# Patient Record
Sex: Female | Born: 1940
Health system: Southern US, Community
[De-identification: ages and names within clinical notes are randomized; demographics above are authoritative.]

## PROBLEM LIST (undated history)

## (undated) DIAGNOSIS — Z1211 Encounter for screening for malignant neoplasm of colon: Secondary | ICD-10-CM

## (undated) DIAGNOSIS — Z1389 Encounter for screening for other disorder: Secondary | ICD-10-CM

## (undated) DIAGNOSIS — K649 Unspecified hemorrhoids: Secondary | ICD-10-CM

## (undated) DIAGNOSIS — I519 Heart disease, unspecified: Secondary | ICD-10-CM

## (undated) DIAGNOSIS — M1990 Unspecified osteoarthritis, unspecified site: Secondary | ICD-10-CM

## (undated) DIAGNOSIS — Z87891 Personal history of nicotine dependence: Secondary | ICD-10-CM

## (undated) DIAGNOSIS — G4730 Sleep apnea, unspecified: Secondary | ICD-10-CM

## (undated) DIAGNOSIS — Z800 Family history of malignant neoplasm of digestive organs: Secondary | ICD-10-CM

## (undated) DIAGNOSIS — M810 Age-related osteoporosis without current pathological fracture: Secondary | ICD-10-CM

## (undated) DIAGNOSIS — E669 Obesity, unspecified: Secondary | ICD-10-CM

## (undated) DIAGNOSIS — K449 Diaphragmatic hernia without obstruction or gangrene: Secondary | ICD-10-CM

## (undated) DIAGNOSIS — I4892 Unspecified atrial flutter: Secondary | ICD-10-CM

## (undated) DIAGNOSIS — N6019 Diffuse cystic mastopathy of unspecified breast: Secondary | ICD-10-CM

## (undated) DIAGNOSIS — Z1239 Encounter for other screening for malignant neoplasm of breast: Secondary | ICD-10-CM

## (undated) DIAGNOSIS — I10 Essential (primary) hypertension: Secondary | ICD-10-CM

## (undated) DIAGNOSIS — E785 Hyperlipidemia, unspecified: Secondary | ICD-10-CM

## (undated) DIAGNOSIS — I4891 Unspecified atrial fibrillation: Secondary | ICD-10-CM

## (undated) HISTORY — DX: Unspecified hemorrhoids: K64.9

## (undated) HISTORY — DX: Encounter for other screening for malignant neoplasm of breast: Z12.39

## (undated) HISTORY — PX: COLONOSCOPY: SHX174

## (undated) HISTORY — DX: Encounter for screening for other disorder: Z13.89

## (undated) HISTORY — PX: CHOLECYSTECTOMY: SHX55

## (undated) HISTORY — DX: Sleep apnea, unspecified: G47.30

## (undated) HISTORY — DX: Hyperlipidemia, unspecified: E78.5

## (undated) HISTORY — PX: ABDOMINAL HYSTERECTOMY: SHX81

## (undated) HISTORY — PX: CARDIOVERSION: SHX1299

## (undated) HISTORY — DX: Personal history of nicotine dependence: Z87.891

## (undated) HISTORY — DX: Family history of malignant neoplasm of digestive organs: Z80.0

## (undated) HISTORY — DX: Unspecified atrial flutter: I48.92

## (undated) HISTORY — DX: Age-related osteoporosis without current pathological fracture: M81.0

## (undated) HISTORY — DX: Unspecified atrial fibrillation: I48.91

## (undated) HISTORY — DX: Diffuse cystic mastopathy of unspecified breast: N60.19

## (undated) HISTORY — DX: Obesity, unspecified: E66.9

## (undated) HISTORY — DX: Unspecified osteoarthritis, unspecified site: M19.90

## (undated) HISTORY — DX: Essential (primary) hypertension: I10

## (undated) HISTORY — DX: Encounter for screening for malignant neoplasm of colon: Z12.11

## (undated) HISTORY — PX: INSERT / REPLACE / REMOVE PACEMAKER: SUR710

---

## 1972-05-15 HISTORY — PX: SALPINGOOPHORECTOMY: SHX82

## 2000-05-15 DIAGNOSIS — I10 Essential (primary) hypertension: Secondary | ICD-10-CM

## 2000-05-15 HISTORY — DX: Essential (primary) hypertension: I10

## 2004-06-27 ENCOUNTER — Ambulatory Visit: Payer: Self-pay | Admitting: General Surgery

## 2004-10-05 ENCOUNTER — Ambulatory Visit: Payer: Self-pay | Admitting: Internal Medicine

## 2005-07-18 ENCOUNTER — Ambulatory Visit: Payer: Self-pay | Admitting: General Surgery

## 2006-07-19 ENCOUNTER — Ambulatory Visit: Payer: Self-pay | Admitting: General Surgery

## 2007-05-16 DIAGNOSIS — I4892 Unspecified atrial flutter: Secondary | ICD-10-CM

## 2007-05-16 DIAGNOSIS — K649 Unspecified hemorrhoids: Secondary | ICD-10-CM

## 2007-05-16 HISTORY — DX: Unspecified hemorrhoids: K64.9

## 2007-05-16 HISTORY — DX: Unspecified atrial flutter: I48.92

## 2007-07-09 ENCOUNTER — Ambulatory Visit: Payer: Self-pay | Admitting: General Surgery

## 2007-07-09 ENCOUNTER — Other Ambulatory Visit: Payer: Self-pay

## 2007-07-10 ENCOUNTER — Ambulatory Visit: Payer: Self-pay | Admitting: Cardiology

## 2007-07-10 ENCOUNTER — Ambulatory Visit: Payer: Self-pay | Admitting: Family Medicine

## 2007-07-22 ENCOUNTER — Ambulatory Visit: Payer: Self-pay | Admitting: General Surgery

## 2007-08-30 ENCOUNTER — Ambulatory Visit: Payer: Self-pay | Admitting: General Surgery

## 2007-09-06 ENCOUNTER — Ambulatory Visit: Payer: Self-pay | Admitting: General Surgery

## 2007-09-06 HISTORY — PX: HEMORRHOID SURGERY: SHX153

## 2007-12-18 ENCOUNTER — Inpatient Hospital Stay: Payer: Self-pay | Admitting: Internal Medicine

## 2007-12-18 ENCOUNTER — Other Ambulatory Visit: Payer: Self-pay

## 2007-12-19 ENCOUNTER — Other Ambulatory Visit: Payer: Self-pay

## 2008-01-29 ENCOUNTER — Ambulatory Visit: Payer: Self-pay | Admitting: Internal Medicine

## 2008-01-29 ENCOUNTER — Other Ambulatory Visit: Payer: Self-pay

## 2008-05-28 ENCOUNTER — Ambulatory Visit: Payer: Self-pay | Admitting: Internal Medicine

## 2008-07-22 ENCOUNTER — Ambulatory Visit: Payer: Self-pay | Admitting: General Surgery

## 2008-10-09 ENCOUNTER — Ambulatory Visit: Payer: Self-pay | Admitting: General Surgery

## 2009-07-23 ENCOUNTER — Ambulatory Visit: Payer: Self-pay | Admitting: General Surgery

## 2010-07-25 ENCOUNTER — Ambulatory Visit: Payer: Self-pay | Admitting: General Surgery

## 2011-05-23 DIAGNOSIS — I10 Essential (primary) hypertension: Secondary | ICD-10-CM | POA: Diagnosis not present

## 2011-05-23 DIAGNOSIS — Z0000 Encounter for general adult medical examination without abnormal findings: Secondary | ICD-10-CM | POA: Diagnosis not present

## 2011-05-23 DIAGNOSIS — K219 Gastro-esophageal reflux disease without esophagitis: Secondary | ICD-10-CM | POA: Diagnosis not present

## 2011-05-23 DIAGNOSIS — N393 Stress incontinence (female) (male): Secondary | ICD-10-CM | POA: Diagnosis not present

## 2011-05-23 DIAGNOSIS — M159 Polyosteoarthritis, unspecified: Secondary | ICD-10-CM | POA: Diagnosis not present

## 2011-05-24 DIAGNOSIS — G4710 Hypersomnia, unspecified: Secondary | ICD-10-CM | POA: Diagnosis not present

## 2011-05-24 DIAGNOSIS — G4730 Sleep apnea, unspecified: Secondary | ICD-10-CM | POA: Diagnosis not present

## 2011-05-31 DIAGNOSIS — G4710 Hypersomnia, unspecified: Secondary | ICD-10-CM | POA: Diagnosis not present

## 2011-05-31 DIAGNOSIS — G4720 Circadian rhythm sleep disorder, unspecified type: Secondary | ICD-10-CM | POA: Diagnosis not present

## 2011-05-31 DIAGNOSIS — G4730 Sleep apnea, unspecified: Secondary | ICD-10-CM | POA: Diagnosis not present

## 2011-05-31 DIAGNOSIS — R0602 Shortness of breath: Secondary | ICD-10-CM | POA: Diagnosis not present

## 2011-06-08 DIAGNOSIS — G4710 Hypersomnia, unspecified: Secondary | ICD-10-CM | POA: Diagnosis not present

## 2011-06-08 DIAGNOSIS — G4720 Circadian rhythm sleep disorder, unspecified type: Secondary | ICD-10-CM | POA: Diagnosis not present

## 2011-06-22 DIAGNOSIS — G4730 Sleep apnea, unspecified: Secondary | ICD-10-CM | POA: Diagnosis not present

## 2011-06-22 DIAGNOSIS — G4720 Circadian rhythm sleep disorder, unspecified type: Secondary | ICD-10-CM | POA: Diagnosis not present

## 2011-06-22 DIAGNOSIS — R0602 Shortness of breath: Secondary | ICD-10-CM | POA: Diagnosis not present

## 2011-06-28 DIAGNOSIS — G4730 Sleep apnea, unspecified: Secondary | ICD-10-CM | POA: Diagnosis not present

## 2011-06-28 DIAGNOSIS — G4720 Circadian rhythm sleep disorder, unspecified type: Secondary | ICD-10-CM | POA: Diagnosis not present

## 2011-06-28 DIAGNOSIS — G4710 Hypersomnia, unspecified: Secondary | ICD-10-CM | POA: Diagnosis not present

## 2011-07-26 ENCOUNTER — Ambulatory Visit: Payer: Self-pay | Admitting: General Surgery

## 2011-07-26 DIAGNOSIS — Z1231 Encounter for screening mammogram for malignant neoplasm of breast: Secondary | ICD-10-CM | POA: Diagnosis not present

## 2011-08-03 DIAGNOSIS — Z800 Family history of malignant neoplasm of digestive organs: Secondary | ICD-10-CM | POA: Diagnosis not present

## 2011-08-03 DIAGNOSIS — N6019 Diffuse cystic mastopathy of unspecified breast: Secondary | ICD-10-CM | POA: Diagnosis not present

## 2011-08-30 DIAGNOSIS — G4730 Sleep apnea, unspecified: Secondary | ICD-10-CM | POA: Diagnosis not present

## 2011-08-30 DIAGNOSIS — G4710 Hypersomnia, unspecified: Secondary | ICD-10-CM | POA: Diagnosis not present

## 2011-09-05 DIAGNOSIS — I10 Essential (primary) hypertension: Secondary | ICD-10-CM | POA: Diagnosis not present

## 2011-09-05 DIAGNOSIS — I059 Rheumatic mitral valve disease, unspecified: Secondary | ICD-10-CM | POA: Diagnosis not present

## 2011-09-05 DIAGNOSIS — I495 Sick sinus syndrome: Secondary | ICD-10-CM | POA: Diagnosis not present

## 2011-09-13 HISTORY — PX: PACEMAKER INSERTION: SHX728

## 2011-09-14 DIAGNOSIS — I059 Rheumatic mitral valve disease, unspecified: Secondary | ICD-10-CM | POA: Diagnosis not present

## 2011-09-14 DIAGNOSIS — R0602 Shortness of breath: Secondary | ICD-10-CM | POA: Diagnosis not present

## 2011-09-14 DIAGNOSIS — I209 Angina pectoris, unspecified: Secondary | ICD-10-CM | POA: Diagnosis not present

## 2011-09-21 DIAGNOSIS — E782 Mixed hyperlipidemia: Secondary | ICD-10-CM | POA: Diagnosis not present

## 2011-09-21 DIAGNOSIS — I495 Sick sinus syndrome: Secondary | ICD-10-CM | POA: Diagnosis not present

## 2011-09-21 DIAGNOSIS — I10 Essential (primary) hypertension: Secondary | ICD-10-CM | POA: Diagnosis not present

## 2011-09-21 DIAGNOSIS — G4730 Sleep apnea, unspecified: Secondary | ICD-10-CM | POA: Diagnosis not present

## 2011-09-22 ENCOUNTER — Ambulatory Visit: Payer: Self-pay | Admitting: Cardiology

## 2011-09-22 DIAGNOSIS — I495 Sick sinus syndrome: Secondary | ICD-10-CM | POA: Diagnosis not present

## 2011-09-22 DIAGNOSIS — R0602 Shortness of breath: Secondary | ICD-10-CM | POA: Diagnosis not present

## 2011-09-22 DIAGNOSIS — R918 Other nonspecific abnormal finding of lung field: Secondary | ICD-10-CM | POA: Diagnosis not present

## 2011-09-22 DIAGNOSIS — Z01812 Encounter for preprocedural laboratory examination: Secondary | ICD-10-CM | POA: Diagnosis not present

## 2011-09-22 DIAGNOSIS — Z01810 Encounter for preprocedural cardiovascular examination: Secondary | ICD-10-CM | POA: Diagnosis not present

## 2011-09-22 DIAGNOSIS — Z01811 Encounter for preprocedural respiratory examination: Secondary | ICD-10-CM | POA: Diagnosis not present

## 2011-09-22 LAB — BASIC METABOLIC PANEL
Calcium, Total: 8.9 mg/dL (ref 8.5–10.1)
Chloride: 108 mmol/L — ABNORMAL HIGH (ref 98–107)
Creatinine: 1.1 mg/dL (ref 0.60–1.30)
EGFR (Non-African Amer.): 52 — ABNORMAL LOW
Osmolality: 279 (ref 275–301)
Potassium: 4.1 mmol/L (ref 3.5–5.1)

## 2011-09-22 LAB — CBC WITH DIFFERENTIAL/PLATELET
Basophil #: 0.1 10*3/uL (ref 0.0–0.1)
Eosinophil #: 0.2 10*3/uL (ref 0.0–0.7)
Eosinophil %: 2.4 %
HCT: 37.6 % (ref 35.0–47.0)
HGB: 12.5 g/dL (ref 12.0–16.0)
Lymphocyte #: 2.2 10*3/uL (ref 1.0–3.6)
Lymphocyte %: 30.2 %
MCH: 29.2 pg (ref 26.0–34.0)
MCHC: 33.1 g/dL (ref 32.0–36.0)
Monocyte #: 0.6 x10 3/mm (ref 0.2–0.9)
Neutrophil #: 4.3 10*3/uL (ref 1.4–6.5)
Platelet: 171 10*3/uL (ref 150–440)
RDW: 14.5 % (ref 11.5–14.5)
WBC: 7.3 10*3/uL (ref 3.6–11.0)

## 2011-09-22 LAB — PROTIME-INR: INR: 0.8

## 2011-09-22 LAB — APTT: Activated PTT: 32.9 secs (ref 23.6–35.9)

## 2011-09-25 DIAGNOSIS — G4730 Sleep apnea, unspecified: Secondary | ICD-10-CM | POA: Diagnosis not present

## 2011-09-25 DIAGNOSIS — Z79899 Other long term (current) drug therapy: Secondary | ICD-10-CM | POA: Diagnosis not present

## 2011-09-25 DIAGNOSIS — I10 Essential (primary) hypertension: Secondary | ICD-10-CM | POA: Diagnosis not present

## 2011-09-25 DIAGNOSIS — I495 Sick sinus syndrome: Secondary | ICD-10-CM | POA: Diagnosis not present

## 2011-09-25 DIAGNOSIS — E785 Hyperlipidemia, unspecified: Secondary | ICD-10-CM | POA: Diagnosis not present

## 2011-09-25 DIAGNOSIS — I4891 Unspecified atrial fibrillation: Secondary | ICD-10-CM | POA: Diagnosis not present

## 2011-09-25 DIAGNOSIS — Z7982 Long term (current) use of aspirin: Secondary | ICD-10-CM | POA: Diagnosis not present

## 2011-09-26 ENCOUNTER — Ambulatory Visit: Payer: Self-pay | Admitting: Cardiology

## 2011-09-26 DIAGNOSIS — I10 Essential (primary) hypertension: Secondary | ICD-10-CM | POA: Diagnosis not present

## 2011-09-26 DIAGNOSIS — R918 Other nonspecific abnormal finding of lung field: Secondary | ICD-10-CM | POA: Diagnosis not present

## 2011-09-26 DIAGNOSIS — Z79899 Other long term (current) drug therapy: Secondary | ICD-10-CM | POA: Diagnosis not present

## 2011-09-26 DIAGNOSIS — G4730 Sleep apnea, unspecified: Secondary | ICD-10-CM | POA: Diagnosis not present

## 2011-09-26 DIAGNOSIS — Z7982 Long term (current) use of aspirin: Secondary | ICD-10-CM | POA: Diagnosis not present

## 2011-09-26 DIAGNOSIS — I4892 Unspecified atrial flutter: Secondary | ICD-10-CM | POA: Diagnosis not present

## 2011-09-26 DIAGNOSIS — I495 Sick sinus syndrome: Secondary | ICD-10-CM | POA: Diagnosis not present

## 2011-09-26 DIAGNOSIS — I4891 Unspecified atrial fibrillation: Secondary | ICD-10-CM | POA: Diagnosis not present

## 2011-09-27 DIAGNOSIS — I10 Essential (primary) hypertension: Secondary | ICD-10-CM | POA: Diagnosis not present

## 2011-09-27 DIAGNOSIS — Z7982 Long term (current) use of aspirin: Secondary | ICD-10-CM | POA: Diagnosis not present

## 2011-09-27 DIAGNOSIS — Z79899 Other long term (current) drug therapy: Secondary | ICD-10-CM | POA: Diagnosis not present

## 2011-09-27 DIAGNOSIS — I495 Sick sinus syndrome: Secondary | ICD-10-CM | POA: Diagnosis not present

## 2011-09-27 DIAGNOSIS — I4891 Unspecified atrial fibrillation: Secondary | ICD-10-CM | POA: Diagnosis not present

## 2011-09-27 DIAGNOSIS — G4730 Sleep apnea, unspecified: Secondary | ICD-10-CM | POA: Diagnosis not present

## 2011-10-11 DIAGNOSIS — I495 Sick sinus syndrome: Secondary | ICD-10-CM | POA: Diagnosis not present

## 2011-10-11 DIAGNOSIS — I10 Essential (primary) hypertension: Secondary | ICD-10-CM | POA: Diagnosis not present

## 2011-10-11 DIAGNOSIS — G4730 Sleep apnea, unspecified: Secondary | ICD-10-CM | POA: Diagnosis not present

## 2011-10-11 DIAGNOSIS — E782 Mixed hyperlipidemia: Secondary | ICD-10-CM | POA: Diagnosis not present

## 2011-10-18 DIAGNOSIS — G479 Sleep disorder, unspecified: Secondary | ICD-10-CM | POA: Diagnosis not present

## 2011-10-18 DIAGNOSIS — G4720 Circadian rhythm sleep disorder, unspecified type: Secondary | ICD-10-CM | POA: Diagnosis not present

## 2011-10-18 DIAGNOSIS — G4730 Sleep apnea, unspecified: Secondary | ICD-10-CM | POA: Diagnosis not present

## 2011-10-26 DIAGNOSIS — I495 Sick sinus syndrome: Secondary | ICD-10-CM | POA: Diagnosis not present

## 2011-10-26 DIAGNOSIS — I10 Essential (primary) hypertension: Secondary | ICD-10-CM | POA: Diagnosis not present

## 2011-11-20 DIAGNOSIS — G479 Sleep disorder, unspecified: Secondary | ICD-10-CM | POA: Diagnosis not present

## 2011-11-20 DIAGNOSIS — M899 Disorder of bone, unspecified: Secondary | ICD-10-CM | POA: Diagnosis not present

## 2011-11-20 DIAGNOSIS — K219 Gastro-esophageal reflux disease without esophagitis: Secondary | ICD-10-CM | POA: Diagnosis not present

## 2011-11-20 DIAGNOSIS — I10 Essential (primary) hypertension: Secondary | ICD-10-CM | POA: Diagnosis not present

## 2011-11-20 DIAGNOSIS — F5102 Adjustment insomnia: Secondary | ICD-10-CM | POA: Diagnosis not present

## 2011-11-20 DIAGNOSIS — I4891 Unspecified atrial fibrillation: Secondary | ICD-10-CM | POA: Diagnosis not present

## 2011-12-06 DIAGNOSIS — G4730 Sleep apnea, unspecified: Secondary | ICD-10-CM | POA: Diagnosis not present

## 2011-12-06 DIAGNOSIS — G4710 Hypersomnia, unspecified: Secondary | ICD-10-CM | POA: Diagnosis not present

## 2012-01-09 DIAGNOSIS — I10 Essential (primary) hypertension: Secondary | ICD-10-CM | POA: Diagnosis not present

## 2012-01-09 DIAGNOSIS — M899 Disorder of bone, unspecified: Secondary | ICD-10-CM | POA: Diagnosis not present

## 2012-01-09 DIAGNOSIS — F5102 Adjustment insomnia: Secondary | ICD-10-CM | POA: Diagnosis not present

## 2012-01-09 DIAGNOSIS — I4891 Unspecified atrial fibrillation: Secondary | ICD-10-CM | POA: Diagnosis not present

## 2012-01-09 DIAGNOSIS — G479 Sleep disorder, unspecified: Secondary | ICD-10-CM | POA: Diagnosis not present

## 2012-01-09 DIAGNOSIS — K219 Gastro-esophageal reflux disease without esophagitis: Secondary | ICD-10-CM | POA: Diagnosis not present

## 2012-01-09 DIAGNOSIS — M949 Disorder of cartilage, unspecified: Secondary | ICD-10-CM | POA: Diagnosis not present

## 2012-01-24 DIAGNOSIS — J309 Allergic rhinitis, unspecified: Secondary | ICD-10-CM | POA: Diagnosis not present

## 2012-01-24 DIAGNOSIS — G4730 Sleep apnea, unspecified: Secondary | ICD-10-CM | POA: Diagnosis not present

## 2012-01-24 DIAGNOSIS — G4720 Circadian rhythm sleep disorder, unspecified type: Secondary | ICD-10-CM | POA: Diagnosis not present

## 2012-02-05 DIAGNOSIS — E782 Mixed hyperlipidemia: Secondary | ICD-10-CM | POA: Diagnosis not present

## 2012-02-05 DIAGNOSIS — I059 Rheumatic mitral valve disease, unspecified: Secondary | ICD-10-CM | POA: Diagnosis not present

## 2012-02-05 DIAGNOSIS — I4891 Unspecified atrial fibrillation: Secondary | ICD-10-CM | POA: Diagnosis not present

## 2012-02-05 DIAGNOSIS — I10 Essential (primary) hypertension: Secondary | ICD-10-CM | POA: Diagnosis not present

## 2012-02-22 DIAGNOSIS — Z23 Encounter for immunization: Secondary | ICD-10-CM | POA: Diagnosis not present

## 2012-03-06 DIAGNOSIS — G4730 Sleep apnea, unspecified: Secondary | ICD-10-CM | POA: Diagnosis not present

## 2012-04-23 DIAGNOSIS — I495 Sick sinus syndrome: Secondary | ICD-10-CM | POA: Diagnosis not present

## 2012-04-25 DIAGNOSIS — I4892 Unspecified atrial flutter: Secondary | ICD-10-CM | POA: Diagnosis not present

## 2012-04-25 DIAGNOSIS — I10 Essential (primary) hypertension: Secondary | ICD-10-CM | POA: Diagnosis not present

## 2012-04-25 DIAGNOSIS — R040 Epistaxis: Secondary | ICD-10-CM | POA: Diagnosis not present

## 2012-04-25 DIAGNOSIS — K219 Gastro-esophageal reflux disease without esophagitis: Secondary | ICD-10-CM | POA: Diagnosis not present

## 2012-05-16 DIAGNOSIS — R040 Epistaxis: Secondary | ICD-10-CM | POA: Diagnosis not present

## 2012-05-28 DIAGNOSIS — I10 Essential (primary) hypertension: Secondary | ICD-10-CM | POA: Diagnosis not present

## 2012-05-28 DIAGNOSIS — Z79899 Other long term (current) drug therapy: Secondary | ICD-10-CM | POA: Diagnosis not present

## 2012-05-31 ENCOUNTER — Ambulatory Visit: Payer: Self-pay | Admitting: Internal Medicine

## 2012-05-31 DIAGNOSIS — Z713 Dietary counseling and surveillance: Secondary | ICD-10-CM | POA: Diagnosis not present

## 2012-05-31 DIAGNOSIS — G4730 Sleep apnea, unspecified: Secondary | ICD-10-CM | POA: Diagnosis not present

## 2012-05-31 DIAGNOSIS — E669 Obesity, unspecified: Secondary | ICD-10-CM | POA: Diagnosis not present

## 2012-05-31 DIAGNOSIS — Z950 Presence of cardiac pacemaker: Secondary | ICD-10-CM | POA: Diagnosis not present

## 2012-05-31 DIAGNOSIS — I10 Essential (primary) hypertension: Secondary | ICD-10-CM | POA: Diagnosis not present

## 2012-06-06 DIAGNOSIS — G479 Sleep disorder, unspecified: Secondary | ICD-10-CM | POA: Diagnosis not present

## 2012-06-06 DIAGNOSIS — K219 Gastro-esophageal reflux disease without esophagitis: Secondary | ICD-10-CM | POA: Diagnosis not present

## 2012-06-06 DIAGNOSIS — I10 Essential (primary) hypertension: Secondary | ICD-10-CM | POA: Diagnosis not present

## 2012-06-06 DIAGNOSIS — F5102 Adjustment insomnia: Secondary | ICD-10-CM | POA: Diagnosis not present

## 2012-06-06 DIAGNOSIS — I4891 Unspecified atrial fibrillation: Secondary | ICD-10-CM | POA: Diagnosis not present

## 2012-06-06 DIAGNOSIS — M899 Disorder of bone, unspecified: Secondary | ICD-10-CM | POA: Diagnosis not present

## 2012-06-06 DIAGNOSIS — M949 Disorder of cartilage, unspecified: Secondary | ICD-10-CM | POA: Diagnosis not present

## 2012-06-15 ENCOUNTER — Ambulatory Visit: Payer: Self-pay | Admitting: Internal Medicine

## 2012-06-22 ENCOUNTER — Encounter: Payer: Self-pay | Admitting: General Surgery

## 2012-07-05 DIAGNOSIS — I4891 Unspecified atrial fibrillation: Secondary | ICD-10-CM | POA: Diagnosis not present

## 2012-07-10 DIAGNOSIS — G4710 Hypersomnia, unspecified: Secondary | ICD-10-CM | POA: Diagnosis not present

## 2012-07-15 DIAGNOSIS — E2839 Other primary ovarian failure: Secondary | ICD-10-CM | POA: Diagnosis not present

## 2012-07-26 ENCOUNTER — Ambulatory Visit: Payer: Self-pay | Admitting: General Surgery

## 2012-07-26 DIAGNOSIS — Z1231 Encounter for screening mammogram for malignant neoplasm of breast: Secondary | ICD-10-CM | POA: Diagnosis not present

## 2012-08-01 DIAGNOSIS — Z006 Encounter for examination for normal comparison and control in clinical research program: Secondary | ICD-10-CM | POA: Diagnosis not present

## 2012-08-01 DIAGNOSIS — G4730 Sleep apnea, unspecified: Secondary | ICD-10-CM | POA: Diagnosis not present

## 2012-08-01 DIAGNOSIS — G4710 Hypersomnia, unspecified: Secondary | ICD-10-CM | POA: Diagnosis not present

## 2012-08-01 DIAGNOSIS — I279 Pulmonary heart disease, unspecified: Secondary | ICD-10-CM | POA: Diagnosis not present

## 2012-08-01 DIAGNOSIS — R040 Epistaxis: Secondary | ICD-10-CM | POA: Diagnosis not present

## 2012-08-01 DIAGNOSIS — R05 Cough: Secondary | ICD-10-CM | POA: Diagnosis not present

## 2012-08-01 DIAGNOSIS — I10 Essential (primary) hypertension: Secondary | ICD-10-CM | POA: Diagnosis not present

## 2012-08-06 ENCOUNTER — Ambulatory Visit (INDEPENDENT_AMBULATORY_CARE_PROVIDER_SITE_OTHER): Payer: Medicare Other | Admitting: General Surgery

## 2012-08-06 ENCOUNTER — Encounter: Payer: Self-pay | Admitting: General Surgery

## 2012-08-06 VITALS — BP 120/74 | HR 68 | Resp 14 | Ht 66.0 in | Wt 212.0 lb

## 2012-08-06 DIAGNOSIS — N6019 Diffuse cystic mastopathy of unspecified breast: Secondary | ICD-10-CM | POA: Diagnosis not present

## 2012-08-06 DIAGNOSIS — Z800 Family history of malignant neoplasm of digestive organs: Secondary | ICD-10-CM

## 2012-08-06 DIAGNOSIS — Z1231 Encounter for screening mammogram for malignant neoplasm of breast: Secondary | ICD-10-CM | POA: Diagnosis not present

## 2012-08-06 NOTE — Progress Notes (Signed)
Patient ID: Cathy Curtis, female   DOB: 23-Jul-1940, 72 y.o.   MRN: [id number]  Chief Complaint  Patient presents with  . Follow-up    bilateral mammogram     HPI Cathy Curtis is a 72 y.o. female.  The patient present for a follow up bilateral screening mammogram. Most recent mammogram was done on 07/26/12 at [facility] with a birad category 2. Patient has a history of fibrocystic breast disease. Patient has a strong family history of colon cancer including her father and 2 sisters. Patient denies any problems at this time with her breasts.   HPI  Past Medical History  Diagnosis Date  . Unspecified essential hypertension 2002  . Personal history of tobacco use, presenting hazards to health   . Diffuse cystic mastopathy     FCD  . Osteoporosis   . Sleep apnea     admits to c-pap  . Family history of malignant neoplasm of gastrointestinal tract   . Breast screening, unspecified   . Special screening for malignant neoplasms, colon   . Obesity, unspecified   . Screening for obesity   . Atrial flutter 2009  . Hemorrhoids 2009    resolved    Past Surgical History  Procedure Laterality Date  . Cardioversion  2009, 2010  . Hemorrhoid surgery  09-06-2007    stapled  . Salpingoophorectomy  1974  . Abdominal hysterectomy    . Colonoscopy  2010    Dr. Evette Cristal, [facility]  . Pacemaker insertion Left 09/2011    Family History  Problem Relation Age of Onset  . Colon cancer Father   . Colon cancer Sister   . Colon cancer Sister     Social History History  Substance Use Topics  . Smoking status: Former Smoker -- 1.00 packs/day for 20 years    Types: Cigarettes    Quit date: 05/16/1991  . Smokeless tobacco: Never Used  . Alcohol Use: Yes     Comment: occasionally    Allergies  Allergen Reactions  . Prevacid (Lansoprazole) Other (See Comments)    whelps    Current Outpatient Prescriptions  Medication Sig Dispense Refill  . amLODipine (NORVASC) 5 MG tablet Take 5 mg by mouth  daily.      Marland Kitchen aspirin 325 MG tablet Take 325 mg by mouth daily.      Marland Kitchen CALCIUM-VITAMIN D PO Take by mouth.      . Cholecalciferol (VITAMIN D-3) 1000 UNITS CAPS Take by mouth daily.      Marland Kitchen estradiol (ESTRACE) 0.5 MG tablet Take 0.5 mg by mouth every other day.      Marland Kitchen omeprazole (PRILOSEC) 20 MG capsule Take 20 mg by mouth daily.      . solifenacin (VESICARE) 5 MG tablet Take 10 mg by mouth daily.      Marland Kitchen zolpidem (AMBIEN CR) 12.5 MG CR tablet Take 12.5 mg by mouth at bedtime as needed for sleep.       No current facility-administered medications for this visit.    Review of Systems Review of Systems  Constitutional: Negative.   Respiratory: Negative.   Cardiovascular: Negative.     Blood pressure 120/74, pulse 68, resp. rate 14, height 5\' 6"  (1.676 m), weight 212 lb (96.163 kg).  Physical Exam Physical Exam  Constitutional: She appears well-developed and well-nourished.  Neck: Normal range of motion.  Cardiovascular: Normal rate and normal heart sounds.  An irregularly irregular rhythm present.  Pulmonary/Chest: Effort normal and breath sounds normal. Right breast exhibits  no inverted nipple, no mass, no nipple discharge, no skin change and no tenderness. Left breast exhibits no inverted nipple, no mass, no nipple discharge, no skin change and no tenderness.  Abdominal: Soft. Bowel sounds are normal.    Data Reviewed Mammogram reviewed Birads2   Assessment    Stable exam    Plan    Mammogram in one year , colonoscopy in one year.       Greta Doom F 08/06/2012, 12:06 PM

## 2012-08-08 ENCOUNTER — Encounter: Payer: Self-pay | Admitting: General Surgery

## 2012-08-08 DIAGNOSIS — I10 Essential (primary) hypertension: Secondary | ICD-10-CM | POA: Diagnosis not present

## 2012-08-08 DIAGNOSIS — I4891 Unspecified atrial fibrillation: Secondary | ICD-10-CM | POA: Diagnosis not present

## 2012-08-08 DIAGNOSIS — R5381 Other malaise: Secondary | ICD-10-CM | POA: Diagnosis not present

## 2012-08-22 DIAGNOSIS — I4891 Unspecified atrial fibrillation: Secondary | ICD-10-CM | POA: Diagnosis not present

## 2012-08-22 DIAGNOSIS — I10 Essential (primary) hypertension: Secondary | ICD-10-CM | POA: Diagnosis not present

## 2012-08-22 DIAGNOSIS — E785 Hyperlipidemia, unspecified: Secondary | ICD-10-CM | POA: Diagnosis not present

## 2012-08-22 DIAGNOSIS — G4730 Sleep apnea, unspecified: Secondary | ICD-10-CM | POA: Diagnosis not present

## 2012-08-27 DIAGNOSIS — H2510 Age-related nuclear cataract, unspecified eye: Secondary | ICD-10-CM | POA: Diagnosis not present

## 2012-10-15 DIAGNOSIS — N3941 Urge incontinence: Secondary | ICD-10-CM | POA: Diagnosis not present

## 2012-10-15 DIAGNOSIS — E559 Vitamin D deficiency, unspecified: Secondary | ICD-10-CM | POA: Diagnosis not present

## 2012-10-15 DIAGNOSIS — J309 Allergic rhinitis, unspecified: Secondary | ICD-10-CM | POA: Diagnosis not present

## 2012-10-15 DIAGNOSIS — R350 Frequency of micturition: Secondary | ICD-10-CM | POA: Diagnosis not present

## 2012-10-15 DIAGNOSIS — K219 Gastro-esophageal reflux disease without esophagitis: Secondary | ICD-10-CM | POA: Diagnosis not present

## 2012-10-15 DIAGNOSIS — I10 Essential (primary) hypertension: Secondary | ICD-10-CM | POA: Diagnosis not present

## 2012-10-15 DIAGNOSIS — R300 Dysuria: Secondary | ICD-10-CM | POA: Diagnosis not present

## 2012-10-15 DIAGNOSIS — R5381 Other malaise: Secondary | ICD-10-CM | POA: Diagnosis not present

## 2012-10-16 DIAGNOSIS — G4730 Sleep apnea, unspecified: Secondary | ICD-10-CM | POA: Diagnosis not present

## 2012-10-16 DIAGNOSIS — G4710 Hypersomnia, unspecified: Secondary | ICD-10-CM | POA: Diagnosis not present

## 2013-02-04 DIAGNOSIS — R0602 Shortness of breath: Secondary | ICD-10-CM | POA: Diagnosis not present

## 2013-02-04 DIAGNOSIS — I279 Pulmonary heart disease, unspecified: Secondary | ICD-10-CM | POA: Diagnosis not present

## 2013-02-04 DIAGNOSIS — G4710 Hypersomnia, unspecified: Secondary | ICD-10-CM | POA: Diagnosis not present

## 2013-02-12 DIAGNOSIS — J309 Allergic rhinitis, unspecified: Secondary | ICD-10-CM | POA: Diagnosis not present

## 2013-02-12 DIAGNOSIS — R5381 Other malaise: Secondary | ICD-10-CM | POA: Diagnosis not present

## 2013-02-12 DIAGNOSIS — E2839 Other primary ovarian failure: Secondary | ICD-10-CM | POA: Diagnosis not present

## 2013-02-12 DIAGNOSIS — I10 Essential (primary) hypertension: Secondary | ICD-10-CM | POA: Diagnosis not present

## 2013-02-12 DIAGNOSIS — R609 Edema, unspecified: Secondary | ICD-10-CM | POA: Diagnosis not present

## 2013-02-12 DIAGNOSIS — F5102 Adjustment insomnia: Secondary | ICD-10-CM | POA: Diagnosis not present

## 2013-02-13 DIAGNOSIS — Z23 Encounter for immunization: Secondary | ICD-10-CM | POA: Diagnosis not present

## 2013-03-04 DIAGNOSIS — I4891 Unspecified atrial fibrillation: Secondary | ICD-10-CM | POA: Diagnosis not present

## 2013-03-04 DIAGNOSIS — Z950 Presence of cardiac pacemaker: Secondary | ICD-10-CM | POA: Diagnosis not present

## 2013-03-04 DIAGNOSIS — E785 Hyperlipidemia, unspecified: Secondary | ICD-10-CM | POA: Diagnosis not present

## 2013-03-04 DIAGNOSIS — I10 Essential (primary) hypertension: Secondary | ICD-10-CM | POA: Diagnosis not present

## 2013-03-14 DIAGNOSIS — J309 Allergic rhinitis, unspecified: Secondary | ICD-10-CM | POA: Diagnosis not present

## 2013-03-14 DIAGNOSIS — R609 Edema, unspecified: Secondary | ICD-10-CM | POA: Diagnosis not present

## 2013-03-14 DIAGNOSIS — I10 Essential (primary) hypertension: Secondary | ICD-10-CM | POA: Diagnosis not present

## 2013-03-14 DIAGNOSIS — N393 Stress incontinence (female) (male): Secondary | ICD-10-CM | POA: Diagnosis not present

## 2013-04-09 DIAGNOSIS — R0602 Shortness of breath: Secondary | ICD-10-CM | POA: Diagnosis not present

## 2013-04-23 DIAGNOSIS — G4710 Hypersomnia, unspecified: Secondary | ICD-10-CM | POA: Diagnosis not present

## 2013-05-27 ENCOUNTER — Encounter: Payer: Self-pay | Admitting: General Surgery

## 2013-07-14 ENCOUNTER — Ambulatory Visit: Payer: Self-pay

## 2013-07-14 DIAGNOSIS — N393 Stress incontinence (female) (male): Secondary | ICD-10-CM | POA: Diagnosis not present

## 2013-07-14 DIAGNOSIS — G4730 Sleep apnea, unspecified: Secondary | ICD-10-CM | POA: Diagnosis not present

## 2013-07-14 DIAGNOSIS — I10 Essential (primary) hypertension: Secondary | ICD-10-CM | POA: Diagnosis not present

## 2013-07-14 DIAGNOSIS — G4710 Hypersomnia, unspecified: Secondary | ICD-10-CM | POA: Diagnosis not present

## 2013-07-14 DIAGNOSIS — M25569 Pain in unspecified knee: Secondary | ICD-10-CM | POA: Diagnosis not present

## 2013-07-14 DIAGNOSIS — IMO0002 Reserved for concepts with insufficient information to code with codable children: Secondary | ICD-10-CM | POA: Diagnosis not present

## 2013-07-14 DIAGNOSIS — M1710 Unilateral primary osteoarthritis, unspecified knee: Secondary | ICD-10-CM | POA: Diagnosis not present

## 2013-07-28 ENCOUNTER — Encounter: Payer: Self-pay | Admitting: General Surgery

## 2013-07-28 ENCOUNTER — Ambulatory Visit: Payer: Self-pay | Admitting: General Surgery

## 2013-07-28 DIAGNOSIS — Z1231 Encounter for screening mammogram for malignant neoplasm of breast: Secondary | ICD-10-CM | POA: Diagnosis not present

## 2013-07-30 DIAGNOSIS — G4730 Sleep apnea, unspecified: Secondary | ICD-10-CM | POA: Diagnosis not present

## 2013-07-30 DIAGNOSIS — G4710 Hypersomnia, unspecified: Secondary | ICD-10-CM | POA: Diagnosis not present

## 2013-08-05 DIAGNOSIS — G4720 Circadian rhythm sleep disorder, unspecified type: Secondary | ICD-10-CM | POA: Diagnosis not present

## 2013-08-05 DIAGNOSIS — M1710 Unilateral primary osteoarthritis, unspecified knee: Secondary | ICD-10-CM | POA: Diagnosis not present

## 2013-08-05 DIAGNOSIS — IMO0002 Reserved for concepts with insufficient information to code with codable children: Secondary | ICD-10-CM | POA: Diagnosis not present

## 2013-08-05 DIAGNOSIS — G4710 Hypersomnia, unspecified: Secondary | ICD-10-CM | POA: Diagnosis not present

## 2013-08-05 DIAGNOSIS — G4730 Sleep apnea, unspecified: Secondary | ICD-10-CM | POA: Diagnosis not present

## 2013-08-19 ENCOUNTER — Ambulatory Visit (INDEPENDENT_AMBULATORY_CARE_PROVIDER_SITE_OTHER): Payer: Medicare Other | Admitting: General Surgery

## 2013-08-19 ENCOUNTER — Encounter: Payer: Self-pay | Admitting: General Surgery

## 2013-08-19 VITALS — BP 172/70 | HR 82 | Resp 12 | Ht 66.0 in | Wt 217.0 lb

## 2013-08-19 DIAGNOSIS — I10 Essential (primary) hypertension: Secondary | ICD-10-CM | POA: Diagnosis not present

## 2013-08-19 DIAGNOSIS — Z800 Family history of malignant neoplasm of digestive organs: Secondary | ICD-10-CM

## 2013-08-19 DIAGNOSIS — N6019 Diffuse cystic mastopathy of unspecified breast: Secondary | ICD-10-CM | POA: Diagnosis not present

## 2013-08-19 DIAGNOSIS — Z1239 Encounter for other screening for malignant neoplasm of breast: Secondary | ICD-10-CM

## 2013-08-19 DIAGNOSIS — I4891 Unspecified atrial fibrillation: Secondary | ICD-10-CM | POA: Diagnosis not present

## 2013-08-19 DIAGNOSIS — Z950 Presence of cardiac pacemaker: Secondary | ICD-10-CM | POA: Diagnosis not present

## 2013-08-19 MED ORDER — POLYETHYLENE GLYCOL 3350 17 GM/SCOOP PO POWD: 1.0000 | Freq: Once | ORAL | Status: DC

## 2013-08-19 MED ORDER — POLYETHYLENE GLYCOL 3350 17 GM/SCOOP PO POWD: ORAL | Status: DC

## 2013-08-19 NOTE — Patient Instructions (Addendum)
Patient to return in 1 year with a bilateral screening mammogram. She is also to be scheduled for a colonoscopy. Continue self breast exams. Call office for any new breast issues or concerns.   Colonoscopy A colonoscopy is an exam to look at the entire large intestine (colon). This exam can help find problems such as tumors, polyps, inflammation, and areas of bleeding. The exam takes about 1 hour.  LET [facility] CARE PROVIDER KNOW ABOUT:   Any allergies you have.  All medicines you are taking, including vitamins, herbs, eye drops, creams, and over-the-counter medicines.  Previous problems you or members of your family have had with the use of anesthetics.  Any blood disorders you have.  Previous surgeries you have had.  Medical conditions you have. RISKS AND COMPLICATIONS  Generally, this is a safe procedure. However, as with any procedure, complications can occur. Possible complications include:  Bleeding.  Tearing or rupture of the colon wall.  Reaction to medicines given during the exam.  Infection (rare). BEFORE THE PROCEDURE   Ask your health care provider about changing or stopping your regular medicines.  You may be prescribed an oral bowel prep. This involves drinking a large amount of medicated liquid, starting the day before your procedure. The liquid will cause you to have multiple loose stools until your stool is almost clear or light green. This cleans out your colon in preparation for the procedure.  Do not eat or drink anything else once you have started the bowel prep, unless your health care provider tells you it is safe to do so.  Arrange for someone to drive you home after the procedure. PROCEDURE   You will be given medicine to help you relax (sedative).  You will lie on your side with your knees bent.  A long, flexible tube with a light and camera on the end (colonoscope) will be inserted through the rectum and into the colon. The camera sends video  back to a computer screen as it moves through the colon. The colonoscope also releases carbon dioxide gas to inflate the colon. This helps your health care provider see the area better.  During the exam, your health care provider may take a small tissue sample (biopsy) to be examined under a microscope if any abnormalities are found.  The exam is finished when the entire colon has been viewed. AFTER THE PROCEDURE   Do not drive for 24 hours after the exam.  You may have a small amount of blood in your stool.  You may pass moderate amounts of gas and have mild abdominal cramping or bloating. This is caused by the gas used to inflate your colon during the exam.  Ask when your test results will be ready and how you will get your results. Make sure you get your test results. Document Released: 04/28/2000 Document Revised: 02/19/2013 Document Reviewed: 01/06/2013 [facility] Patient Information 2014 [place].  Patient has been scheduled for a colonoscopy on 10-08-13 at [facility]. This patient has been asked to decrease current 325 mg aspirin to 81 mg aspirin starting one week prior to procedure.

## 2013-08-19 NOTE — Progress Notes (Signed)
Patient ID: Cathy Curtis, female   DOB: 1940/07/20, 73 y.o.   MRN: [id number]  Chief Complaint  Patient presents with  . Follow-up    mammogram and colonoscopy discussion    HPI Cathy Curtis is a 73 y.o. female who presents for a breast evaluation. The most recent mammogram was done on 07/28/13 at [facility]. Patient does not  perform regular self breast checks but does get regular mammograms done. She is also here to discuss having a colonoscopy done. Her last one was 10/09/2008. No current problems with the bowels.     HPI  Past Medical History  Diagnosis Date  . Unspecified essential hypertension 2002  . Personal history of tobacco use, presenting hazards to health   . Diffuse cystic mastopathy     FCD  . Osteoporosis   . Sleep apnea     admits to c-pap  . Family history of malignant neoplasm of gastrointestinal tract   . Breast screening, unspecified   . Special screening for malignant neoplasms, colon   . Obesity, unspecified   . Screening for obesity   . Atrial flutter 2009  . Hemorrhoids 2009    resolved  . Osteoarthritis     Past Surgical History  Procedure Laterality Date  . Cardioversion  2009, 2010  . Hemorrhoid surgery  09-06-2007    stapled  . Salpingoophorectomy  1974  . Abdominal hysterectomy    . Colonoscopy  2010    Dr. Jamal Collin, [facility]  . Pacemaker insertion Left 09/2011  . Cholecystectomy      Family History  Problem Relation Age of Onset  . Colon cancer Father   . Colon cancer Sister   . Colon cancer Sister     Social History History  Substance Use Topics  . Smoking status: Former Smoker -- 1.00 packs/day for 20 years    Types: Cigarettes    Quit date: 05/16/1991  . Smokeless tobacco: Never Used  . Alcohol Use: Yes     Comment: occasionally    Allergies  Allergen Reactions  . Prevacid [Lansoprazole] Other (See Comments)    whelps    Current Outpatient Prescriptions  Medication Sig Dispense Refill  . amLODipine (NORVASC) 5 MG tablet  Take 5 mg by mouth daily.      Marland Kitchen aspirin 325 MG tablet Take 325 mg by mouth daily.      Marland Kitchen CALCIUM-VITAMIN D PO Take by mouth.      . Cholecalciferol (VITAMIN D-3) 1000 UNITS CAPS Take by mouth daily.      . diclofenac sodium (VOLTAREN) 1 % GEL Apply topically daily.      Marland Kitchen estradiol (ESTRACE) 0.5 MG tablet Take 0.5 mg by mouth every other day.      . meloxicam (MOBIC) 7.5 MG tablet Take 7.5 mg by mouth 2 (two) times daily.      Marland Kitchen omeprazole (PRILOSEC) 20 MG capsule Take 20 mg by mouth daily.      Marland Kitchen zolpidem (AMBIEN CR) 12.5 MG CR tablet Take 12.5 mg by mouth at bedtime as needed for sleep.      . polyethylene glycol powder (GLYCOLAX/MIRALAX) powder 255 grams one bottle for colonoscopy prep  255 g  0   No current facility-administered medications for this visit.    Review of Systems Review of Systems  Constitutional: Negative.   Respiratory: Negative.   Cardiovascular: Negative.   Gastrointestinal: Negative.     Blood pressure 172/70, pulse 82, resp. rate 12, height 5\' 6"  (1.676  m), weight 217 lb (98.431 kg).  Physical Exam Physical Exam  Constitutional: She is oriented to person, place, and time. She appears well-developed and well-nourished.  Eyes: Conjunctivae are normal. No scleral icterus.  Neck: Neck supple. No thyromegaly present.  Cardiovascular: Normal rate, regular rhythm and normal heart sounds.   No murmur heard. Pulmonary/Chest: Effort normal and breath sounds normal. Right breast exhibits no inverted nipple, no mass, no nipple discharge, no skin change and no tenderness. Left breast exhibits no inverted nipple, no mass, no nipple discharge, no skin change and no tenderness.  Abdominal: Soft. Normal appearance and bowel sounds are normal. There is no hepatosplenomegaly. There is no tenderness. No hernia.  Lymphadenopathy:    She has no cervical adenopathy.    She has no axillary adenopathy.  Neurological: She is alert and oriented to person, place, and time.  Skin:  Skin is warm and dry.    Data Reviewed Mammogram-stable  Assessment    Stable exam. History of FCD. FH of colon cancer     Plan    71yr f/u with bilateral screening mammogram. Surveillance colonoscopy.    Patient has been scheduled for a colonoscopy on 10-08-13 at [facility]. This patient has been asked to decrease current 325 mg aspirin to 81 mg aspirin starting one week prior to procedure.   Ruston Fedora G 08/19/2013, 2:41 PM

## 2013-09-30 ENCOUNTER — Telehealth: Payer: Self-pay

## 2013-09-30 NOTE — Telephone Encounter (Signed)
Spoke with patient about her upcoming colonoscopy on 10/08/13. She is aware to reduce her aspirin to 81mg  one week prior. She has already pre registered with the hospital. She has had no changes in her health or medications. Patient is aware of date and all instructions.

## 2013-10-02 ENCOUNTER — Other Ambulatory Visit: Payer: Self-pay | Admitting: General Surgery

## 2013-10-02 DIAGNOSIS — Z800 Family history of malignant neoplasm of digestive organs: Secondary | ICD-10-CM

## 2013-10-08 ENCOUNTER — Ambulatory Visit: Payer: Self-pay | Admitting: General Surgery

## 2013-10-08 DIAGNOSIS — Z1211 Encounter for screening for malignant neoplasm of colon: Secondary | ICD-10-CM | POA: Diagnosis not present

## 2013-10-08 DIAGNOSIS — G4730 Sleep apnea, unspecified: Secondary | ICD-10-CM | POA: Diagnosis not present

## 2013-10-08 DIAGNOSIS — I10 Essential (primary) hypertension: Secondary | ICD-10-CM | POA: Diagnosis not present

## 2013-10-08 DIAGNOSIS — Z79899 Other long term (current) drug therapy: Secondary | ICD-10-CM | POA: Diagnosis not present

## 2013-10-08 DIAGNOSIS — Z7982 Long term (current) use of aspirin: Secondary | ICD-10-CM | POA: Diagnosis not present

## 2013-10-08 DIAGNOSIS — Z87891 Personal history of nicotine dependence: Secondary | ICD-10-CM | POA: Diagnosis not present

## 2013-10-08 DIAGNOSIS — E669 Obesity, unspecified: Secondary | ICD-10-CM | POA: Diagnosis not present

## 2013-10-08 DIAGNOSIS — Z950 Presence of cardiac pacemaker: Secondary | ICD-10-CM | POA: Diagnosis not present

## 2013-10-08 DIAGNOSIS — K5730 Diverticulosis of large intestine without perforation or abscess without bleeding: Secondary | ICD-10-CM

## 2013-10-08 DIAGNOSIS — Z800 Family history of malignant neoplasm of digestive organs: Secondary | ICD-10-CM | POA: Diagnosis not present

## 2013-10-08 DIAGNOSIS — Z888 Allergy status to other drugs, medicaments and biological substances status: Secondary | ICD-10-CM | POA: Diagnosis not present

## 2013-10-13 ENCOUNTER — Encounter: Payer: Self-pay | Admitting: General Surgery

## 2013-11-10 DIAGNOSIS — N3941 Urge incontinence: Secondary | ICD-10-CM | POA: Diagnosis not present

## 2013-11-10 DIAGNOSIS — I10 Essential (primary) hypertension: Secondary | ICD-10-CM | POA: Diagnosis not present

## 2013-11-10 DIAGNOSIS — E782 Mixed hyperlipidemia: Secondary | ICD-10-CM | POA: Diagnosis not present

## 2013-11-10 DIAGNOSIS — E559 Vitamin D deficiency, unspecified: Secondary | ICD-10-CM | POA: Diagnosis not present

## 2013-11-10 DIAGNOSIS — J309 Allergic rhinitis, unspecified: Secondary | ICD-10-CM | POA: Diagnosis not present

## 2013-11-10 DIAGNOSIS — G479 Sleep disorder, unspecified: Secondary | ICD-10-CM | POA: Diagnosis not present

## 2013-11-10 DIAGNOSIS — F5102 Adjustment insomnia: Secondary | ICD-10-CM | POA: Diagnosis not present

## 2013-11-10 DIAGNOSIS — Z0000 Encounter for general adult medical examination without abnormal findings: Secondary | ICD-10-CM | POA: Diagnosis not present

## 2013-12-07 IMAGING — MG MAM DGTL SCRN MAM NO ORDER W/CAD
1 series · 6 of 6 positions shown · non-contrast
Comparison: none

REASON FOR EXAM: SCR MAMMO NO ORDER
COMMENTS:  PLEASE SEND COPY TO DR. QUIRIJN AMAZIGH ALSO PER PT REQUEST.

PROCEDURE:     MAM - MAM DGTL SCRN MAM NO ORDER W/CAD  - July 26, 2011  [DATE]
RESULT:      No dominant masses or pathologic clustered calcifications are
demonstrated. A dense nodular parenchymal pattern is present with benign
calcification. CAD evaluation is nonfocal.

[R CC · right · 6 of 6 slices shown]
[im 1/6]
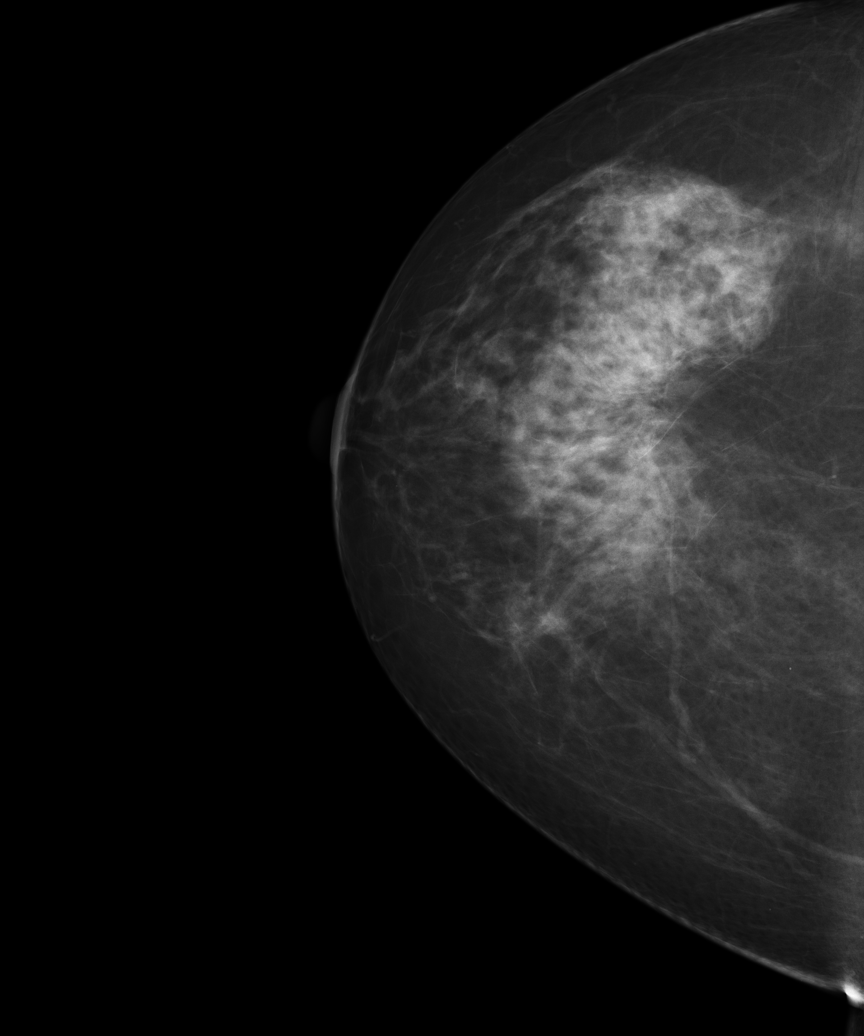
[im 2/6]
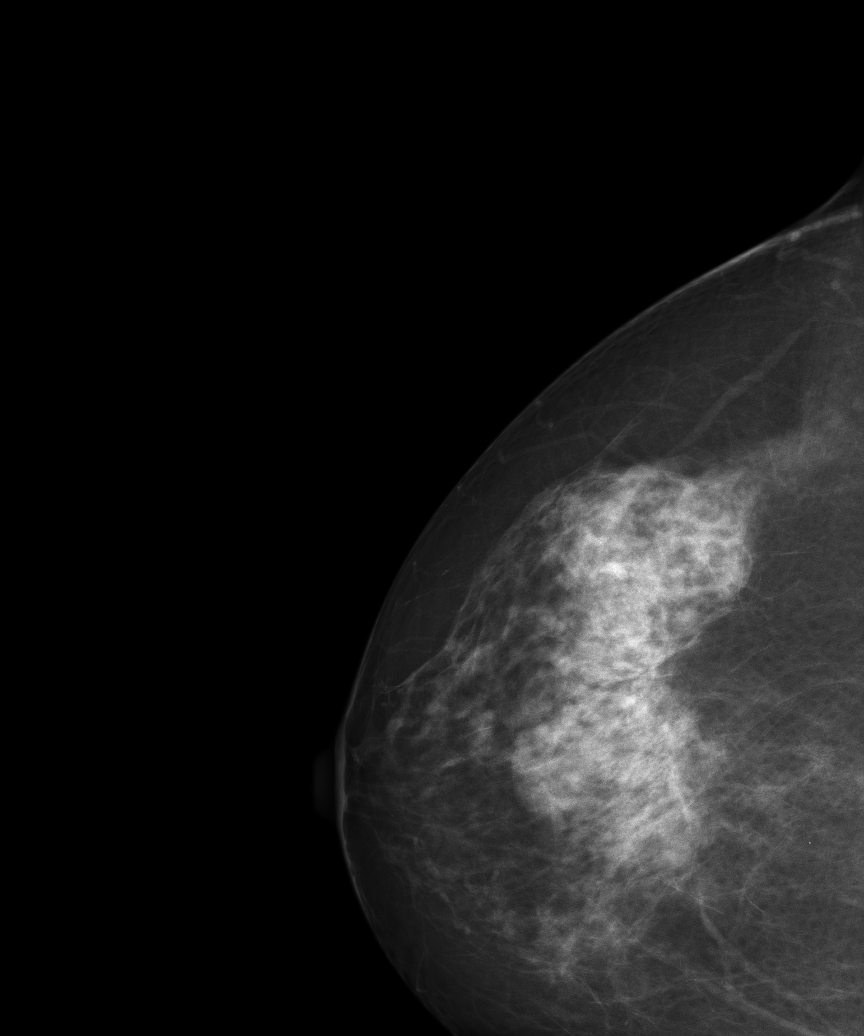
[im 3/6]
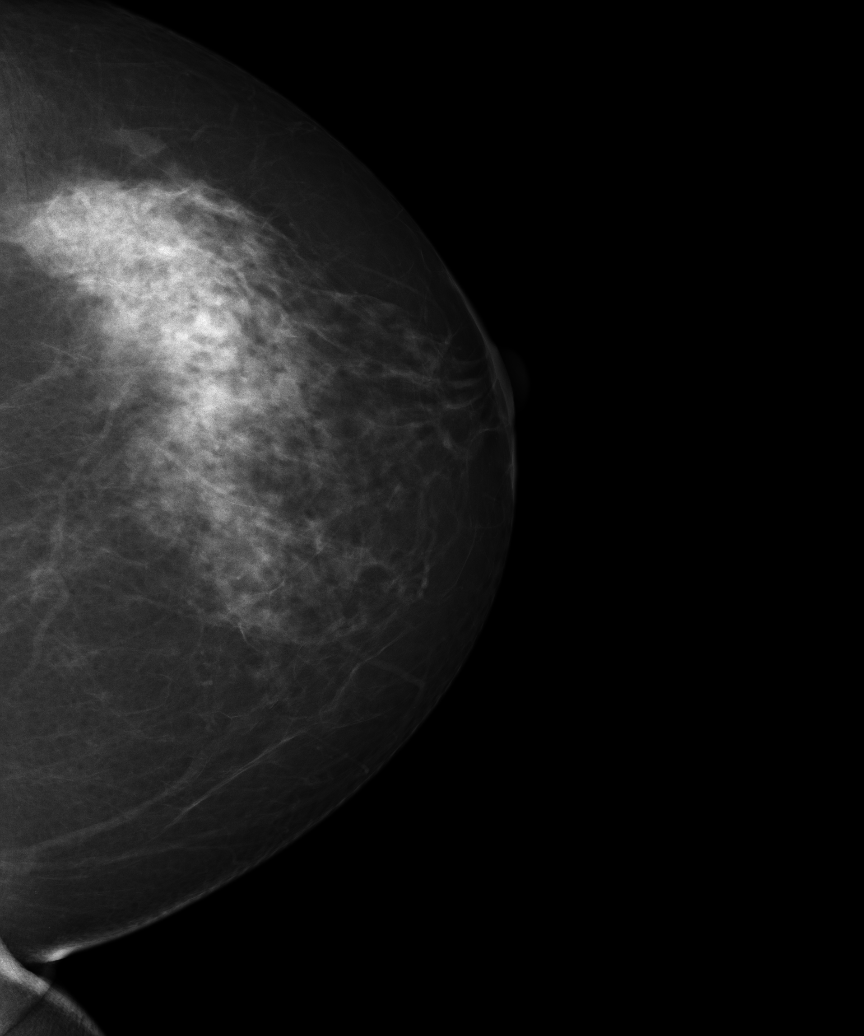
[im 4/6]
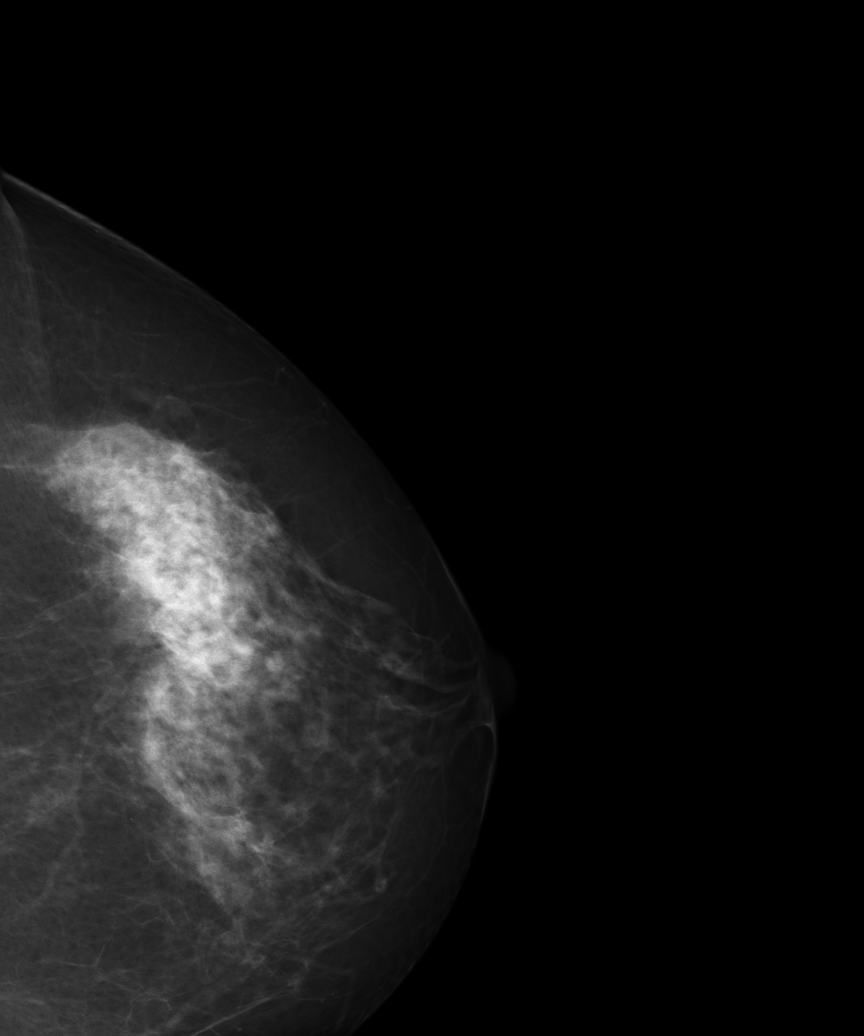
[im 5/6]
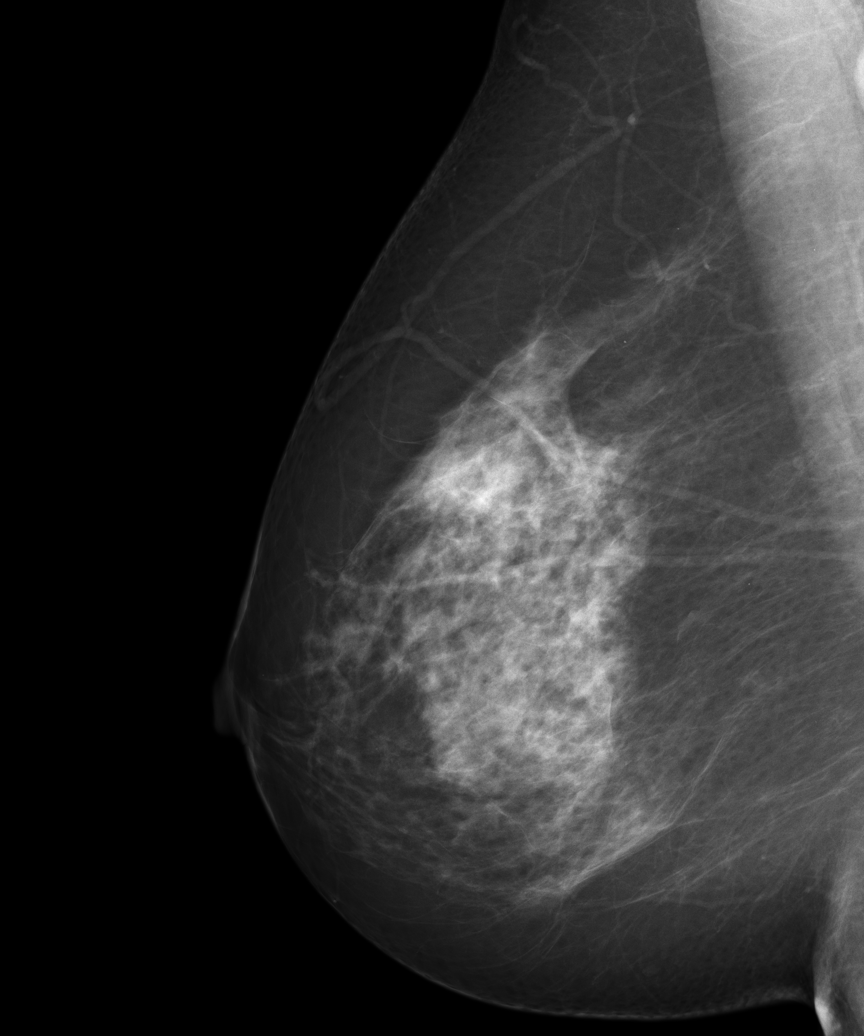
[im 6/6]
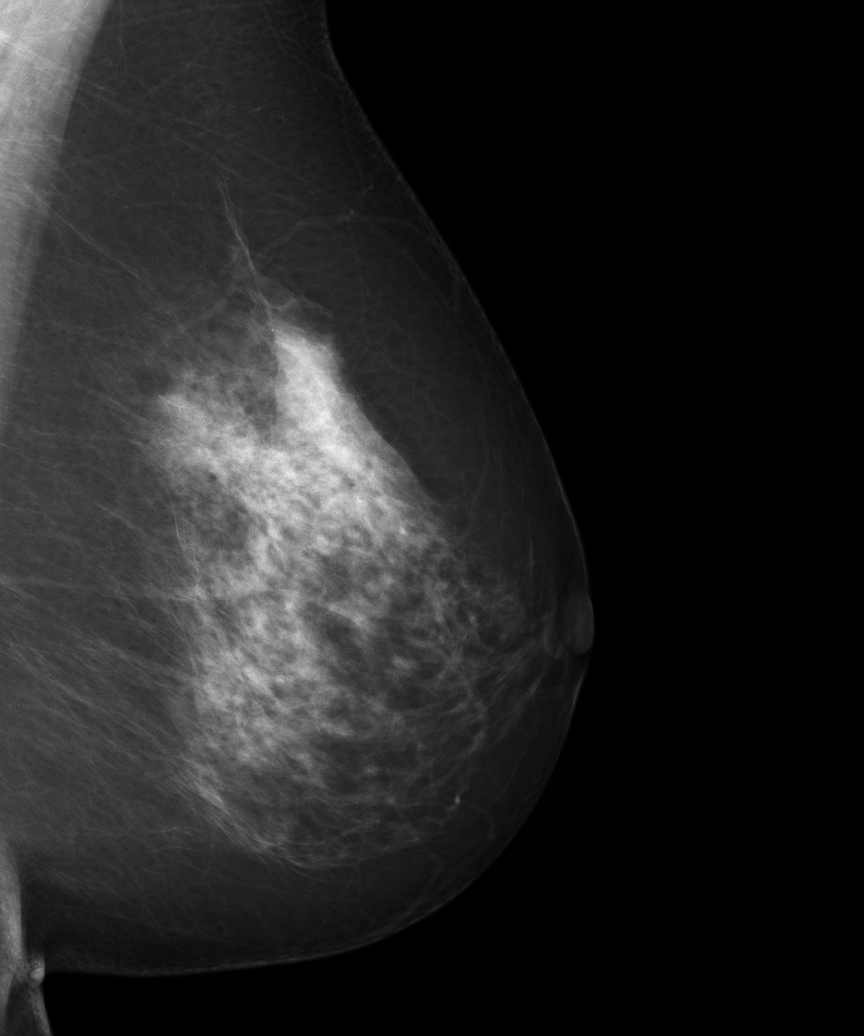

[6 of 6 positions shown; findings below may reference images not displayed]

IMPRESSION: 1.     Benign exam.
2.     Routine yearly follow up mammogram is suggested.
3.     BI-RADS:  Category 2- Benign Finding.

A negative mammogram report does not preclude biopsy or other evaluation of
a clinically palpable or otherwise suspicious mass or lesion.  Breast cancer
may not be detected by mammography in up to 10% of cases.

## 2014-01-28 DIAGNOSIS — G4730 Sleep apnea, unspecified: Secondary | ICD-10-CM | POA: Diagnosis not present

## 2014-01-28 DIAGNOSIS — G4710 Hypersomnia, unspecified: Secondary | ICD-10-CM | POA: Diagnosis not present

## 2014-02-03 DIAGNOSIS — G4730 Sleep apnea, unspecified: Secondary | ICD-10-CM | POA: Diagnosis not present

## 2014-02-03 DIAGNOSIS — G4710 Hypersomnia, unspecified: Secondary | ICD-10-CM | POA: Diagnosis not present

## 2014-02-03 DIAGNOSIS — G479 Sleep disorder, unspecified: Secondary | ICD-10-CM | POA: Diagnosis not present

## 2014-02-03 IMAGING — CR DG CHEST 2V
1 series · 2 of 2 positions shown · non-contrast
Comparison: none

REASON FOR EXAM: sob,htn,sleep apnea
COMMENTS:

PROCEDURE:     DXR - DXR CHEST PA (OR AP) AND LATERAL  - September 22, 2011  [DATE]
RESULT:     Comparison: 12/20/2007

[Series 1: w chest pa · 0.14mm/px · 2 of 2 slices shown]
[im 1/2]
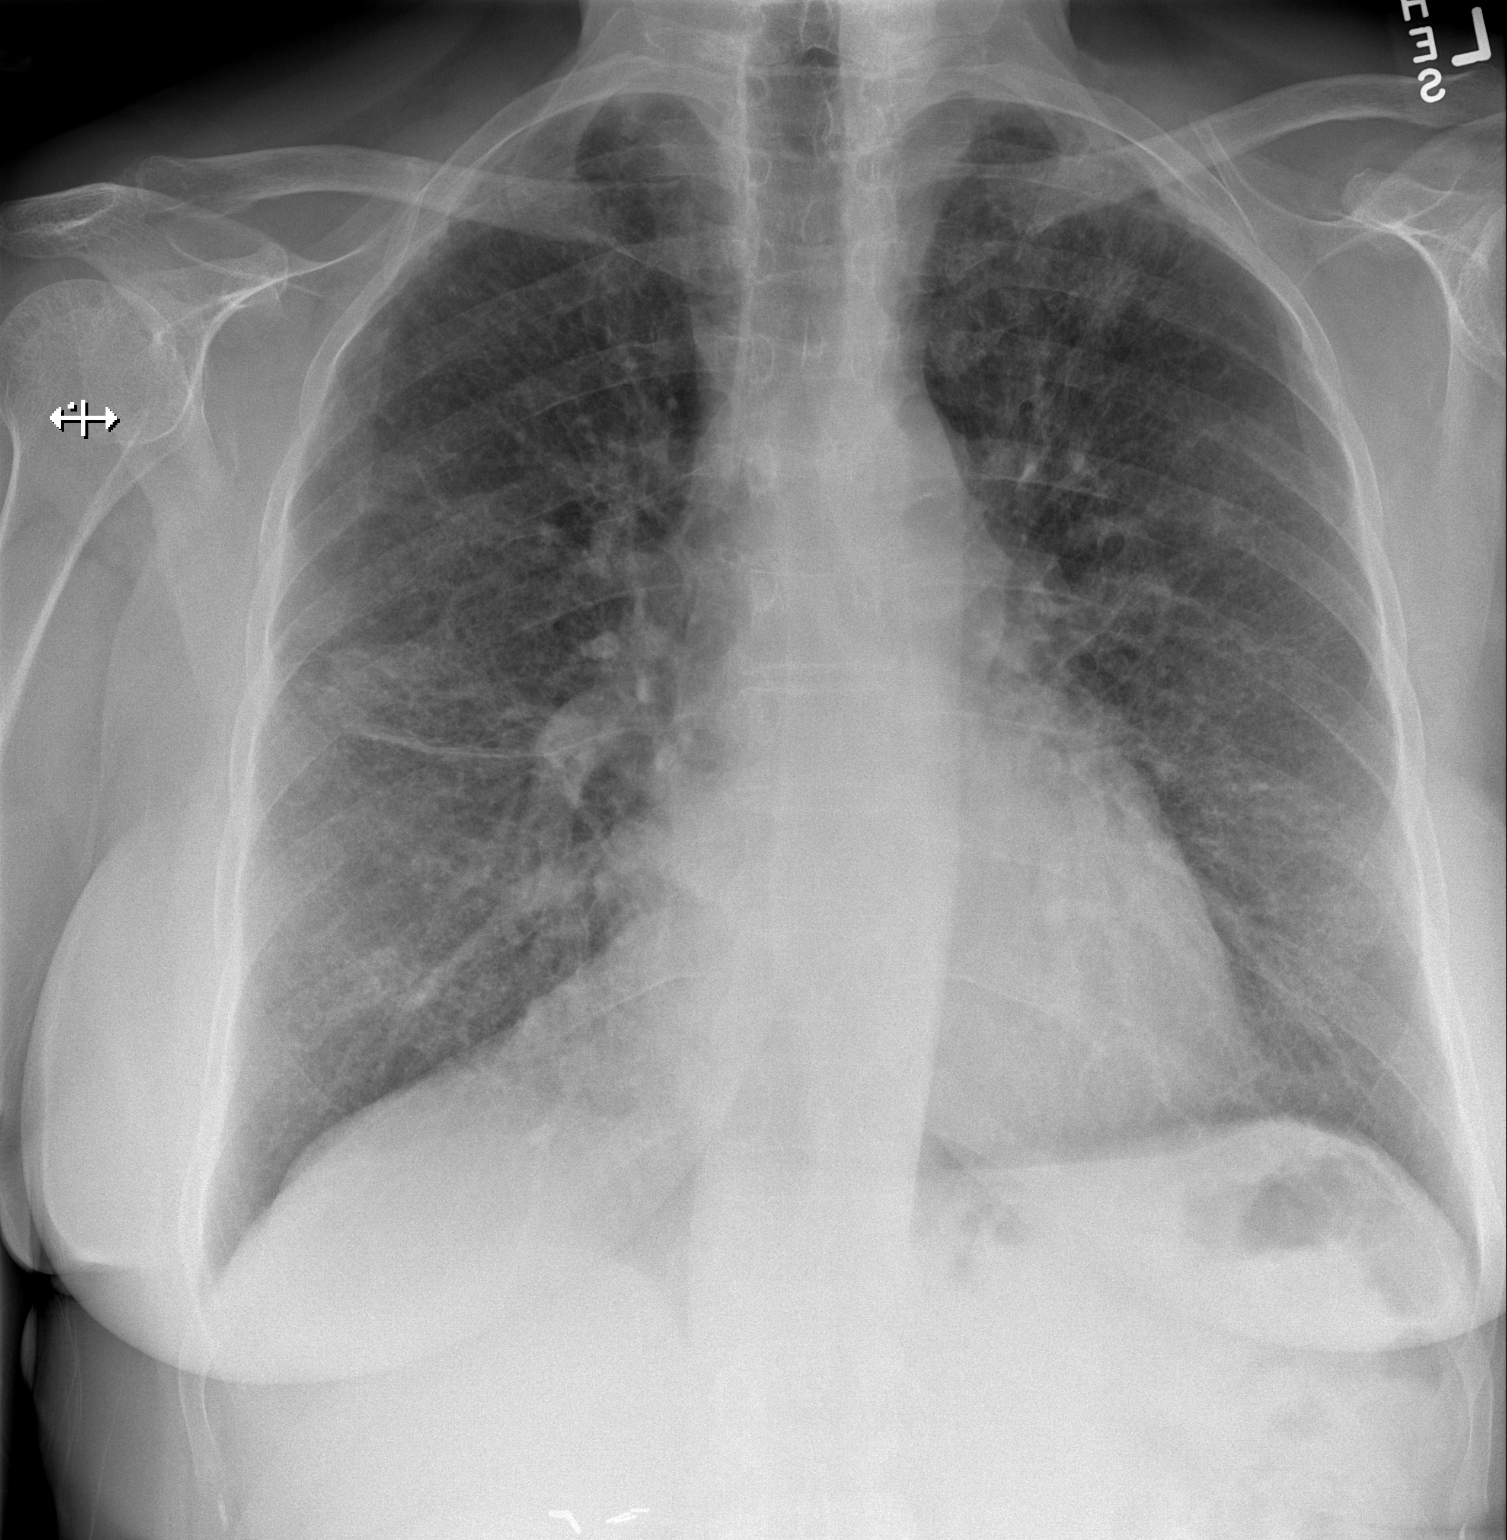
[im 2/2]
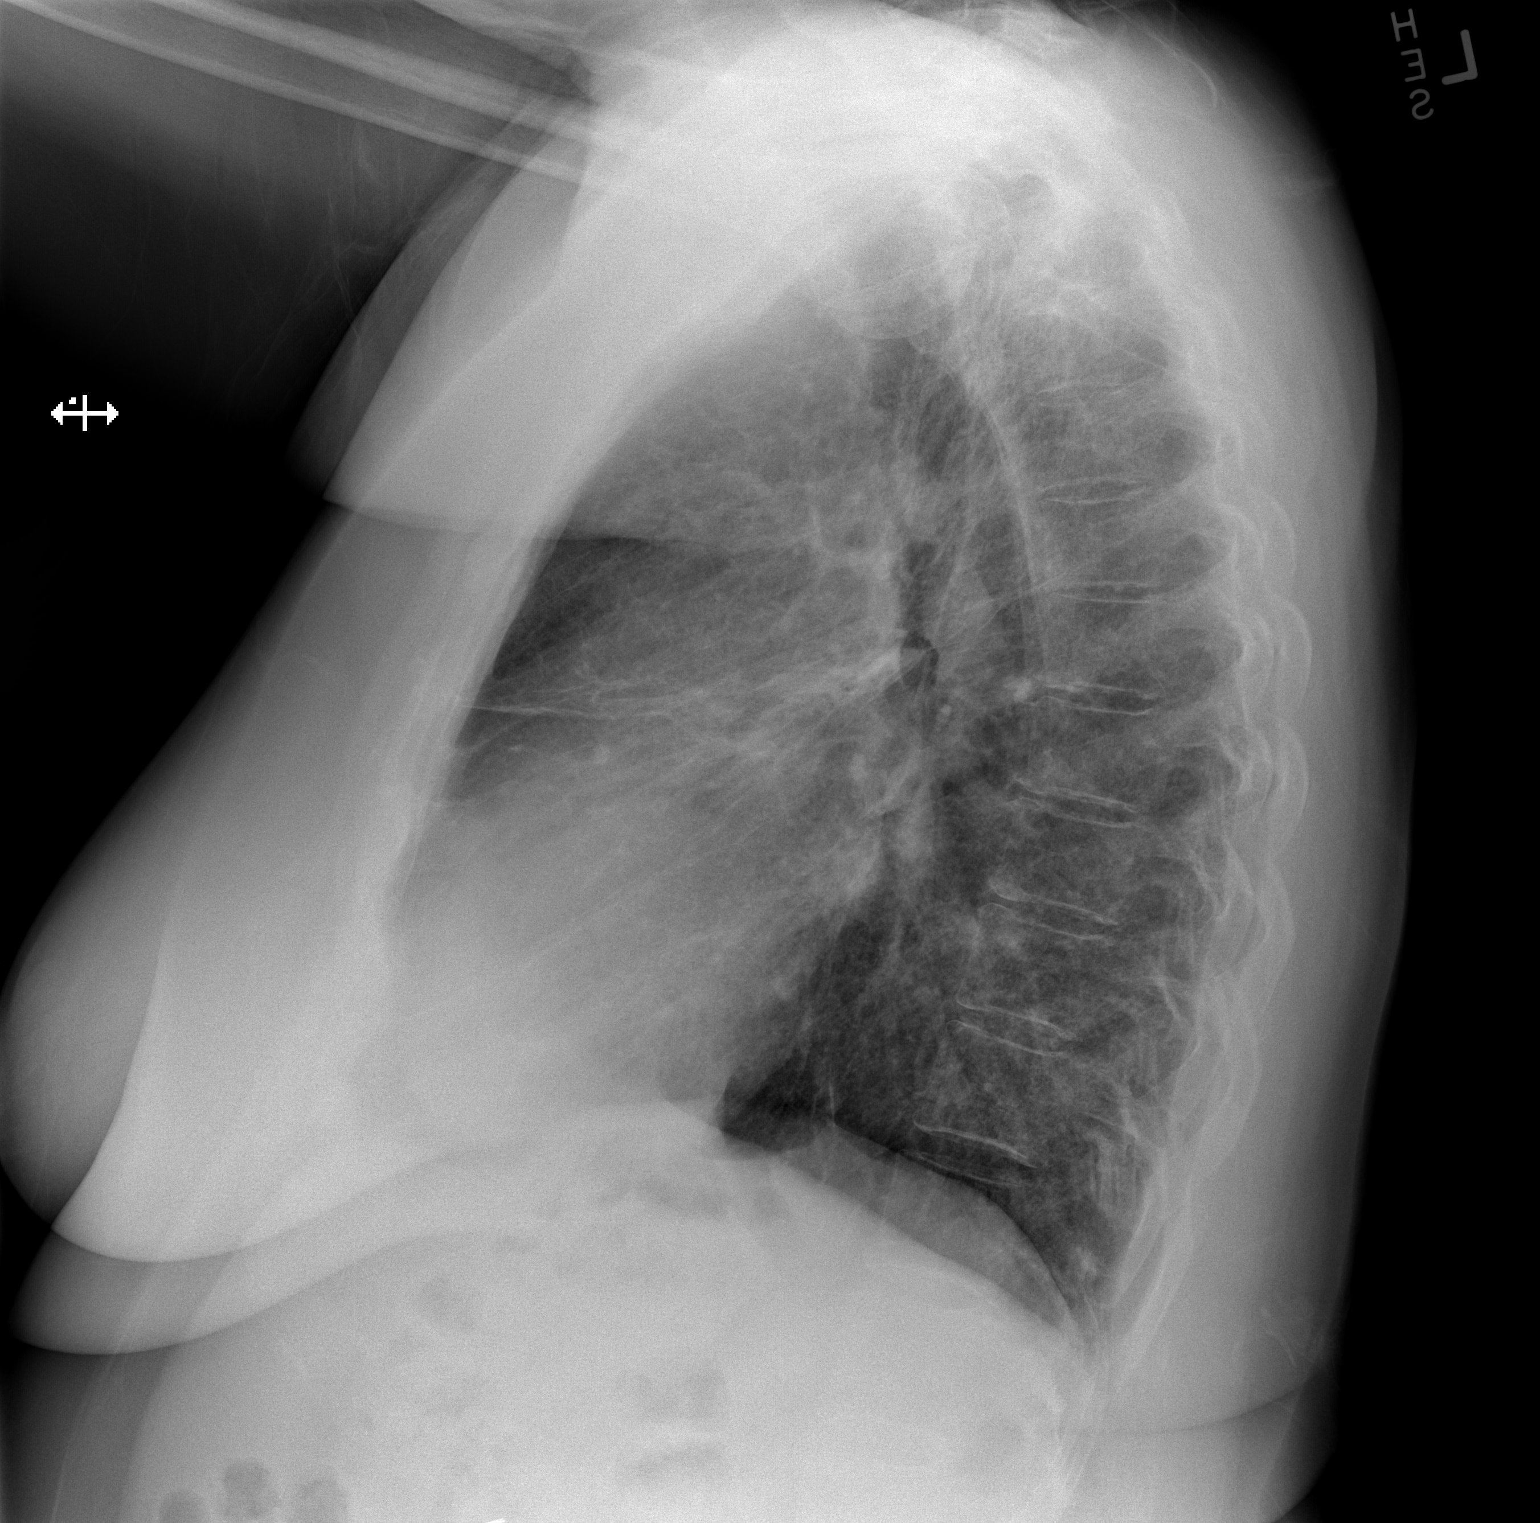

[2 of 2 positions shown; findings below may reference images not displayed]

FINDINGS: Heart size is upper limits normal, similar to prior. There are bilateral
interstitial pulmonary opacities. These are relatively similar to prior.
Linear opacities in the right mid lung are similar to prior and likely
secondary to atelectasis or scarring. Mild biapical pleuroparenchymal
thickening is similar to prior.
IMPRESSION: Diffuse interstitial opacities are similar to prior given differences in
technique. In the acute setting, these could represent interstitial
pulmonary edema. In the chronic setting findings could represent chronic
interstitial lung disease. Followup radiographs are suggested.

## 2014-02-07 IMAGING — CR DG CHEST 1V PORT
1 series · 1 of 1 positions shown · non-contrast
Comparison: none

REASON FOR EXAM: Pacemaker
COMMENTS:

[portable]
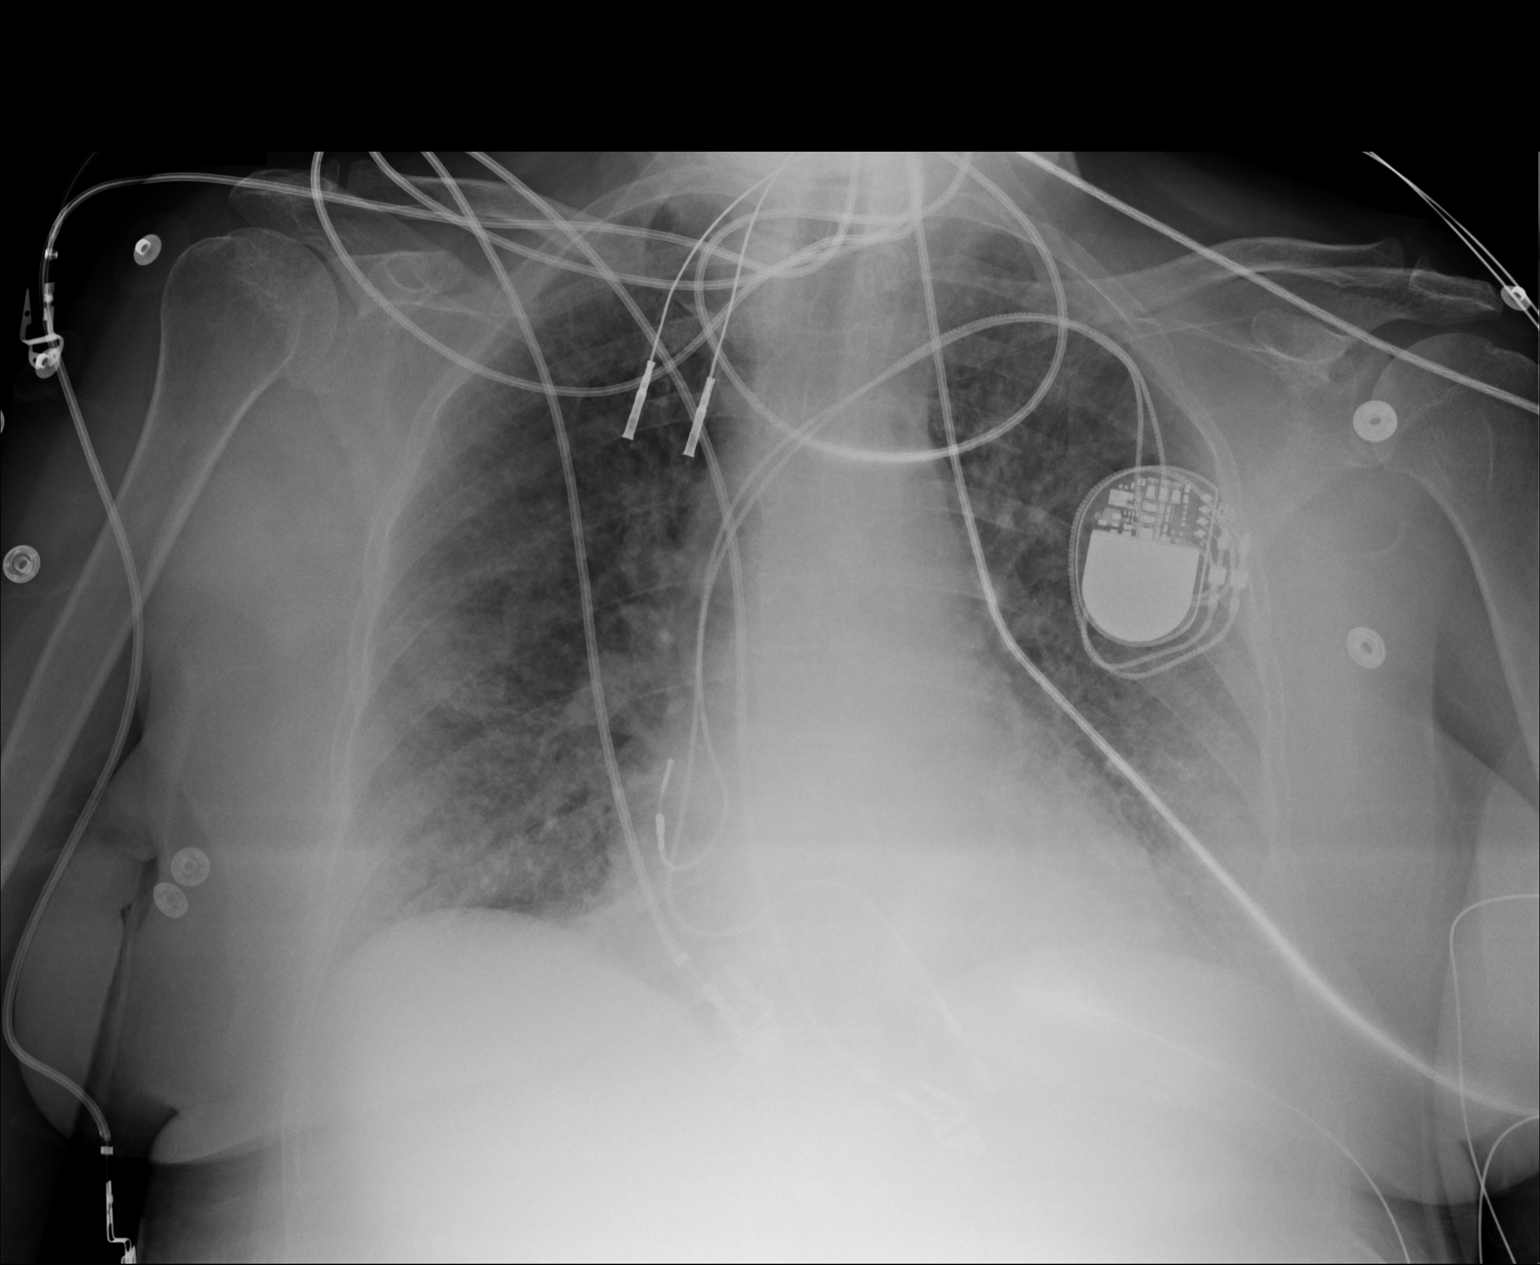

[1 of 1 positions shown; findings below may reference images not displayed]

PROCEDURE:     DXR - DXR PORTABLE CHEST SINGLE VIEW  - September 26, 2011  [DATE]

RESULT:     Portable AP view of the chest is compared to a prior exam of
09/22/2011. There is again noted nonspecific diffuse thickening of the
interstitial lung markings bilaterally. There has been little or no
appreciable interval change since the prior exam. The differential for such
findings is extensive but includes interstitial fibrosis and interstitial
edema. No acute pneumonia is seen. No pleural effusion is observed. Heart
size is within normal limits for a portable AP technique. A cardiac
pacemaker is present. No post procedure pneumothorax is seen.
IMPRESSION: 1. No pneumothorax is identified.
2. A cardiac pacemaker is now present.
3. There is chronic thickening of the interstitial lung markings as noted
above.
4. No new pulmonary infiltrates are seen.

## 2014-02-13 DIAGNOSIS — Z23 Encounter for immunization: Secondary | ICD-10-CM | POA: Diagnosis not present

## 2014-03-03 DIAGNOSIS — I4891 Unspecified atrial fibrillation: Secondary | ICD-10-CM | POA: Diagnosis not present

## 2014-03-06 DIAGNOSIS — I10 Essential (primary) hypertension: Secondary | ICD-10-CM | POA: Diagnosis not present

## 2014-03-06 DIAGNOSIS — I480 Paroxysmal atrial fibrillation: Secondary | ICD-10-CM | POA: Diagnosis not present

## 2014-03-06 DIAGNOSIS — E782 Mixed hyperlipidemia: Secondary | ICD-10-CM | POA: Diagnosis not present

## 2014-03-06 DIAGNOSIS — G4733 Obstructive sleep apnea (adult) (pediatric): Secondary | ICD-10-CM | POA: Insufficient documentation

## 2014-03-06 DIAGNOSIS — I495 Sick sinus syndrome: Secondary | ICD-10-CM | POA: Diagnosis not present

## 2014-03-12 DIAGNOSIS — G4710 Hypersomnia, unspecified: Secondary | ICD-10-CM | POA: Diagnosis not present

## 2014-03-12 DIAGNOSIS — F5101 Primary insomnia: Secondary | ICD-10-CM | POA: Diagnosis not present

## 2014-03-12 DIAGNOSIS — K219 Gastro-esophageal reflux disease without esophagitis: Secondary | ICD-10-CM | POA: Diagnosis not present

## 2014-03-12 DIAGNOSIS — I10 Essential (primary) hypertension: Secondary | ICD-10-CM | POA: Diagnosis not present

## 2014-03-12 DIAGNOSIS — G4733 Obstructive sleep apnea (adult) (pediatric): Secondary | ICD-10-CM | POA: Diagnosis not present

## 2014-03-12 DIAGNOSIS — E668 Other obesity: Secondary | ICD-10-CM | POA: Diagnosis not present

## 2014-03-16 ENCOUNTER — Encounter: Payer: Self-pay | Admitting: General Surgery

## 2014-05-15 DIAGNOSIS — I4891 Unspecified atrial fibrillation: Secondary | ICD-10-CM

## 2014-05-15 HISTORY — DX: Unspecified atrial fibrillation: I48.91

## 2014-07-13 DIAGNOSIS — I10 Essential (primary) hypertension: Secondary | ICD-10-CM | POA: Diagnosis not present

## 2014-07-13 DIAGNOSIS — F5101 Primary insomnia: Secondary | ICD-10-CM | POA: Diagnosis not present

## 2014-07-13 DIAGNOSIS — K219 Gastro-esophageal reflux disease without esophagitis: Secondary | ICD-10-CM | POA: Diagnosis not present

## 2014-07-13 DIAGNOSIS — N959 Unspecified menopausal and perimenopausal disorder: Secondary | ICD-10-CM | POA: Diagnosis not present

## 2014-08-03 DIAGNOSIS — Z23 Encounter for immunization: Secondary | ICD-10-CM | POA: Diagnosis not present

## 2014-08-03 DIAGNOSIS — G4733 Obstructive sleep apnea (adult) (pediatric): Secondary | ICD-10-CM | POA: Diagnosis not present

## 2014-08-20 DIAGNOSIS — I480 Paroxysmal atrial fibrillation: Secondary | ICD-10-CM | POA: Diagnosis not present

## 2014-08-20 DIAGNOSIS — I495 Sick sinus syndrome: Secondary | ICD-10-CM | POA: Diagnosis not present

## 2014-08-20 DIAGNOSIS — I10 Essential (primary) hypertension: Secondary | ICD-10-CM | POA: Diagnosis not present

## 2014-08-20 DIAGNOSIS — E782 Mixed hyperlipidemia: Secondary | ICD-10-CM | POA: Diagnosis not present

## 2014-08-24 DIAGNOSIS — R922 Inconclusive mammogram: Secondary | ICD-10-CM | POA: Diagnosis not present

## 2014-08-24 DIAGNOSIS — Z1231 Encounter for screening mammogram for malignant neoplasm of breast: Secondary | ICD-10-CM | POA: Diagnosis not present

## 2014-08-25 ENCOUNTER — Encounter: Payer: Self-pay | Admitting: General Surgery

## 2014-08-27 ENCOUNTER — Encounter: Payer: Self-pay | Admitting: General Surgery

## 2014-08-27 ENCOUNTER — Ambulatory Visit (INDEPENDENT_AMBULATORY_CARE_PROVIDER_SITE_OTHER): Payer: Medicare Other | Admitting: General Surgery

## 2014-08-27 VITALS — BP 130/70 | HR 72 | Resp 14 | Ht 66.0 in | Wt 216.0 lb

## 2014-08-27 DIAGNOSIS — N6019 Diffuse cystic mastopathy of unspecified breast: Secondary | ICD-10-CM | POA: Diagnosis not present

## 2014-08-27 DIAGNOSIS — Z800 Family history of malignant neoplasm of digestive organs: Secondary | ICD-10-CM

## 2014-08-27 NOTE — Patient Instructions (Signed)
The patient has been asked to return to the office in one year with a bilateral screening mammogram. 

## 2014-08-27 NOTE — Progress Notes (Signed)
Patient ID: Cathy Curtis, female   DOB: 1941-03-25, 74 y.o.   MRN: [id number]  Chief Complaint  Patient presents with  . Follow-up    mammogram    HPI Cathy Curtis is a 74 y.o. female who presents for a breast evaluation. The most recent mammogram was done on 08/24/14.  Patient does perform regular self breast checks and gets regular mammograms done.    HPI  Past Medical History  Diagnosis Date  . Unspecified essential hypertension 2002  . Personal history of tobacco use, presenting hazards to health   . Diffuse cystic mastopathy     FCD  . Osteoporosis   . Sleep apnea     admits to c-pap  . Family history of malignant neoplasm of gastrointestinal tract   . Breast screening, unspecified   . Special screening for malignant neoplasms, colon   . Obesity, unspecified   . Screening for obesity   . Atrial flutter 2009  . Hemorrhoids 2009    resolved  . Osteoarthritis     Past Surgical History  Procedure Laterality Date  . Cardioversion  2009, 2010  . Hemorrhoid surgery  09-06-2007    stapled  . Salpingoophorectomy  1974  . Abdominal hysterectomy    . Colonoscopy  2010,02/2014    Dr. Jamal Collin, [facility]  . Pacemaker insertion Left 09/2011  . Cholecystectomy      Family History  Problem Relation Age of Onset  . Colon cancer Father   . Colon cancer Sister   . Colon cancer Sister     Social History History  Substance Use Topics  . Smoking status: Former Smoker -- 1.00 packs/day for 20 years    Types: Cigarettes    Quit date: 05/16/1991  . Smokeless tobacco: Never Used  . Alcohol Use: Yes     Comment: occasionally    Allergies  Allergen Reactions  . Prevacid [Lansoprazole] Other (See Comments)    whelps    Current Outpatient Prescriptions  Medication Sig Dispense Refill  . amLODipine (NORVASC) 5 MG tablet Take 5 mg by mouth daily.    Marland Kitchen aspirin 325 MG tablet Take 325 mg by mouth daily.    Marland Kitchen CALCIUM-VITAMIN D PO Take by mouth.    . Cholecalciferol (VITAMIN D-3)  1000 UNITS CAPS Take by mouth daily.    . meloxicam (MOBIC) 7.5 MG tablet Take 7.5 mg by mouth 2 (two) times daily.    . metoprolol tartrate (LOPRESSOR) 25 MG tablet Take 25 mg by mouth 2 (two) times daily.    . pantoprazole (PROTONIX) 40 MG tablet Take 40 mg by mouth daily.     No current facility-administered medications for this visit.    Review of Systems Review of Systems  Constitutional: Negative.   Respiratory: Negative.   Cardiovascular: Negative.     Blood pressure 130/70, pulse 72, resp. rate 14, height 5\' 6"  (1.676 m), weight 216 lb (97.977 kg).  Physical Exam Physical Exam  Constitutional: She is oriented to person, place, and time. She appears well-nourished.  Eyes: Conjunctivae are normal. No scleral icterus.  Neck: Neck supple.  Cardiovascular: Normal rate, regular rhythm and normal heart sounds.   Pulmonary/Chest: Effort normal and breath sounds normal. Right breast exhibits no inverted nipple, no mass, no nipple discharge, no skin change and no tenderness. Left breast exhibits no inverted nipple, no mass, no nipple discharge, no skin change and no tenderness.  Abdominal: Soft. Bowel sounds are normal. There is no tenderness.  Lymphadenopathy:  She has no cervical adenopathy.    She has no axillary adenopathy.  Neurological: She is alert and oriented to person, place, and time.  Skin: Skin is warm and dry.    Data Reviewed Mammogram reviewed  Assessment       Stable exam. History of FCD. FH of colon cancer   Plan     Patient will be asked to return to the office in one year with a bilateral screening mammogram.  PCP:  Leroy Sea G 08/28/2014, 7:35 AM

## 2014-08-28 ENCOUNTER — Encounter: Payer: Self-pay | Admitting: General Surgery

## 2014-09-06 NOTE — Op Note (Signed)
PATIENT NAME:  Cathy Curtis, Cathy Curtis MR#:  [id number] DATE OF BIRTH:  [birth year]  DATE OF PROCEDURE:  09/26/2011  REFERRING PHYSICIAN: Dr. Clayborn Bigness.   INDICATION: The patient is a 74 year old female with history of paroxysmal atrial fibrillation. The patient has coexistent sleep apnea on CPAP machine. She has been experiencing increase in exertional fatigue and weakness with apparent symptomatic sinus bradycardia and chronotropic incompetence. Holter monitor revealed a mean heart rate of 51 bpm. Procedure, risks, benefits, and alternatives of permanent pacemaker implantation were explained to the patient and informed written consent was obtained.   DESCRIPTION OF PROCEDURE: She was brought to the Operating Room in a fasting state. The left pectoral region was prepped and draped in the usual sterile manner. Anesthesia was obtained with 1% Xylocaine locally. A 6 cm incision was performed over the left pectoral region. The pacemaker pocket was generated by electrocautery and blunt dissection. Access was obtained in the left subclavian vein by fine needle aspiration. Medtronic ventricular and atrial leads were positioned in the right ventricular apex and right atrial appendage under fluoroscopic guidance. After proper thresholds were obtained, the leads were sutured in place. The pacemaker pocket was irrigated with gentamicin solution. The leads were connected to a dual chamber rate responsive pacemaker generator and was positioned into the pocket. The pocket was closed with 2-0 and 4-0 Vicryl, respectively. Steri-Strips and pressure dressing were applied.   ____________________________ Isaias Cowman, MD ap:ap D: 09/26/2011 13:22:39 ET T: 09/26/2011 13:47:19 ET JOB#: [id number]  cc: Isaias Cowman, MD, <Dictator> Isaias Cowman MD ELECTRONICALLY SIGNED 10/26/2011 17:39

## 2014-11-06 DIAGNOSIS — Z0001 Encounter for general adult medical examination with abnormal findings: Secondary | ICD-10-CM | POA: Diagnosis not present

## 2014-11-06 DIAGNOSIS — E559 Vitamin D deficiency, unspecified: Secondary | ICD-10-CM | POA: Diagnosis not present

## 2014-11-06 DIAGNOSIS — I10 Essential (primary) hypertension: Secondary | ICD-10-CM | POA: Diagnosis not present

## 2014-11-06 DIAGNOSIS — E782 Mixed hyperlipidemia: Secondary | ICD-10-CM | POA: Diagnosis not present

## 2014-11-12 DIAGNOSIS — I10 Essential (primary) hypertension: Secondary | ICD-10-CM | POA: Diagnosis not present

## 2014-11-12 DIAGNOSIS — K219 Gastro-esophageal reflux disease without esophagitis: Secondary | ICD-10-CM | POA: Diagnosis not present

## 2014-11-12 DIAGNOSIS — G4733 Obstructive sleep apnea (adult) (pediatric): Secondary | ICD-10-CM | POA: Diagnosis not present

## 2014-11-12 DIAGNOSIS — E782 Mixed hyperlipidemia: Secondary | ICD-10-CM | POA: Diagnosis not present

## 2014-11-12 DIAGNOSIS — R944 Abnormal results of kidney function studies: Secondary | ICD-10-CM | POA: Diagnosis not present

## 2014-11-12 DIAGNOSIS — N3941 Urge incontinence: Secondary | ICD-10-CM | POA: Diagnosis not present

## 2014-11-12 DIAGNOSIS — Z0001 Encounter for general adult medical examination with abnormal findings: Secondary | ICD-10-CM | POA: Diagnosis not present

## 2014-11-26 DIAGNOSIS — N3941 Urge incontinence: Secondary | ICD-10-CM | POA: Diagnosis not present

## 2014-12-02 DIAGNOSIS — Z780 Asymptomatic menopausal state: Secondary | ICD-10-CM | POA: Diagnosis not present

## 2014-12-02 DIAGNOSIS — M8580 Other specified disorders of bone density and structure, unspecified site: Secondary | ICD-10-CM | POA: Diagnosis not present

## 2014-12-02 DIAGNOSIS — Z13820 Encounter for screening for osteoporosis: Secondary | ICD-10-CM | POA: Diagnosis not present

## 2014-12-02 DIAGNOSIS — M8588 Other specified disorders of bone density and structure, other site: Secondary | ICD-10-CM | POA: Diagnosis not present

## 2014-12-08 DIAGNOSIS — R944 Abnormal results of kidney function studies: Secondary | ICD-10-CM | POA: Diagnosis not present

## 2014-12-08 IMAGING — MG MAM DGTL SCRN MAM NO ORDER W/CAD
1 series · 5 of 5 positions shown · non-contrast
Comparison: none

REASON FOR EXAM: SCR MAMMO NO ORDER
COMMENTS:

[R CC · right · 5 of 5 slices shown]
[im 1/5]
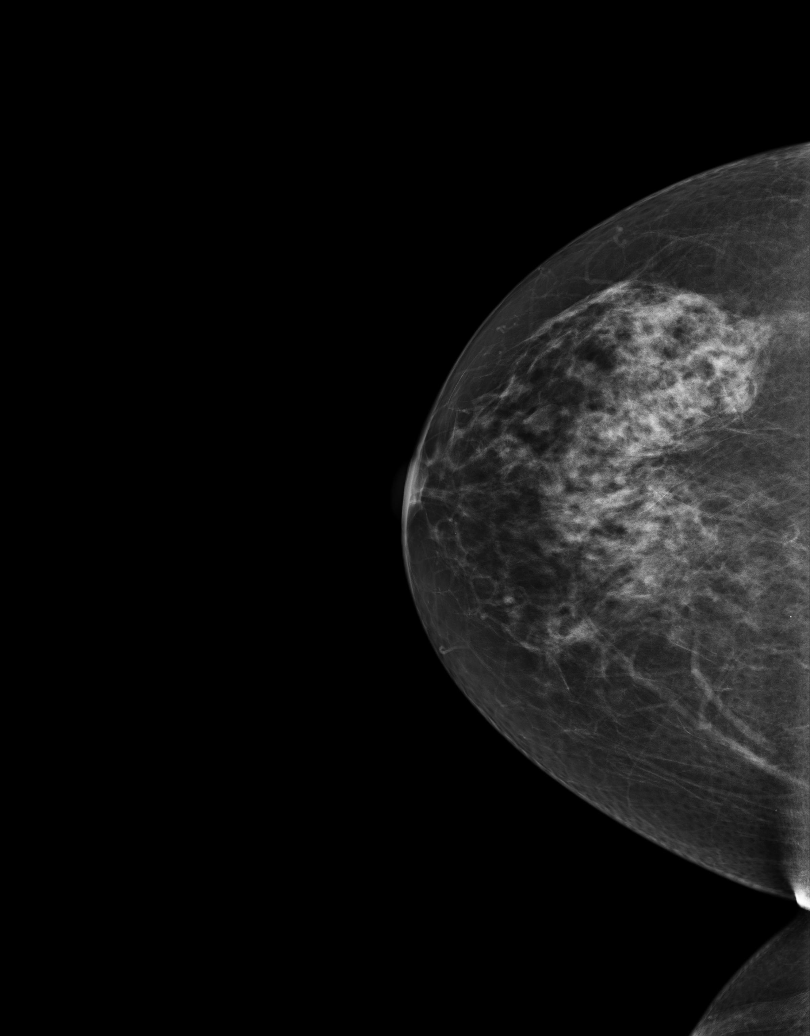
[im 2/5]
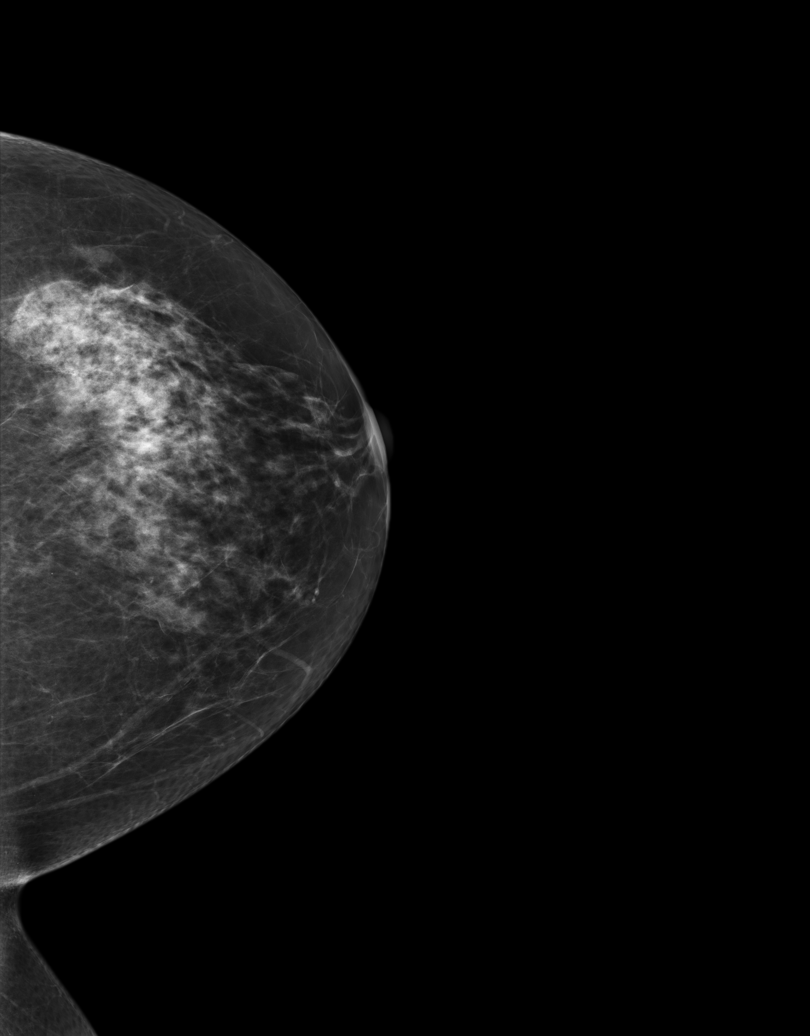
[im 3/5]
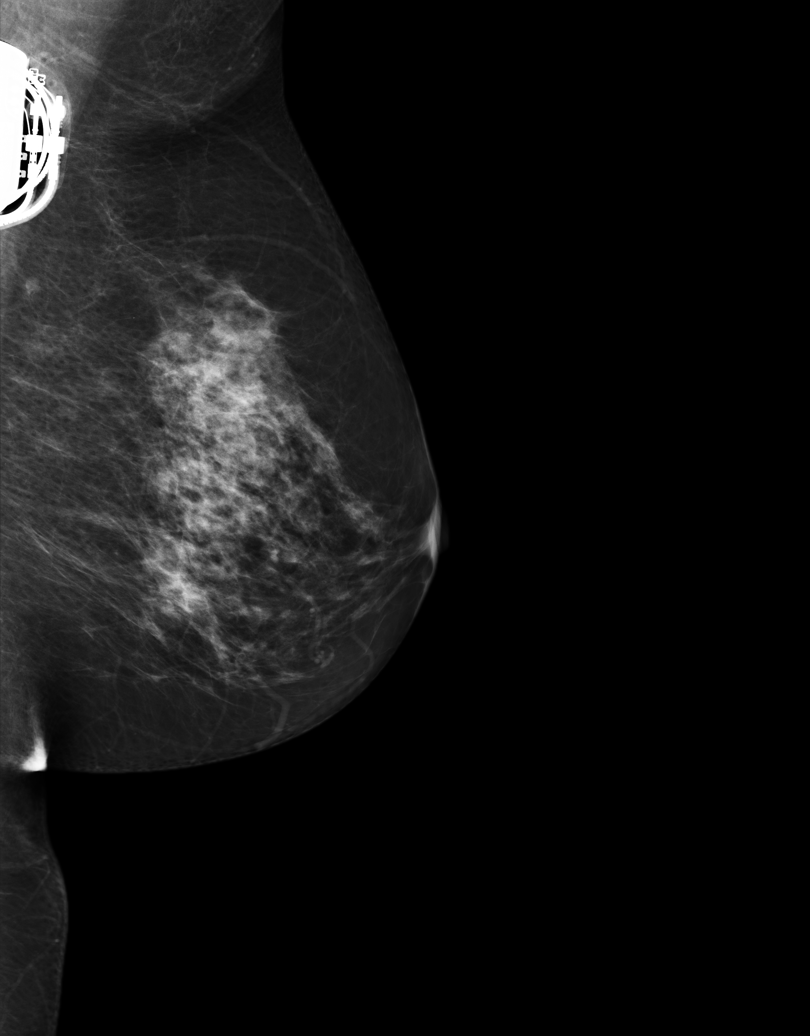
[im 4/5]
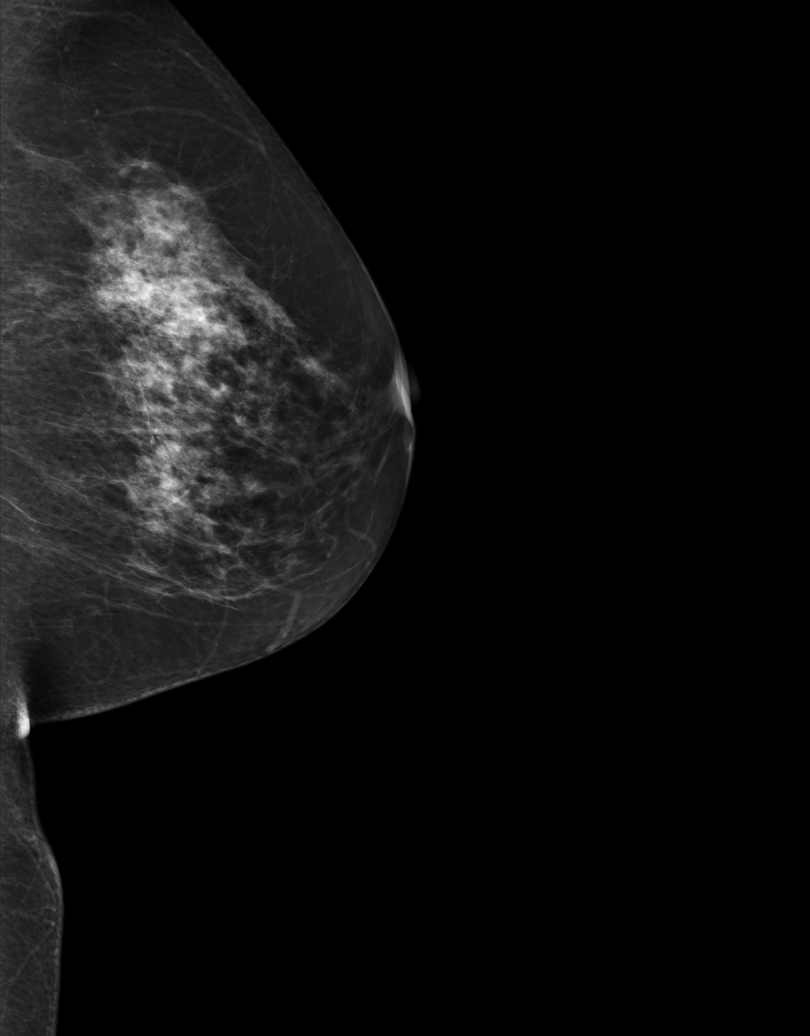
[im 5/5]
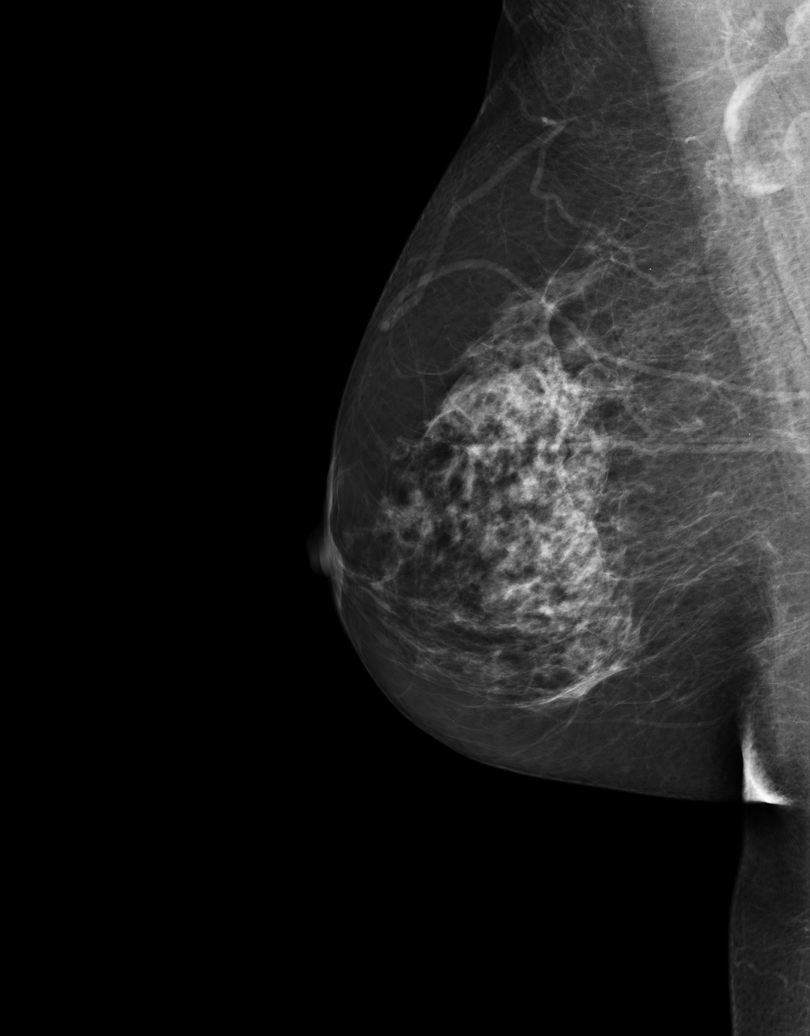

[5 of 5 positions shown; findings below may reference images not displayed]

PROCEDURE:     MAM - MAM DGTL SCRN MAM NO ORDER W/CAD  - July 26, 2012 [DATE]

RESULT:     Comparison is made to previous digital studies July 26, 2011,July 25, 2010, and July 22, 2007.

The breasts exhibit a scattered fibroglandular pattern. A pacemaker
generator overlies the left axillary region. A benign-appearing lymph node
is present in the right axillary region. There is no dominant mass. There
are no malignant appearing groupings of microcalcification. No area of new
architectural distortion is demonstrated.
IMPRESSION: There are no findings suspicious for malignancy.

BI-RADS 2: Benign findings.

Recommendation: please continue to encourage yearly mammographic followup.

BREAST COMPOSITION: The breast composition is SCATTERED FIBROGLANDULAR
TISSUE (glandular tissue is 25-50%)

A NEGATIVE MAMMOGRAM REPORT DOES NOT PRECLUDE BIOPSY OR OTHER EVALUATION OF
A CLINICALLY PALPABLE OR OTHERWISE SUSPICIOUS MASS OR LESION. BREAST CANCER
MAY NOT BE DETECTED BY MAMMOGRAPHY IN UP TO 10% OF CASES.

[REDACTED]

## 2014-12-11 DIAGNOSIS — R040 Epistaxis: Secondary | ICD-10-CM | POA: Diagnosis not present

## 2014-12-15 DIAGNOSIS — R040 Epistaxis: Secondary | ICD-10-CM | POA: Diagnosis not present

## 2014-12-15 DIAGNOSIS — N3941 Urge incontinence: Secondary | ICD-10-CM | POA: Diagnosis not present

## 2014-12-15 DIAGNOSIS — I10 Essential (primary) hypertension: Secondary | ICD-10-CM | POA: Diagnosis not present

## 2014-12-15 DIAGNOSIS — G4733 Obstructive sleep apnea (adult) (pediatric): Secondary | ICD-10-CM | POA: Diagnosis not present

## 2014-12-15 DIAGNOSIS — R944 Abnormal results of kidney function studies: Secondary | ICD-10-CM | POA: Diagnosis not present

## 2015-01-27 DIAGNOSIS — G4733 Obstructive sleep apnea (adult) (pediatric): Secondary | ICD-10-CM | POA: Diagnosis not present

## 2015-02-01 DIAGNOSIS — K219 Gastro-esophageal reflux disease without esophagitis: Secondary | ICD-10-CM | POA: Diagnosis not present

## 2015-02-01 DIAGNOSIS — G4733 Obstructive sleep apnea (adult) (pediatric): Secondary | ICD-10-CM | POA: Diagnosis not present

## 2015-02-01 DIAGNOSIS — Z23 Encounter for immunization: Secondary | ICD-10-CM | POA: Diagnosis not present

## 2015-02-18 DIAGNOSIS — Z23 Encounter for immunization: Secondary | ICD-10-CM | POA: Diagnosis not present

## 2015-03-01 DIAGNOSIS — Z950 Presence of cardiac pacemaker: Secondary | ICD-10-CM | POA: Insufficient documentation

## 2015-03-01 DIAGNOSIS — I10 Essential (primary) hypertension: Secondary | ICD-10-CM | POA: Insufficient documentation

## 2015-03-09 DIAGNOSIS — R05 Cough: Secondary | ICD-10-CM | POA: Diagnosis not present

## 2015-03-09 DIAGNOSIS — I495 Sick sinus syndrome: Secondary | ICD-10-CM | POA: Diagnosis not present

## 2015-03-09 DIAGNOSIS — E782 Mixed hyperlipidemia: Secondary | ICD-10-CM | POA: Diagnosis not present

## 2015-03-09 DIAGNOSIS — I10 Essential (primary) hypertension: Secondary | ICD-10-CM | POA: Diagnosis not present

## 2015-03-09 DIAGNOSIS — I4891 Unspecified atrial fibrillation: Secondary | ICD-10-CM | POA: Diagnosis not present

## 2015-04-12 DIAGNOSIS — R944 Abnormal results of kidney function studies: Secondary | ICD-10-CM | POA: Diagnosis not present

## 2015-04-19 DIAGNOSIS — R944 Abnormal results of kidney function studies: Secondary | ICD-10-CM | POA: Diagnosis not present

## 2015-04-19 DIAGNOSIS — I10 Essential (primary) hypertension: Secondary | ICD-10-CM | POA: Diagnosis not present

## 2015-04-19 DIAGNOSIS — K219 Gastro-esophageal reflux disease without esophagitis: Secondary | ICD-10-CM | POA: Diagnosis not present

## 2015-04-19 DIAGNOSIS — M25569 Pain in unspecified knee: Secondary | ICD-10-CM | POA: Diagnosis not present

## 2015-05-17 DIAGNOSIS — H25813 Combined forms of age-related cataract, bilateral: Secondary | ICD-10-CM | POA: Diagnosis not present

## 2015-06-16 ENCOUNTER — Encounter: Payer: Self-pay | Admitting: *Deleted

## 2015-08-04 DIAGNOSIS — G4733 Obstructive sleep apnea (adult) (pediatric): Secondary | ICD-10-CM | POA: Diagnosis not present

## 2015-08-09 DIAGNOSIS — M170 Bilateral primary osteoarthritis of knee: Secondary | ICD-10-CM | POA: Diagnosis not present

## 2015-08-09 DIAGNOSIS — Z23 Encounter for immunization: Secondary | ICD-10-CM | POA: Diagnosis not present

## 2015-08-09 DIAGNOSIS — G4733 Obstructive sleep apnea (adult) (pediatric): Secondary | ICD-10-CM | POA: Diagnosis not present

## 2015-08-09 DIAGNOSIS — I10 Essential (primary) hypertension: Secondary | ICD-10-CM | POA: Diagnosis not present

## 2015-08-17 DIAGNOSIS — N3941 Urge incontinence: Secondary | ICD-10-CM | POA: Diagnosis not present

## 2015-08-17 DIAGNOSIS — K219 Gastro-esophageal reflux disease without esophagitis: Secondary | ICD-10-CM | POA: Diagnosis not present

## 2015-08-17 DIAGNOSIS — M25562 Pain in left knee: Secondary | ICD-10-CM | POA: Diagnosis not present

## 2015-08-17 DIAGNOSIS — I10 Essential (primary) hypertension: Secondary | ICD-10-CM | POA: Diagnosis not present

## 2015-08-26 DIAGNOSIS — Z1231 Encounter for screening mammogram for malignant neoplasm of breast: Secondary | ICD-10-CM | POA: Diagnosis not present

## 2015-08-30 ENCOUNTER — Encounter: Payer: Self-pay | Admitting: General Surgery

## 2015-08-31 DIAGNOSIS — I10 Essential (primary) hypertension: Secondary | ICD-10-CM | POA: Diagnosis not present

## 2015-08-31 DIAGNOSIS — I495 Sick sinus syndrome: Secondary | ICD-10-CM | POA: Diagnosis not present

## 2015-08-31 DIAGNOSIS — I480 Paroxysmal atrial fibrillation: Secondary | ICD-10-CM | POA: Diagnosis not present

## 2015-08-31 DIAGNOSIS — G4733 Obstructive sleep apnea (adult) (pediatric): Secondary | ICD-10-CM | POA: Diagnosis not present

## 2015-09-02 ENCOUNTER — Ambulatory Visit (INDEPENDENT_AMBULATORY_CARE_PROVIDER_SITE_OTHER): Payer: Medicare Other | Admitting: General Surgery

## 2015-09-02 ENCOUNTER — Encounter: Payer: Self-pay | Admitting: General Surgery

## 2015-09-02 VITALS — BP 142/76 | Ht 66.0 in | Wt 210.0 lb

## 2015-09-02 DIAGNOSIS — N6019 Diffuse cystic mastopathy of unspecified breast: Secondary | ICD-10-CM | POA: Diagnosis not present

## 2015-09-02 DIAGNOSIS — I480 Paroxysmal atrial fibrillation: Secondary | ICD-10-CM | POA: Diagnosis not present

## 2015-09-02 DIAGNOSIS — Z800 Family history of malignant neoplasm of digestive organs: Secondary | ICD-10-CM

## 2015-09-02 NOTE — Progress Notes (Signed)
Patient ID: Cathy Curtis, female   DOB: 1941-03-04, 75 y.o.   MRN: [id number]  Chief Complaint  Patient presents with  . Follow-up    mammogram    HPI Cathy Curtis is a 75 y.o. female who presents for a breast evaluation. The most recent mammogram was done on 08/26/15.  Patient does perform regular self breast checks and gets regular mammograms done.  No new breast issues.  She has an echocardiogram scheduled for this afternoon. I have reviewed the history of present illness with the patient.  HPI  Past Medical History  Diagnosis Date  . Unspecified essential hypertension 2002  . Personal history of tobacco use, presenting hazards to health   . Diffuse cystic mastopathy     FCD  . Osteoporosis   . Sleep apnea     admits to c-pap  . Family history of malignant neoplasm of gastrointestinal tract   . Breast screening, unspecified   . Special screening for malignant neoplasms, colon   . Obesity, unspecified   . Screening for obesity   . Atrial flutter ([place]) 2009  . Hemorrhoids 2009    resolved  . Osteoarthritis   . Atrial fibrillation ([place]) 2016    Past Surgical History  Procedure Laterality Date  . Cardioversion  2009, 2010  . Hemorrhoid surgery  09-06-2007    stapled  . Salpingoophorectomy  1974  . Abdominal hysterectomy    . Colonoscopy  2010,02/2014    Dr. Jamal Collin, [facility]  . Pacemaker insertion Left 09/2011  . Cholecystectomy      Family History  Problem Relation Age of Onset  . Colon cancer Father   . Colon cancer Sister   . Colon cancer Sister     Social History Social History  Substance Use Topics  . Smoking status: Former Smoker -- 1.00 packs/day for 20 years    Types: Cigarettes    Quit date: 05/16/1991  . Smokeless tobacco: Never Used  . Alcohol Use: Yes     Comment: occasionally    Allergies  Allergen Reactions  . Prevacid [Lansoprazole] Other (See Comments)    whelps    Current Outpatient Prescriptions  Medication Sig Dispense Refill  .  amLODipine (NORVASC) 5 MG tablet Take 5 mg by mouth daily.    Marland Kitchen apixaban (ELIQUIS) 5 MG TABS tablet Take 5 mg by mouth 2 (two) times daily.    Marland Kitchen CALCIUM-VITAMIN D PO Take by mouth.    . celecoxib (CELEBREX) 200 MG capsule Take 200 mg by mouth daily.     . Cholecalciferol (VITAMIN D-3) 1000 UNITS CAPS Take by mouth daily.    . metoprolol tartrate (LOPRESSOR) 25 MG tablet Take 25 mg by mouth 2 (two) times daily.    Marland Kitchen omeprazole (PRILOSEC) 40 MG capsule     . oxybutynin (DITROPAN-XL) 5 MG 24 hr tablet Take 5 mg by mouth at bedtime.     Marland Kitchen zolpidem (AMBIEN CR) 12.5 MG CR tablet      No current facility-administered medications for this visit.    Review of Systems Review of Systems  Constitutional: Negative.   Respiratory: Negative.   Cardiovascular: Negative.     Blood pressure 142/76, height 5\' 6"  (1.676 m), weight 210 lb (95.255 kg).  Physical Exam Physical Exam  Constitutional: She is oriented to person, place, and time. She appears well-developed and well-nourished.  HENT:  Mouth/Throat: Oropharynx is clear and moist.  Eyes: Conjunctivae are normal. No scleral icterus.  Neck: Neck supple.  Cardiovascular: Normal  rate and normal heart sounds.  An irregularly irregular rhythm present.  Pulmonary/Chest: Effort normal and breath sounds normal. Right breast exhibits no inverted nipple, no mass, no nipple discharge, no skin change and no tenderness. Left breast exhibits no inverted nipple, no mass, no nipple discharge, no skin change and no tenderness.    Abdominal: Soft. Normal appearance. There is no hepatomegaly. There is no tenderness.  Lymphadenopathy:    She has no cervical adenopathy.    She has no axillary adenopathy.  Neurological: She is alert and oriented to person, place, and time.  Skin: Skin is warm and dry.  Psychiatric: Her behavior is normal.    Data Reviewed Mammogram reviewed.  Assessment    Stable exam. History of FCD. FH of colon cancer.      Plan     Patient will be asked to return to the office in one year with a bilateral screening mammogram.     PCP:  Lavera Guise  This information has been scribed by Karie Fetch RN, BSN,BC.   Andrian Urbach G 09/02/2015, 11:12 AM

## 2015-09-02 NOTE — Patient Instructions (Addendum)
Patient will be asked to return to the office in one year with a bilateral screening mammogram. The patient is aware to call back for any questions or concerns. 

## 2015-09-09 DIAGNOSIS — I10 Essential (primary) hypertension: Secondary | ICD-10-CM | POA: Diagnosis not present

## 2015-09-09 DIAGNOSIS — I480 Paroxysmal atrial fibrillation: Secondary | ICD-10-CM | POA: Diagnosis not present

## 2015-11-08 DIAGNOSIS — I10 Essential (primary) hypertension: Secondary | ICD-10-CM | POA: Diagnosis not present

## 2015-11-08 DIAGNOSIS — Z0001 Encounter for general adult medical examination with abnormal findings: Secondary | ICD-10-CM | POA: Diagnosis not present

## 2015-11-08 DIAGNOSIS — E782 Mixed hyperlipidemia: Secondary | ICD-10-CM | POA: Diagnosis not present

## 2015-11-08 DIAGNOSIS — E559 Vitamin D deficiency, unspecified: Secondary | ICD-10-CM | POA: Diagnosis not present

## 2015-11-17 DIAGNOSIS — I4891 Unspecified atrial fibrillation: Secondary | ICD-10-CM | POA: Diagnosis not present

## 2015-11-17 DIAGNOSIS — M25569 Pain in unspecified knee: Secondary | ICD-10-CM | POA: Diagnosis not present

## 2015-11-17 DIAGNOSIS — E782 Mixed hyperlipidemia: Secondary | ICD-10-CM | POA: Diagnosis not present

## 2015-11-17 DIAGNOSIS — Z0001 Encounter for general adult medical examination with abnormal findings: Secondary | ICD-10-CM | POA: Diagnosis not present

## 2015-11-17 DIAGNOSIS — G4733 Obstructive sleep apnea (adult) (pediatric): Secondary | ICD-10-CM | POA: Diagnosis not present

## 2015-11-17 DIAGNOSIS — I10 Essential (primary) hypertension: Secondary | ICD-10-CM | POA: Diagnosis not present

## 2015-11-26 IMAGING — CR DG KNEE COMPLETE 4+V*R*
1 series · 4 of 4 positions shown · non-contrast
Comparison: None.

CLINICAL DATA: Right knee pain.

EXAM:
RIGHT KNEE - COMPLETE 4+ VIEW

[Series 1: ap · 0.17mm/px · 4 of 4 slices shown]
[im 1/4]
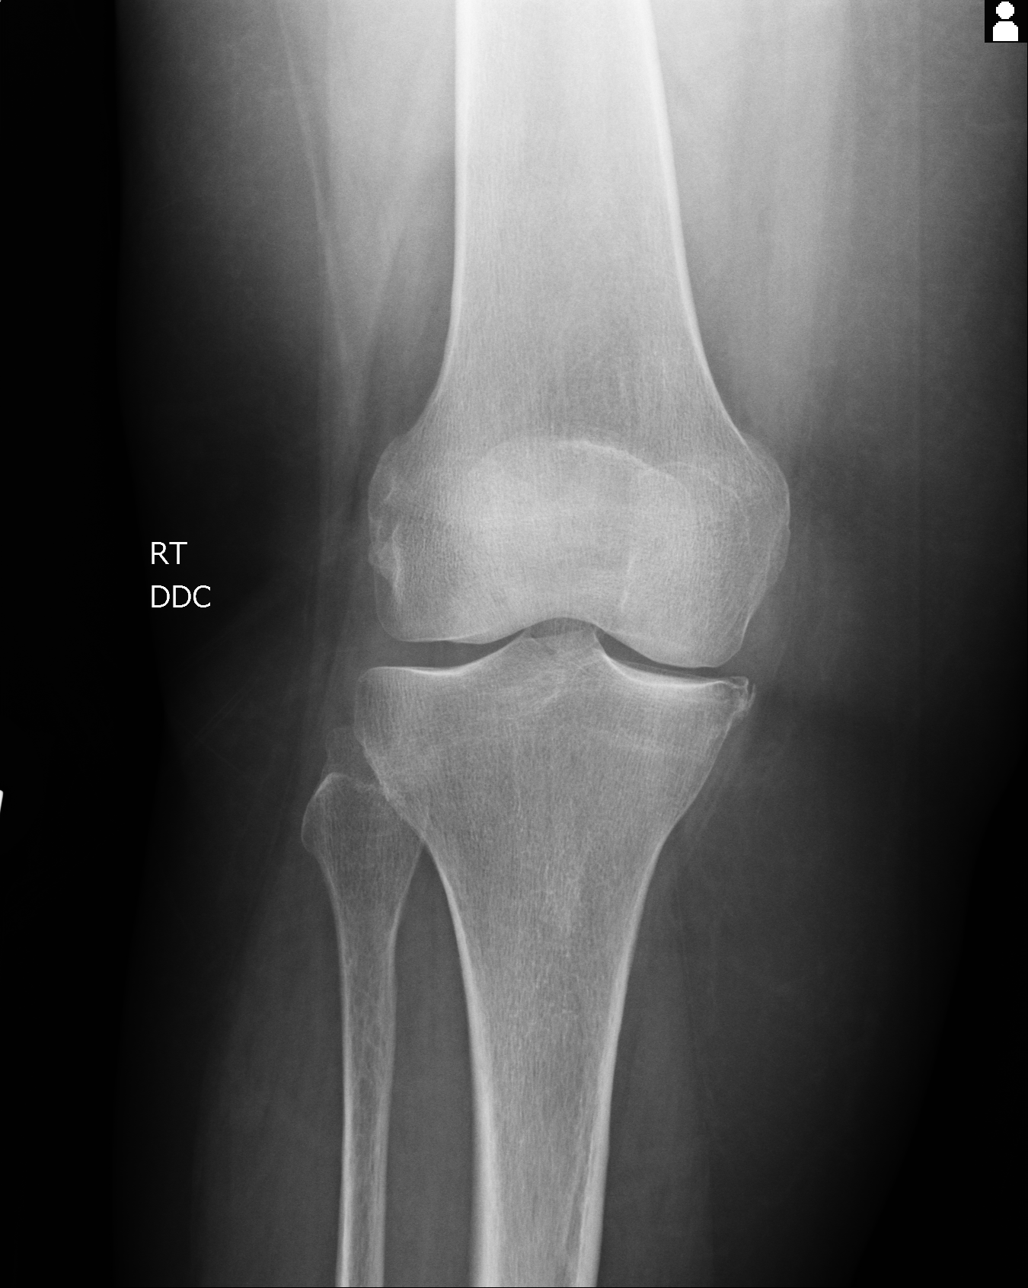
[im 2/4]
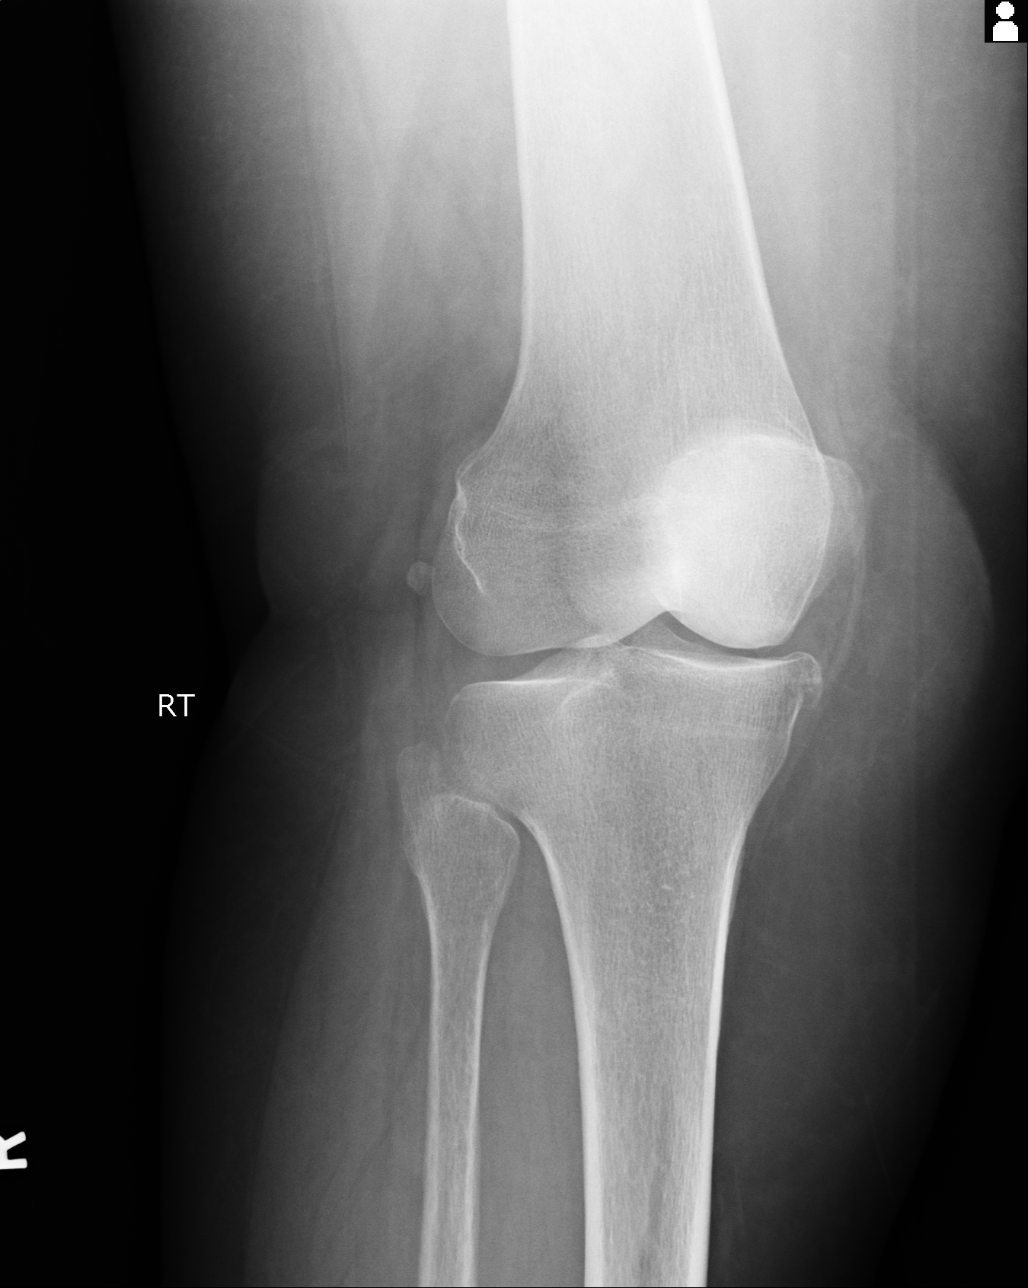
[im 3/4]
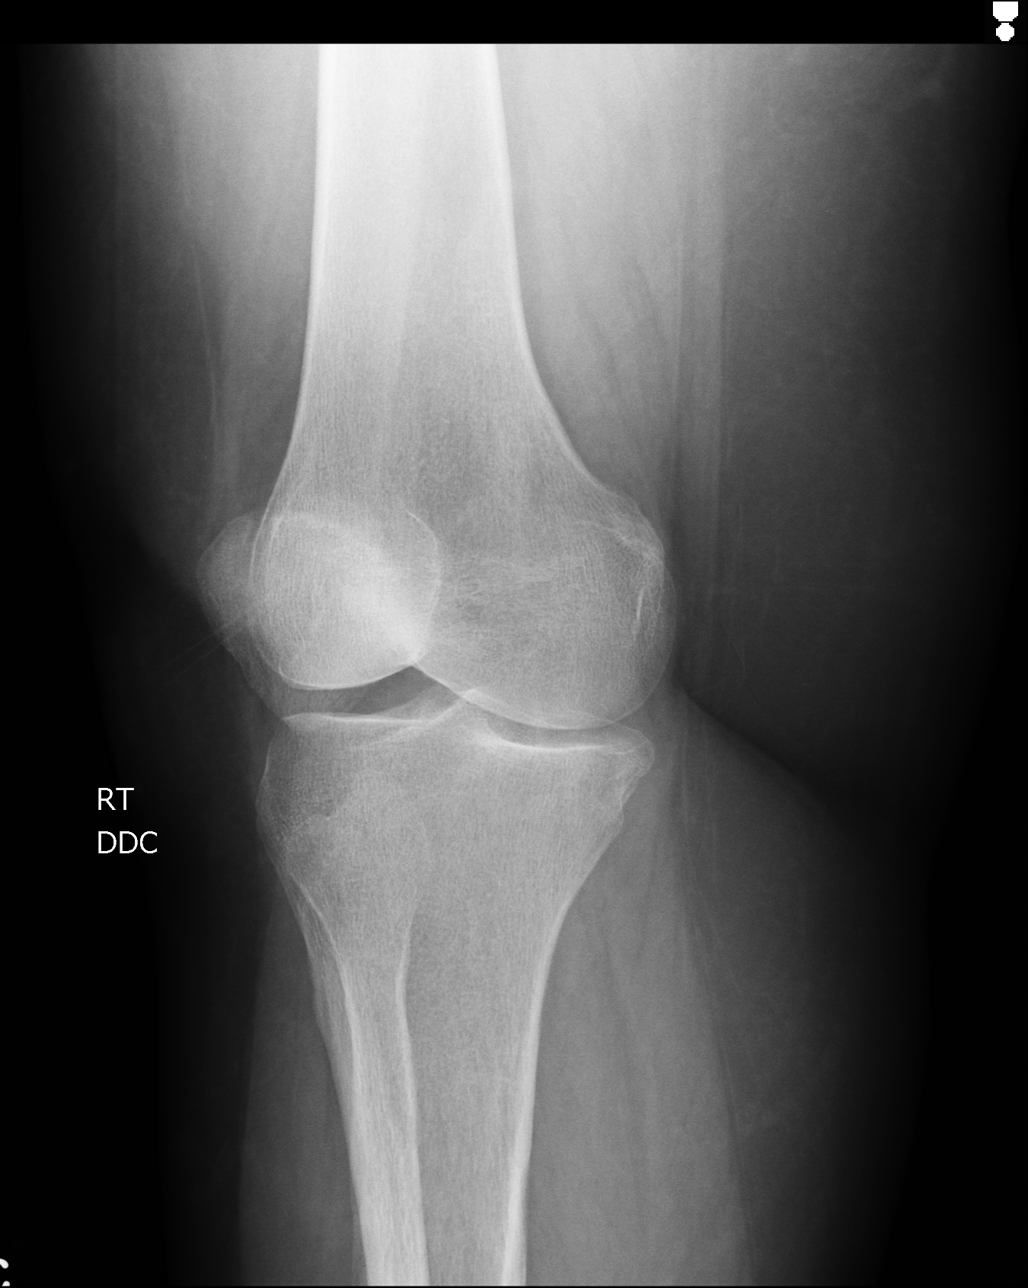
[im 4/4]
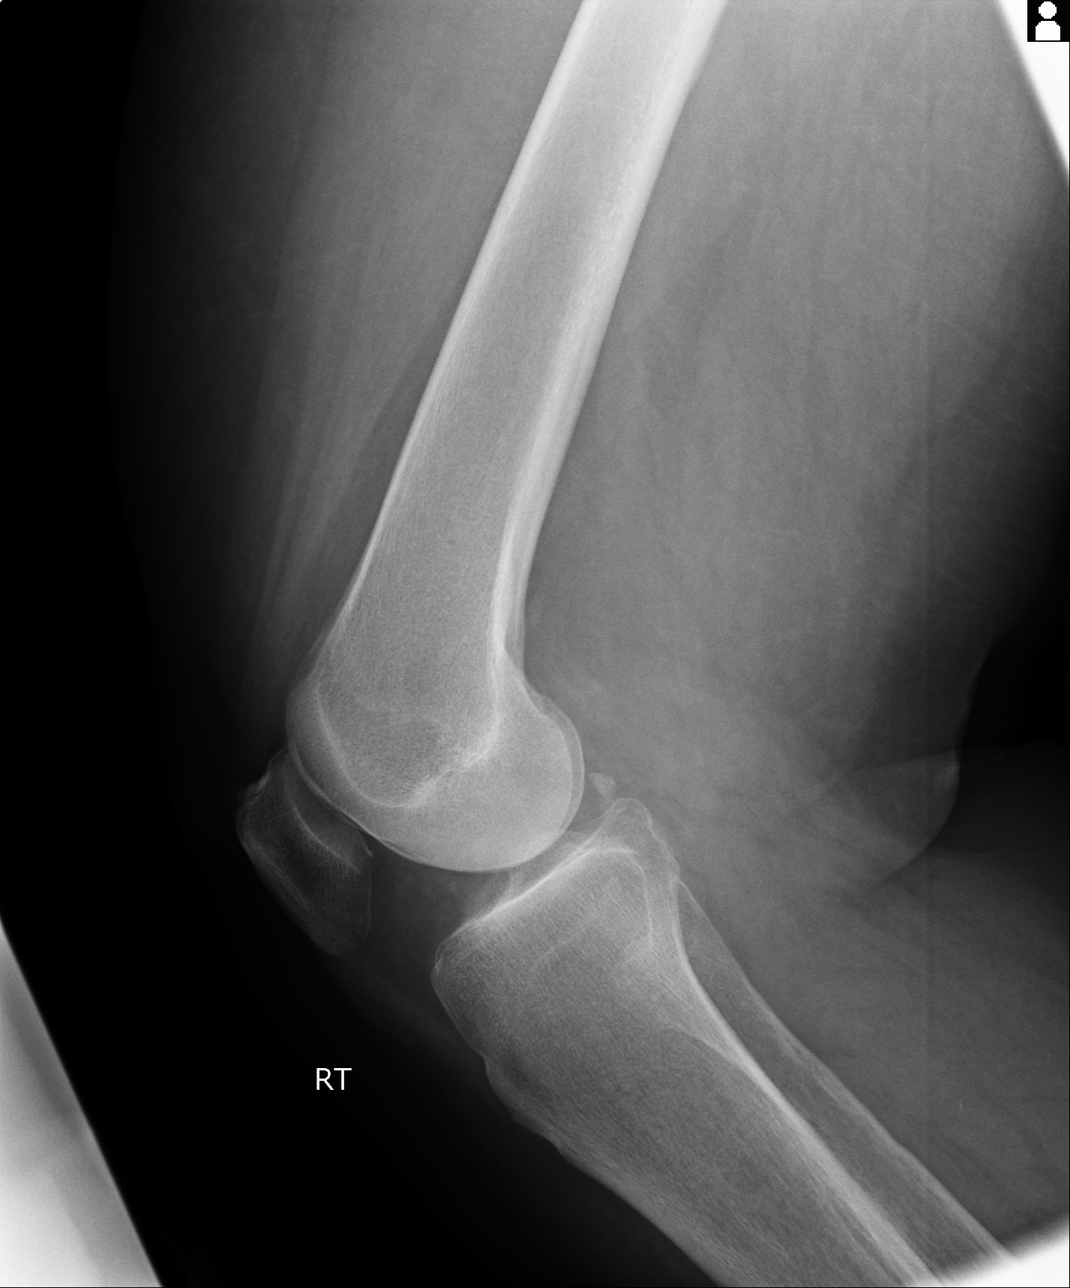

[4 of 4 positions shown; findings below may reference images not displayed]

FINDINGS: No fracture or dislocation is noted. No joint effusion is noted.
Mild narrowing of medial joint space is noted with osteophyte
formation. No soft tissue abnormality is noted.
IMPRESSION: Mild degenerative joint disease is noted medially. No acute
abnormality seen in the right knee.

## 2016-01-10 ENCOUNTER — Other Ambulatory Visit: Payer: Self-pay

## 2016-01-12 DIAGNOSIS — I10 Essential (primary) hypertension: Secondary | ICD-10-CM | POA: Diagnosis not present

## 2016-01-12 DIAGNOSIS — R5383 Other fatigue: Secondary | ICD-10-CM | POA: Diagnosis not present

## 2016-01-12 DIAGNOSIS — G4733 Obstructive sleep apnea (adult) (pediatric): Secondary | ICD-10-CM | POA: Diagnosis not present

## 2016-01-12 DIAGNOSIS — E782 Mixed hyperlipidemia: Secondary | ICD-10-CM | POA: Diagnosis not present

## 2016-01-12 DIAGNOSIS — K219 Gastro-esophageal reflux disease without esophagitis: Secondary | ICD-10-CM | POA: Diagnosis not present

## 2016-01-12 DIAGNOSIS — I480 Paroxysmal atrial fibrillation: Secondary | ICD-10-CM | POA: Diagnosis not present

## 2016-01-12 DIAGNOSIS — I495 Sick sinus syndrome: Secondary | ICD-10-CM | POA: Diagnosis not present

## 2016-02-07 DIAGNOSIS — E782 Mixed hyperlipidemia: Secondary | ICD-10-CM | POA: Diagnosis not present

## 2016-02-07 DIAGNOSIS — E559 Vitamin D deficiency, unspecified: Secondary | ICD-10-CM | POA: Diagnosis not present

## 2016-02-09 DIAGNOSIS — G4733 Obstructive sleep apnea (adult) (pediatric): Secondary | ICD-10-CM | POA: Diagnosis not present

## 2016-02-14 DIAGNOSIS — G4733 Obstructive sleep apnea (adult) (pediatric): Secondary | ICD-10-CM | POA: Diagnosis not present

## 2016-02-14 DIAGNOSIS — F5101 Primary insomnia: Secondary | ICD-10-CM | POA: Diagnosis not present

## 2016-02-14 DIAGNOSIS — Z23 Encounter for immunization: Secondary | ICD-10-CM | POA: Diagnosis not present

## 2016-02-14 DIAGNOSIS — I4891 Unspecified atrial fibrillation: Secondary | ICD-10-CM | POA: Diagnosis not present

## 2016-02-15 DIAGNOSIS — G4733 Obstructive sleep apnea (adult) (pediatric): Secondary | ICD-10-CM | POA: Diagnosis not present

## 2016-02-15 DIAGNOSIS — I4891 Unspecified atrial fibrillation: Secondary | ICD-10-CM | POA: Diagnosis not present

## 2016-02-15 DIAGNOSIS — F5101 Primary insomnia: Secondary | ICD-10-CM | POA: Diagnosis not present

## 2016-02-15 DIAGNOSIS — I10 Essential (primary) hypertension: Secondary | ICD-10-CM | POA: Diagnosis not present

## 2016-03-07 DIAGNOSIS — I480 Paroxysmal atrial fibrillation: Secondary | ICD-10-CM | POA: Diagnosis not present

## 2016-05-02 DIAGNOSIS — I480 Paroxysmal atrial fibrillation: Secondary | ICD-10-CM | POA: Diagnosis not present

## 2016-05-02 DIAGNOSIS — G4733 Obstructive sleep apnea (adult) (pediatric): Secondary | ICD-10-CM | POA: Diagnosis not present

## 2016-05-02 DIAGNOSIS — I495 Sick sinus syndrome: Secondary | ICD-10-CM | POA: Diagnosis not present

## 2016-05-02 DIAGNOSIS — E782 Mixed hyperlipidemia: Secondary | ICD-10-CM | POA: Diagnosis not present

## 2016-05-02 DIAGNOSIS — I10 Essential (primary) hypertension: Secondary | ICD-10-CM | POA: Diagnosis not present

## 2016-05-02 DIAGNOSIS — K219 Gastro-esophageal reflux disease without esophagitis: Secondary | ICD-10-CM | POA: Diagnosis not present

## 2016-05-29 DIAGNOSIS — I4891 Unspecified atrial fibrillation: Secondary | ICD-10-CM | POA: Diagnosis not present

## 2016-05-29 DIAGNOSIS — N393 Stress incontinence (female) (male): Secondary | ICD-10-CM | POA: Diagnosis not present

## 2016-05-29 DIAGNOSIS — D485 Neoplasm of uncertain behavior of skin: Secondary | ICD-10-CM | POA: Diagnosis not present

## 2016-05-29 DIAGNOSIS — E782 Mixed hyperlipidemia: Secondary | ICD-10-CM | POA: Diagnosis not present

## 2016-05-29 DIAGNOSIS — I10 Essential (primary) hypertension: Secondary | ICD-10-CM | POA: Diagnosis not present

## 2016-06-06 DIAGNOSIS — H25813 Combined forms of age-related cataract, bilateral: Secondary | ICD-10-CM | POA: Diagnosis not present

## 2016-07-11 DIAGNOSIS — D229 Melanocytic nevi, unspecified: Secondary | ICD-10-CM | POA: Diagnosis not present

## 2016-07-11 DIAGNOSIS — L720 Epidermal cyst: Secondary | ICD-10-CM | POA: Diagnosis not present

## 2016-07-31 DIAGNOSIS — L720 Epidermal cyst: Secondary | ICD-10-CM | POA: Diagnosis not present

## 2016-08-07 DIAGNOSIS — L720 Epidermal cyst: Secondary | ICD-10-CM | POA: Diagnosis not present

## 2016-08-23 DIAGNOSIS — G4733 Obstructive sleep apnea (adult) (pediatric): Secondary | ICD-10-CM | POA: Diagnosis not present

## 2016-08-28 DIAGNOSIS — Z1231 Encounter for screening mammogram for malignant neoplasm of breast: Secondary | ICD-10-CM | POA: Diagnosis not present

## 2016-08-29 ENCOUNTER — Encounter: Payer: Self-pay | Admitting: General Surgery

## 2016-09-07 ENCOUNTER — Ambulatory Visit (INDEPENDENT_AMBULATORY_CARE_PROVIDER_SITE_OTHER): Payer: Medicare Other | Admitting: General Surgery

## 2016-09-07 ENCOUNTER — Encounter: Payer: Self-pay | Admitting: General Surgery

## 2016-09-07 VITALS — BP 142/80 | HR 68 | Resp 14 | Ht 66.0 in | Wt 203.0 lb

## 2016-09-07 DIAGNOSIS — Z800 Family history of malignant neoplasm of digestive organs: Secondary | ICD-10-CM | POA: Diagnosis not present

## 2016-09-07 DIAGNOSIS — N6092 Unspecified benign mammary dysplasia of left breast: Secondary | ICD-10-CM

## 2016-09-07 DIAGNOSIS — N6019 Diffuse cystic mastopathy of unspecified breast: Secondary | ICD-10-CM

## 2016-09-07 NOTE — Progress Notes (Signed)
Patient ID: Cathy Curtis, female   DOB: 07-Jan-1941, 76 y.o.   MRN: [id number]  Chief Complaint  Patient presents with  . Follow-up    HPI Cathy Curtis is a 76 y.o. female who presents for a breast evaluation. The most recent mammogram was done on 08/28/2016 .  Patient does perform regular self breast checks and gets regular mammograms done.  No new breast issues.   HPI  Past Medical History:  Diagnosis Date  . Atrial fibrillation ([place]) 2016  . Atrial flutter ([place]) 2009  . Breast screening, unspecified   . Diffuse cystic mastopathy    FCD  . Family history of malignant neoplasm of gastrointestinal tract   . Hemorrhoids 2009   resolved  . Obesity, unspecified   . Osteoarthritis   . Osteoporosis   . Personal history of tobacco use, presenting hazards to health   . Screening for obesity   . Sleep apnea    admits to c-pap  . Special screening for malignant neoplasms, colon   . Unspecified essential hypertension 2002    Past Surgical History:  Procedure Laterality Date  . ABDOMINAL HYSTERECTOMY    . CARDIOVERSION  2009, 2010  . CHOLECYSTECTOMY    . COLONOSCOPY  2010,02/2014   Dr. Jamal Collin, [facility]  . HEMORRHOID SURGERY  09-06-2007   stapled  . PACEMAKER INSERTION Left 09/2011  . SALPINGOOPHORECTOMY  1974    Family History  Problem Relation Age of Onset  . Colon cancer Father   . Colon cancer Sister   . Colon cancer Sister     Social History Social History  Substance Use Topics  . Smoking status: Former Smoker    Packs/day: 1.00    Years: 20.00    Types: Cigarettes    Quit date: 05/16/1991  . Smokeless tobacco: Never Used  . Alcohol use Yes     Comment: occasionally    Allergies  Allergen Reactions  . Prevacid [Lansoprazole] Other (See Comments)    whelps    Current Outpatient Prescriptions  Medication Sig Dispense Refill  . amLODipine (NORVASC) 5 MG tablet Take 5 mg by mouth daily.    Marland Kitchen apixaban (ELIQUIS) 5 MG TABS tablet Take 5 mg by mouth 2 (two) times  daily.    Marland Kitchen atorvastatin (LIPITOR) 10 MG tablet 1 tablet once daily.    Marland Kitchen CALCIUM-VITAMIN D PO Take by mouth.    . Cholecalciferol (VITAMIN D-3) 1000 UNITS CAPS Take by mouth daily.    . metoprolol tartrate (LOPRESSOR) 25 MG tablet Take 25 mg by mouth 2 (two) times daily.    Marland Kitchen omeprazole (PRILOSEC) 40 MG capsule     . oxybutynin (DITROPAN-XL) 5 MG 24 hr tablet Take 5 mg by mouth at bedtime.     Marland Kitchen zolpidem (AMBIEN CR) 12.5 MG CR tablet      No current facility-administered medications for this visit.     Review of Systems Review of Systems  Constitutional: Negative.   Respiratory: Negative.   Cardiovascular: Negative.     Blood pressure (!) 142/80, pulse 68, resp. rate 14, height 5\' 6"  (1.676 m), weight 203 lb (92.1 kg).  Physical Exam Physical Exam  Constitutional: She is oriented to person, place, and time. She appears well-developed and well-nourished.  Eyes: Conjunctivae are normal. No scleral icterus.  Neck: Neck supple.  Cardiovascular: Normal rate, regular rhythm and normal heart sounds.   Pulmonary/Chest: Effort normal and breath sounds normal. Right breast exhibits no inverted nipple, no mass, no nipple discharge, no  skin change and no tenderness. Left breast exhibits no inverted nipple, no mass, no nipple discharge, no skin change and no tenderness.  Abdominal: Soft. Normal appearance and bowel sounds are normal. There is no hepatomegaly. There is no tenderness. No hernia.  Lymphadenopathy:    She has no cervical adenopathy.    She has no axillary adenopathy.  Neurological: She is alert and oriented to person, place, and time.  Skin: Skin is warm and dry.    Data Reviewed Mammogram reviewed - stable.   Assessment    Stable exam. History of fibrocystic disease. FH of colon cancer.    Plan    Patient will be asked to return to the office in one year with a bilateral screening mammogram with Dr. Bary Castilla. Colonoscopy due in 2020 HPI, Physical Exam, Assessment and  Plan have been scribed under the direction and in the presence of Mckinley Jewel, MD  Gaspar Cola, CMA   I have completed the exam and reviewed the above documentation for accuracy and completeness.  I agree with the above.  Haematologist has been used and any errors in dictation or transcription are unintentional.  Seeplaputhur G. Jamal Collin, M.D., F.A.C.S.  Junie Panning G 09/07/2016, 10:22 AM

## 2016-09-07 NOTE — Patient Instructions (Addendum)
Patient will be asked to return to the office in one year with a bilateral screening mammogram with Dr. Byrnett.  

## 2016-10-02 DIAGNOSIS — I10 Essential (primary) hypertension: Secondary | ICD-10-CM | POA: Diagnosis not present

## 2016-10-02 DIAGNOSIS — F5101 Primary insomnia: Secondary | ICD-10-CM | POA: Diagnosis not present

## 2016-10-02 DIAGNOSIS — K219 Gastro-esophageal reflux disease without esophagitis: Secondary | ICD-10-CM | POA: Diagnosis not present

## 2016-10-02 DIAGNOSIS — G4733 Obstructive sleep apnea (adult) (pediatric): Secondary | ICD-10-CM | POA: Diagnosis not present

## 2016-10-02 DIAGNOSIS — E782 Mixed hyperlipidemia: Secondary | ICD-10-CM | POA: Diagnosis not present

## 2016-11-02 DIAGNOSIS — I340 Nonrheumatic mitral (valve) insufficiency: Secondary | ICD-10-CM | POA: Insufficient documentation

## 2016-11-02 DIAGNOSIS — I10 Essential (primary) hypertension: Secondary | ICD-10-CM | POA: Diagnosis not present

## 2016-11-02 DIAGNOSIS — I480 Paroxysmal atrial fibrillation: Secondary | ICD-10-CM | POA: Diagnosis not present

## 2016-11-02 DIAGNOSIS — G4733 Obstructive sleep apnea (adult) (pediatric): Secondary | ICD-10-CM | POA: Diagnosis not present

## 2017-02-12 DIAGNOSIS — Z23 Encounter for immunization: Secondary | ICD-10-CM | POA: Diagnosis not present

## 2017-02-19 DIAGNOSIS — G4733 Obstructive sleep apnea (adult) (pediatric): Secondary | ICD-10-CM | POA: Diagnosis not present

## 2017-02-19 DIAGNOSIS — F5101 Primary insomnia: Secondary | ICD-10-CM | POA: Diagnosis not present

## 2017-04-18 DIAGNOSIS — I480 Paroxysmal atrial fibrillation: Secondary | ICD-10-CM | POA: Diagnosis not present

## 2017-04-18 DIAGNOSIS — G4733 Obstructive sleep apnea (adult) (pediatric): Secondary | ICD-10-CM | POA: Diagnosis not present

## 2017-04-18 DIAGNOSIS — E782 Mixed hyperlipidemia: Secondary | ICD-10-CM | POA: Diagnosis not present

## 2017-04-18 DIAGNOSIS — I340 Nonrheumatic mitral (valve) insufficiency: Secondary | ICD-10-CM | POA: Diagnosis not present

## 2017-04-18 DIAGNOSIS — R5383 Other fatigue: Secondary | ICD-10-CM | POA: Diagnosis not present

## 2017-04-18 DIAGNOSIS — I495 Sick sinus syndrome: Secondary | ICD-10-CM | POA: Diagnosis not present

## 2017-04-18 DIAGNOSIS — I10 Essential (primary) hypertension: Secondary | ICD-10-CM | POA: Diagnosis not present

## 2017-05-01 ENCOUNTER — Ambulatory Visit (INDEPENDENT_AMBULATORY_CARE_PROVIDER_SITE_OTHER): Payer: Medicare Other | Admitting: Internal Medicine

## 2017-05-01 VITALS — BP 152/92 | HR 71 | Resp 16 | Ht 66.0 in | Wt 201.4 lb

## 2017-05-01 DIAGNOSIS — R5383 Other fatigue: Secondary | ICD-10-CM | POA: Diagnosis not present

## 2017-05-01 DIAGNOSIS — E782 Mixed hyperlipidemia: Secondary | ICD-10-CM

## 2017-05-01 DIAGNOSIS — Z0001 Encounter for general adult medical examination with abnormal findings: Secondary | ICD-10-CM | POA: Diagnosis not present

## 2017-05-01 DIAGNOSIS — F320 Major depressive disorder, single episode, mild: Secondary | ICD-10-CM | POA: Diagnosis not present

## 2017-05-01 DIAGNOSIS — I495 Sick sinus syndrome: Secondary | ICD-10-CM | POA: Insufficient documentation

## 2017-05-01 DIAGNOSIS — I10 Essential (primary) hypertension: Secondary | ICD-10-CM | POA: Diagnosis not present

## 2017-05-01 DIAGNOSIS — K219 Gastro-esophageal reflux disease without esophagitis: Secondary | ICD-10-CM | POA: Insufficient documentation

## 2017-05-01 DIAGNOSIS — Z7901 Long term (current) use of anticoagulants: Secondary | ICD-10-CM

## 2017-05-01 DIAGNOSIS — Z950 Presence of cardiac pacemaker: Secondary | ICD-10-CM

## 2017-05-01 MED ORDER — DULOXETINE HCL 20 MG PO CPEP: 20.0000 mg | ORAL_CAPSULE | Freq: Every day | ORAL | 3 refills | Status: DC

## 2017-05-01 NOTE — Progress Notes (Signed)
[facility] [place], [place] [address]  Internal MEDICINE  Office Visit Note  Patient Name: Cathy Curtis  [id number]  [id number]  Date of Service: 05/01/2017  Complaints/HPI: C/O Fatigue, pacemaker for 3 years  ? hypothroidism or anemia. Sleeps well with CPAP and Lorrin Mais  Denies any chest pain  Current Medication: Outpatient Encounter Medications as of 05/01/2017  Medication Sig  . acetaminophen (TYLENOL) 500 MG tablet Take 500 mg by mouth every 6 (six) hours as needed.  Marland Kitchen amLODipine (NORVASC) 5 MG tablet Take 5 mg by mouth daily.  Marland Kitchen apixaban (ELIQUIS) 5 MG TABS tablet Take 5 mg by mouth 2 (two) times daily.  Marland Kitchen atorvastatin (LIPITOR) 10 MG tablet 1 tablet once daily.  Marland Kitchen CALCIUM-VITAMIN D PO Take by mouth.  . Cholecalciferol (VITAMIN D-3) 1000 UNITS CAPS Take by mouth daily.  . Coenzyme Q10 (CO Q-10) 100 MG CAPS Take by mouth daily.  . metoprolol tartrate (LOPRESSOR) 25 MG tablet Take 25 mg by mouth 2 (two) times daily.  Marland Kitchen omeprazole (PRILOSEC) 40 MG capsule   . zolpidem (AMBIEN CR) 12.5 MG CR tablet   . [DISCONTINUED] oxybutynin (DITROPAN-XL) 5 MG 24 hr tablet Take 5 mg by mouth at bedtime.    No facility-administered encounter medications on file as of 05/01/2017.     Surgical History: Past Surgical History:  Procedure Laterality Date  . ABDOMINAL HYSTERECTOMY    . CARDIOVERSION  2009, 2010  . CHOLECYSTECTOMY    . COLONOSCOPY  2010,02/2014   Dr. Jamal Collin, [facility]  . HEMORRHOID SURGERY  09-06-2007   stapled  . PACEMAKER INSERTION Left 09/2011  . SALPINGOOPHORECTOMY  1974    Medical History: Past Medical History:  Diagnosis Date  . Atrial fibrillation ([place]) 2016  . Atrial flutter ([place]) 2009  . Breast screening, unspecified   . Diffuse cystic mastopathy    FCD  . Family history of malignant neoplasm of gastrointestinal tract   . Hemorrhoids 2009   resolved  . Obesity, unspecified   . Osteoarthritis   . Osteoporosis   . Personal history of  tobacco use, presenting hazards to health   . Screening for obesity   . Sleep apnea    admits to c-pap  . Special screening for malignant neoplasms, colon   . Unspecified essential hypertension 2002    Family History: Family History  Problem Relation Age of Onset  . Colon cancer Father   . Colon cancer Sister   . Colon cancer Sister       Review of Systems  Constitutional: Positive for fever and malaise/fatigue.  Eyes: Negative for blurred vision.  Skin: Negative for rash.  Neurological: Positive for sensory change. Negative for dizziness.  Psychiatric/Behavioral: Positive for depression. Negative for memory loss, substance abuse and suicidal ideas. The patient does not have insomnia.   General: (-) fever, (-) chills, (-) night sweats, (admits fatigue , (-) changes in appetite. Skin: (-) rashes, (-) itching,. Eyes: (-) visual changes, (-) redness, (-) itching, (-) double or blurred vision. Nose and Sinuses: (-) nasal stuffiness or itchiness, (-) postnasal drip, (-) nosebleeds, (-) sinus trouble. Mouth and Throat: (-) sore throat, (-) hoarseness. Neck: (-) swollen glands, (-) enlarged thyroid, (-) neck pain. Respiratory: (-) cough, (-) bloody sputum, (-) shortness of breath, (-) wheezing. Cardiovascular: (-) ankle swelling, (-) chest pain. Lymphatic: (-) lymph node enlargement, (-) lymph node tenderness. Neurologic: (-) numbness, (-) tingling,(-) dizziness. Psychiatric: (-) anxiety, (-) depression.  Vital Signs: Blood pressure (!) 152/92, pulse 71,  resp. rate 16, height 5\' 6"  (1.676 m), weight 201 lb 6.4 oz (91.4 kg), SpO2 100 %.  Examination: General Appearance: The patient is well-developed, well-nourished, and in no distress. Skin: Gross inspection of skin demonstrates no evidence of abnormality. Head: Patient's head is normocephalic, no gross deformities. Eyes: no gross deformities noted. ENT: ears appear grossly normal. Nasopharynx appears to be normal. Neck: Supple.  No thyromegaly. No LAD. Respiratory: Lungs are clear to auscultation with no adventitious sounds. Cardiovascular: Normal S1 and S2 without murmur or rub. Extremities: No cyanosis. pulses are equal. Neurologic: Alert and oriented. No involuntary movements.  Labs reviewed .Marland Kitchen Not on file    Assessment and Plan: Patient Active Problem List   Diagnosis Date Noted  . GERD (gastroesophageal reflux disease) 05/01/2017  . Sick sinus syndrome ([place]) 05/01/2017  . Moderate mitral insufficiency 11/02/2016  . Benign essential hypertension 03/01/2015  . Cardiac pacemaker 03/01/2015  . Hyperlipemia, mixed 03/06/2014  . Obstructive sleep apnea 03/06/2014  . Diffuse cystic mastopathy 08/06/2012  . Family history of malignant neoplasm of gastrointestinal tract 08/06/2012  1. Encounter for general adult medical examination with abnormal findings  - CBC with Differential/Platelet - Lipid Panel With LDL/HDL Ratio - TSH - T4, free - Comprehensive metabolic panel - Urinalysis  2. Benign essential hypertension - BP is elevated, will adjust medications   3. Other fatigue - Can be releated to B blocker use, will monitor for now   4. Hyperlipemia, mixed - Controlled   5. Cardiac pacemaker - S/P per cardiology  6. Depression, major, single episode, mild (HCC) - Borderline controlled  7. Chronic anticoagulation - Continue per cardiology    General Counseling: I have discussed the findings of the evaluation and examination with Cathy Curtis.  I have also discussed any further diagnostic evaluation thatmay be needed or ordered today. Cathy Curtis verbalizes understanding of the findings of todays visit. We also reviewed her medications today and discussed drug interactions and side effects including but not limited excessive drowsiness and altered mental states. We also discussed that there is always a risk not just to her but also people around her. she has been encouraged to call the office with any questions  or concerns that should arise related to todays visit.  Hypertension Counseling:   The following hypertensive lifestyle modification were recommended and discussed:  1. Limiting alcohol intake to less than 1 oz/day of ethanol:(24 oz of beer or 8 oz of wine or 2 oz of 100-proof whiskey). 2. Take baby ASA 81 mg daily. 3. Importance of regular aerobic exercise and losing weight. 4. Reduce dietary saturated fat and cholesterol intake for overall cardiovascular health. 5. Maintaining adequate dietary potassium, calcium, and magnesium intake. 6. Regular monitoring of the blood pressure. 7. Reduce sodium intake to less than 100 mmol/day (less than 2.3 gm of sodium or less than 6 gm of sodium choride)   Time spent: 45 min  I have personally obtained a history, examined the patient, evaluated laboratory and imaging results, formulated the assessment and plan and placed orders.   Lavera Guise, MD  Internal Medicine

## 2017-05-02 LAB — URINALYSIS
BILIRUBIN UA: NEGATIVE
GLUCOSE, UA: NEGATIVE
Ketones, UA: NEGATIVE
Leukocytes, UA: NEGATIVE
Nitrite, UA: NEGATIVE
PROTEIN UA: NEGATIVE
RBC, UA: NEGATIVE
SPEC GRAV UA: 1.027 (ref 1.005–1.030)
Urobilinogen, Ur: 0.2 mg/dL (ref 0.2–1.0)
pH, UA: 5.5 (ref 5.0–7.5)

## 2017-05-03 ENCOUNTER — Other Ambulatory Visit: Payer: Self-pay

## 2017-05-03 MED ORDER — AMLODIPINE BESYLATE 10 MG PO TABS: 10.0000 mg | ORAL_TABLET | Freq: Every day | ORAL | 3 refills | Status: DC

## 2017-05-04 ENCOUNTER — Other Ambulatory Visit: Payer: Self-pay | Admitting: Internal Medicine

## 2017-05-04 DIAGNOSIS — E0781 Sick-euthyroid syndrome: Secondary | ICD-10-CM | POA: Diagnosis not present

## 2017-05-04 DIAGNOSIS — Z0001 Encounter for general adult medical examination with abnormal findings: Secondary | ICD-10-CM | POA: Diagnosis not present

## 2017-05-04 DIAGNOSIS — E785 Hyperlipidemia, unspecified: Secondary | ICD-10-CM | POA: Diagnosis not present

## 2017-05-04 DIAGNOSIS — R5383 Other fatigue: Secondary | ICD-10-CM | POA: Diagnosis not present

## 2017-05-05 LAB — CBC WITH DIFFERENTIAL/PLATELET
BASOS: 1 %
Basophils Absolute: 0 10*3/uL (ref 0.0–0.2)
EOS (ABSOLUTE): 0.3 10*3/uL (ref 0.0–0.4)
Eos: 4 %
HEMATOCRIT: 39.5 % (ref 34.0–46.6)
Hemoglobin: 12.7 g/dL (ref 11.1–15.9)
Immature Grans (Abs): 0 10*3/uL (ref 0.0–0.1)
Immature Granulocytes: 0 %
Lymphocytes Absolute: 2.5 10*3/uL (ref 0.7–3.1)
Lymphs: 36 %
MCH: 30 pg (ref 26.6–33.0)
MCHC: 32.2 g/dL (ref 31.5–35.7)
MCV: 93 fL (ref 79–97)
MONOS ABS: 0.5 10*3/uL (ref 0.1–0.9)
Monocytes: 7 %
NEUTROS ABS: 3.8 10*3/uL (ref 1.4–7.0)
Neutrophils: 52 %
PLATELETS: 196 10*3/uL (ref 150–379)
RBC: 4.24 x10E6/uL (ref 3.77–5.28)
RDW: 14.4 % (ref 12.3–15.4)
WBC: 7 10*3/uL (ref 3.4–10.8)

## 2017-05-05 LAB — LIPID PANEL WITH LDL/HDL RATIO
Cholesterol, Total: 143 mg/dL (ref 100–199)
HDL: 57 mg/dL (ref >39)
LDL Calculated: 66 mg/dL (ref 0–99)
LDL/HDL RATIO: 1.2 ratio (ref 0.0–3.2)
Triglycerides: 100 mg/dL (ref 0–149)
VLDL Cholesterol Cal: 20 mg/dL (ref 5–40)

## 2017-05-05 LAB — COMPREHENSIVE METABOLIC PANEL
A/G RATIO: 1.4 (ref 1.2–2.2)
ALT: 11 IU/L (ref 0–32)
AST: 15 IU/L (ref 0–40)
Albumin: 4.3 g/dL (ref 3.5–4.8)
Alkaline Phosphatase: 50 IU/L (ref 39–117)
BILIRUBIN TOTAL: 0.4 mg/dL (ref 0.0–1.2)
BUN/Creatinine Ratio: 15 (ref 12–28)
BUN: 15 mg/dL (ref 8–27)
CHLORIDE: 104 mmol/L (ref 96–106)
CO2: 22 mmol/L (ref 20–29)
Calcium: 9.9 mg/dL (ref 8.7–10.3)
Creatinine, Ser: 1.02 mg/dL — ABNORMAL HIGH (ref 0.57–1.00)
GFR calc non Af Amer: 54 mL/min/{1.73_m2} — ABNORMAL LOW (ref >59)
GFR, EST AFRICAN AMERICAN: 63 mL/min/{1.73_m2} (ref >59)
GLOBULIN, TOTAL: 3 g/dL (ref 1.5–4.5)
Glucose: 93 mg/dL (ref 65–99)
POTASSIUM: 4.8 mmol/L (ref 3.5–5.2)
SODIUM: 143 mmol/L (ref 134–144)
TOTAL PROTEIN: 7.3 g/dL (ref 6.0–8.5)

## 2017-05-05 LAB — T4, FREE: FREE T4: 1.26 ng/dL (ref 0.82–1.77)

## 2017-05-05 LAB — TSH: TSH: 0.565 u[IU]/mL (ref 0.450–4.500)

## 2017-05-24 ENCOUNTER — Encounter: Payer: Self-pay | Admitting: Internal Medicine

## 2017-06-12 ENCOUNTER — Ambulatory Visit: Payer: Self-pay | Admitting: Internal Medicine

## 2017-06-13 ENCOUNTER — Other Ambulatory Visit: Payer: Self-pay | Admitting: Nurse Practitioner

## 2017-06-13 DIAGNOSIS — F5101 Primary insomnia: Secondary | ICD-10-CM

## 2017-06-13 MED ORDER — ZOLPIDEM TARTRATE ER 12.5 MG PO TBCR
12.5000 mg | EXTENDED_RELEASE_TABLET | Freq: Every evening | ORAL | 2 refills | Status: DC | PRN
Start: 2017-06-13 — End: 2017-10-12

## 2017-07-04 ENCOUNTER — Ambulatory Visit (INDEPENDENT_AMBULATORY_CARE_PROVIDER_SITE_OTHER): Payer: Medicare Other | Admitting: Internal Medicine

## 2017-07-04 ENCOUNTER — Encounter: Payer: Self-pay | Admitting: Internal Medicine

## 2017-07-04 VITALS — BP 138/72 | HR 60 | Resp 16 | Ht 66.0 in | Wt 202.0 lb

## 2017-07-04 DIAGNOSIS — M25562 Pain in left knee: Secondary | ICD-10-CM

## 2017-07-04 DIAGNOSIS — I10 Essential (primary) hypertension: Secondary | ICD-10-CM

## 2017-07-04 DIAGNOSIS — N393 Stress incontinence (female) (male): Secondary | ICD-10-CM | POA: Diagnosis not present

## 2017-07-04 DIAGNOSIS — R5383 Other fatigue: Secondary | ICD-10-CM

## 2017-07-04 DIAGNOSIS — M25561 Pain in right knee: Secondary | ICD-10-CM | POA: Diagnosis not present

## 2017-07-04 DIAGNOSIS — G8929 Other chronic pain: Secondary | ICD-10-CM | POA: Diagnosis not present

## 2017-07-04 DIAGNOSIS — I480 Paroxysmal atrial fibrillation: Secondary | ICD-10-CM | POA: Insufficient documentation

## 2017-07-04 MED ORDER — DICLOFENAC SODIUM 1 % TD GEL: 4.0000 g | Freq: Two times a day (BID) | TRANSDERMAL | 12 refills | Status: DC

## 2017-07-04 MED ORDER — CELECOXIB 200 MG PO CAPS: 200.0000 mg | ORAL_CAPSULE | Freq: Every day | ORAL | 12 refills | Status: DC

## 2017-07-04 NOTE — Progress Notes (Signed)
[facility] [place], [place] [address]  Internal MEDICINE  Office Visit Note  Patient Name: Cathy Curtis  [id number]  [id number]  Date of Service: 07/22/2017  Chief Complaint  Patient presents with  . Hypertension  . Depression  . Osteoarthritis   HPI  Pt is here for routine follow up. F/U on depression medications.She didn't like Cymbalta, bp is betetr but Increased lower ext edema due to norvasc. Denies any other problems, compliant with her CPAP C/O pain in both knees. Difficulty walking    Current Medication: Outpatient Encounter Medications as of 07/04/2017  Medication Sig  . acetaminophen (TYLENOL) 500 MG tablet Take 500 mg by mouth every 6 (six) hours as needed.  Marland Kitchen amLODipine (NORVASC) 10 MG tablet Take 1 tablet (10 mg total) by mouth daily.  Marland Kitchen apixaban (ELIQUIS) 5 MG TABS tablet Take 5 mg by mouth 2 (two) times daily.  Marland Kitchen atorvastatin (LIPITOR) 10 MG tablet 1 tablet once daily.  . Calcium Citrate-Vitamin D 200-250 MG-UNIT TABS Take by mouth.  . celecoxib (CELEBREX) 200 MG capsule Take 1 capsule (200 mg total) by mouth daily.  . Cholecalciferol (VITAMIN D-3) 1000 UNITS CAPS Take by mouth daily.  . Coenzyme Q10 (CO Q-10) 100 MG CAPS Take by mouth daily.  . metoprolol tartrate (LOPRESSOR) 25 MG tablet Take 25 mg by mouth 2 (two) times daily.  Marland Kitchen oxybutynin (DITROPAN-XL) 5 MG 24 hr tablet Take by mouth.  . zolpidem (AMBIEN CR) 12.5 MG CR tablet Take 1 tablet (12.5 mg total) by mouth at bedtime as needed for sleep.  . [DISCONTINUED] celecoxib (CELEBREX) 200 MG capsule Take by mouth.  . [DISCONTINUED] omeprazole (PRILOSEC) 40 MG capsule   . diclofenac sodium (VOLTAREN) 1 % GEL Apply 4 g topically 2 (two) times daily.  . [DISCONTINUED] amLODipine (NORVASC) 5 MG tablet Take 5 mg by mouth daily.  . [DISCONTINUED] CALCIUM-VITAMIN D PO Take by mouth.  . [DISCONTINUED] DULoxetine (CYMBALTA) 20 MG capsule Take 1 capsule (20 mg total) by mouth daily. (Patient  not taking: Reported on 07/04/2017)  . [DISCONTINUED] oxybutynin (DITROPAN-XL) 10 MG 24 hr tablet    No facility-administered encounter medications on file as of 07/04/2017.     Surgical History: Past Surgical History:  Procedure Laterality Date  . ABDOMINAL HYSTERECTOMY    . CARDIOVERSION  2009, 2010  . CHOLECYSTECTOMY    . COLONOSCOPY  2010,02/2014   Dr. Jamal Collin, [facility]  . HEMORRHOID SURGERY  09-06-2007   stapled  . PACEMAKER INSERTION Left 09/2011  . SALPINGOOPHORECTOMY  1974    Medical History: Past Medical History:  Diagnosis Date  . Atrial fibrillation ([place]) 2016  . Atrial flutter ([place]) 2009  . Breast screening, unspecified   . Diffuse cystic mastopathy    FCD  . Family history of malignant neoplasm of gastrointestinal tract   . Hemorrhoids 2009   resolved  . Obesity, unspecified   . Osteoarthritis   . Osteoporosis   . Personal history of tobacco use, presenting hazards to health   . Screening for obesity   . Sleep apnea    admits to c-pap  . Special screening for malignant neoplasms, colon   . Unspecified essential hypertension 2002    Family History: Family History  Problem Relation Age of Onset  . Colon cancer Father   . Colon cancer Sister   . Colon cancer Sister     Social History   Socioeconomic History  . Marital status: Married    Spouse name: Not  on file  . Number of children: Not on file  . Years of education: Not on file  . Highest education level: Not on file  Social Needs  . Financial resource strain: Not on file  . Food insecurity - worry: Not on file  . Food insecurity - inability: Not on file  . Transportation needs - medical: Not on file  . Transportation needs - non-medical: Not on file  Occupational History  . Not on file  Tobacco Use  . Smoking status: Former Smoker    Packs/day: 1.00    Years: 20.00    Pack years: 20.00    Types: Cigarettes    Last attempt to quit: 05/16/1991    Years since quitting: 26.2  . Smokeless  tobacco: Never Used  Substance and Sexual Activity  . Alcohol use: Yes    Comment: occasionally  . Drug use: No  . Sexual activity: Not on file  Other Topics Concern  . Not on file  Social History Narrative  . Not on file    Review of Systems  Constitutional: Negative for chills, fatigue and unexpected weight change.  HENT: Positive for postnasal drip. Negative for congestion, rhinorrhea, sneezing and sore throat.   Eyes: Negative for redness.  Respiratory: Negative for cough, chest tightness and shortness of breath.   Cardiovascular: Negative for chest pain and palpitations.  Gastrointestinal: Negative for abdominal pain, constipation, diarrhea, nausea and vomiting.  Genitourinary: Negative for dysuria and frequency.  Musculoskeletal: Positive for back pain and joint swelling. Negative for arthralgias and neck pain.  Skin: Negative for rash.  Neurological: Negative.  Negative for tremors and numbness.  Hematological: Negative for adenopathy. Does not bruise/bleed easily.  Psychiatric/Behavioral: Negative for behavioral problems (Depression), sleep disturbance and suicidal ideas. The patient is not nervous/anxious.     Vital Signs: BP 138/72 (BP Location: Left Arm, Patient Position: Sitting)   Pulse 60   Resp 16   Ht 5\' 6"  (1.676 m)   Wt 202 lb (91.6 kg)   SpO2 95%   BMI 32.60 kg/m    Physical Exam  Constitutional: She is oriented to person, place, and time. She appears well-developed and well-nourished. No distress.  HENT:  Head: Normocephalic and atraumatic.  Mouth/Throat: Oropharynx is clear and moist. No oropharyngeal exudate.  Eyes: EOM are normal. Pupils are equal, round, and reactive to light.  Neck: Normal range of motion. Neck supple. No JVD present. No tracheal deviation present. No thyromegaly present.  Cardiovascular: Normal rate, regular rhythm and normal heart sounds. Exam reveals no gallop and no friction rub.  No murmur heard. Pulmonary/Chest: Effort  normal. No respiratory distress. She has no wheezes. She has no rales. She exhibits no tenderness.  Abdominal: Soft. Bowel sounds are normal.  Musculoskeletal: Normal range of motion.  Right knee pain  Lymphadenopathy:    She has no cervical adenopathy.  Neurological: She is alert and oriented to person, place, and time. No cranial nerve deficit.  Skin: Skin is warm and dry. She is not diaphoretic.  Psychiatric: She has a normal mood and affect. Her behavior is normal. Judgment and thought content normal.   Assessment/Plan: 1. Other fatigue Monitor for now, pt was educated about side effects of b blockers   2. Benign essential hypertension Improved since previous visit  3. Chronic pain of both knees - Ambulatory referral to Orthopedic Surgery - diclofenac sodium (VOLTAREN) 1 % GEL; Apply 4 g topically 2 (two) times daily.  Dispense: 4 Tube; Refill: 12 - celecoxib (  CELEBREX) 200 MG capsule; Take 1 capsule (200 mg total) by mouth daily.  Dispense: 30 capsule; Refill: 12  4. Stress incontinence (female) (female) Change to Ditropan xl   General Counseling: Bernadine verbalizes understanding of the findings of todays visit and agrees with plan of treatment. I have discussed any further diagnostic evaluation that may be needed or ordered today. We also reviewed her medications today. she has been encouraged to call the office with any questions or concerns that should arise related to todays visit.    Time spent:25 Minutes   Dr Lavera Guise Internal medicine

## 2017-07-20 ENCOUNTER — Other Ambulatory Visit: Payer: Self-pay

## 2017-07-20 MED ORDER — OMEPRAZOLE 40 MG PO CPDR: 40.0000 mg | DELAYED_RELEASE_CAPSULE | Freq: Every day | ORAL | 5 refills | Status: DC

## 2017-07-26 ENCOUNTER — Other Ambulatory Visit: Payer: Self-pay

## 2017-07-26 MED ORDER — OXYBUTYNIN CHLORIDE ER 10 MG PO TB24: 10.0000 mg | ORAL_TABLET | Freq: Every day | ORAL | 1 refills | Status: DC

## 2017-07-30 ENCOUNTER — Other Ambulatory Visit: Payer: Self-pay

## 2017-07-30 MED ORDER — ATORVASTATIN CALCIUM 10 MG PO TABS: ORAL_TABLET | ORAL | 1 refills | Status: DC

## 2017-08-15 DIAGNOSIS — H25813 Combined forms of age-related cataract, bilateral: Secondary | ICD-10-CM | POA: Diagnosis not present

## 2017-08-22 ENCOUNTER — Ambulatory Visit (INDEPENDENT_AMBULATORY_CARE_PROVIDER_SITE_OTHER): Payer: Medicare Other

## 2017-08-22 DIAGNOSIS — G4733 Obstructive sleep apnea (adult) (pediatric): Secondary | ICD-10-CM

## 2017-08-22 NOTE — Progress Notes (Signed)
95 percentile pressure 9   95th percentile leak 0.7   apnea index 0 /hr  apnea-hypopnea index  0.5 /hr   total days used  >4 hr 166 days  total days used <4 hr 14 days  Total compliance 87.8 percent  Patient doing great no problems or questions at this time

## 2017-08-27 ENCOUNTER — Other Ambulatory Visit: Payer: Self-pay

## 2017-08-27 MED ORDER — ATORVASTATIN CALCIUM 10 MG PO TABS: ORAL_TABLET | ORAL | 1 refills | Status: DC

## 2017-09-03 DIAGNOSIS — Z1231 Encounter for screening mammogram for malignant neoplasm of breast: Secondary | ICD-10-CM | POA: Diagnosis not present

## 2017-09-04 ENCOUNTER — Encounter: Payer: Self-pay | Admitting: General Surgery

## 2017-09-06 ENCOUNTER — Encounter: Payer: Self-pay | Admitting: General Surgery

## 2017-09-06 ENCOUNTER — Ambulatory Visit (INDEPENDENT_AMBULATORY_CARE_PROVIDER_SITE_OTHER): Payer: Medicare Other | Admitting: General Surgery

## 2017-09-06 VITALS — BP 138/72 | HR 78 | Resp 16 | Ht 66.0 in | Wt 200.0 lb

## 2017-09-06 DIAGNOSIS — N6019 Diffuse cystic mastopathy of unspecified breast: Secondary | ICD-10-CM | POA: Diagnosis not present

## 2017-09-06 NOTE — Progress Notes (Signed)
Patient ID: Cathy Curtis, female   DOB: 30-Oct-1940, 77 y.o.   MRN: [id number]  Chief Complaint  Patient presents with  . Follow-up    HPI Cathy Curtis is a 77 y.o. female who presents for a breast evaluation. The most recent mammogram was done on 08/30/2017.Marland Kitchen  Patient does perform regular self breast checks and gets regular mammograms done.    HPI  Past Medical History:  Diagnosis Date  . Atrial fibrillation ([place]) 2016  . Atrial flutter ([place]) 2009  . Breast screening, unspecified   . Diffuse cystic mastopathy    FCD  . Family history of malignant neoplasm of gastrointestinal tract   . Hemorrhoids 2009   resolved  . Obesity, unspecified   . Osteoarthritis   . Osteoporosis   . Personal history of tobacco use, presenting hazards to health   . Screening for obesity   . Sleep apnea    admits to c-pap  . Special screening for malignant neoplasms, colon   . Unspecified essential hypertension 2002    Past Surgical History:  Procedure Laterality Date  . ABDOMINAL HYSTERECTOMY    . CARDIOVERSION  2009, 2010  . CHOLECYSTECTOMY    . COLONOSCOPY  2010,02/2014   Dr. Jamal Collin, [facility]  . HEMORRHOID SURGERY  09-06-2007   stapled  . PACEMAKER INSERTION Left 09/2011  . SALPINGOOPHORECTOMY  1974    Family History  Problem Relation Age of Onset  . Colon cancer Father   . Colon cancer Sister   . Colon cancer Sister     Social History Social History   Tobacco Use  . Smoking status: Former Smoker    Packs/day: 1.00    Years: 20.00    Pack years: 20.00    Types: Cigarettes    Last attempt to quit: 05/16/1991    Years since quitting: 26.3  . Smokeless tobacco: Never Used  Substance Use Topics  . Alcohol use: Yes    Comment: occasionally  . Drug use: No    Allergies  Allergen Reactions  . Prevacid [Lansoprazole] Other (See Comments)    whelps    Current Outpatient Medications  Medication Sig Dispense Refill  . acetaminophen (TYLENOL) 500 MG tablet Take 500 mg by mouth  every 6 (six) hours as needed.    Marland Kitchen amLODipine (NORVASC) 10 MG tablet Take 1 tablet (10 mg total) by mouth daily. 90 tablet 3  . apixaban (ELIQUIS) 5 MG TABS tablet Take 5 mg by mouth 2 (two) times daily.    Marland Kitchen atorvastatin (LIPITOR) 10 MG tablet 1 tablet once daily. 90 tablet 1  . Calcium Citrate-Vitamin D 200-250 MG-UNIT TABS Take 600 mg by mouth.     . celecoxib (CELEBREX) 200 MG capsule Take 1 capsule (200 mg total) by mouth daily. (Patient taking differently: Take 200 mg by mouth as needed. ) 30 capsule 12  . Cholecalciferol (VITAMIN D-3) 1000 UNITS CAPS Take by mouth daily.    . Coenzyme Q10 (CO Q-10) 100 MG CAPS Take by mouth daily.    . diclofenac sodium (VOLTAREN) 1 % GEL Apply 4 g topically 2 (two) times daily. 4 Tube 12  . metoprolol tartrate (LOPRESSOR) 25 MG tablet Take 25 mg by mouth 2 (two) times daily.    Marland Kitchen omeprazole (PRILOSEC) 40 MG capsule Take 1 capsule (40 mg total) by mouth daily. 30 capsule 5  . oxybutynin (DITROPAN-XL) 10 MG 24 hr tablet Take 1 tablet (10 mg total) by mouth daily. 90 tablet 1  . zolpidem (AMBIEN  CR) 12.5 MG CR tablet Take 1 tablet (12.5 mg total) by mouth at bedtime as needed for sleep. 30 tablet 2   No current facility-administered medications for this visit.     Review of Systems Review of Systems  Constitutional: Negative.   Respiratory: Negative.   Cardiovascular: Negative.     Blood pressure 138/72, pulse 78, resp. rate 16, height 5\' 6"  (1.676 m), weight 200 lb (90.7 kg).  Physical Exam Physical Exam  Constitutional: She is oriented to person, place, and time. She appears well-developed and well-nourished.  Eyes: Conjunctivae are normal. No scleral icterus.  Neck: Neck supple.  Cardiovascular: Normal rate, regular rhythm and normal heart sounds.  Pulmonary/Chest: Effort normal and breath sounds normal. Right breast exhibits no inverted nipple, no mass, no nipple discharge, no skin change and no tenderness. Left breast exhibits no inverted  nipple, no mass, no nipple discharge, no skin change and no tenderness.    Lymphadenopathy:    She has no cervical adenopathy.    She has no axillary adenopathy.  Neurological: She is alert and oriented to person, place, and time.  Skin: Skin is warm and dry.    Data Reviewed August 30, 2017 bilateral mammograms reviewed.  No interval change, BI-RADS-2.  Assessment    No interval change in her breast exam.    Plan  Patient is due for her colonoscopy next year. Patient will be asked to return to the office in one year with a bilateral screening mammogram at [facility].  The patient is aware to call back for any questions or concerns.   HPI, Physical Exam, Assessment and Plan have been scribed under the direction and in the presence of Hervey Ard, MD.  Gaspar Cola, CMA  I have completed the exam and reviewed the above documentation for accuracy and completeness.  I agree with the above.  Haematologist has been used and any errors in dictation or transcription are unintentional.  Hervey Ard, M.D., F.A.C.S.  Cathy Curtis 09/06/2017, 9:40 PM

## 2017-09-23 ENCOUNTER — Emergency Department
Admission: EM | Admit: 2017-09-23 | Discharge: 2017-09-23 | Disposition: A | Discharge status: Home / Self Care | Payer: Medicare Other | Attending: Emergency Medicine | Admitting: Emergency Medicine

## 2017-09-23 ENCOUNTER — Other Ambulatory Visit: Payer: Self-pay

## 2017-09-23 ENCOUNTER — Encounter: Payer: Self-pay | Admitting: Emergency Medicine

## 2017-09-23 DIAGNOSIS — R21 Rash and other nonspecific skin eruption: Secondary | ICD-10-CM | POA: Diagnosis not present

## 2017-09-23 DIAGNOSIS — Y998 Other external cause status: Secondary | ICD-10-CM | POA: Insufficient documentation

## 2017-09-23 DIAGNOSIS — Z87891 Personal history of nicotine dependence: Secondary | ICD-10-CM | POA: Insufficient documentation

## 2017-09-23 DIAGNOSIS — Z79899 Other long term (current) drug therapy: Secondary | ICD-10-CM | POA: Insufficient documentation

## 2017-09-23 DIAGNOSIS — S80862A Insect bite (nonvenomous), left lower leg, initial encounter: Secondary | ICD-10-CM | POA: Insufficient documentation

## 2017-09-23 DIAGNOSIS — L299 Pruritus, unspecified: Secondary | ICD-10-CM | POA: Diagnosis not present

## 2017-09-23 DIAGNOSIS — W57XXXA Bitten or stung by nonvenomous insect and other nonvenomous arthropods, initial encounter: Secondary | ICD-10-CM | POA: Insufficient documentation

## 2017-09-23 DIAGNOSIS — Y9389 Activity, other specified: Secondary | ICD-10-CM | POA: Diagnosis not present

## 2017-09-23 DIAGNOSIS — Y929 Unspecified place or not applicable: Secondary | ICD-10-CM | POA: Insufficient documentation

## 2017-09-23 DIAGNOSIS — Z950 Presence of cardiac pacemaker: Secondary | ICD-10-CM | POA: Diagnosis not present

## 2017-09-23 DIAGNOSIS — Z7901 Long term (current) use of anticoagulants: Secondary | ICD-10-CM | POA: Diagnosis not present

## 2017-09-23 MED ORDER — DOXYCYCLINE HYCLATE 100 MG PO TABS: 100.0000 mg | ORAL_TABLET | Freq: Two times a day (BID) | ORAL | 0 refills | Status: DC

## 2017-09-23 NOTE — ED Provider Notes (Signed)
[facility] Emergency Department Provider Note  ____________________________________________  Time seen: Approximately 5:55 PM  I have reviewed the triage vital signs and the nursing notes.   HISTORY  Chief Complaint Tick Removal    HPI Cathy Curtis is a 77 y.o. female who presents the emergency department with a complaint of tick bite to the left posterior lower leg.  Patient reports that she removed a small tick to the lower aspect of her lower leg.  Patient is unsure how long tick was then placed, she reports that it was a small solid black tick.  Patient reports that she has had a red rash to the area with itching.  She denies any systemic complaints of fevers or chills, body aches, myalgias or arthralgias, nausea or vomiting.  Patient was concerned as erythema has been spreading after removal.  No other complaints associated with tick bite.  Patient does have a history of atrial fibrillation, atrial flutter, GERD, hypertension, sleep apnea.  No complaints with chronic medical problems.  Past Medical History:  Diagnosis Date  . Atrial fibrillation ([place]) 2016  . Atrial flutter ([place]) 2009  . Breast screening, unspecified   . Diffuse cystic mastopathy    FCD  . Family history of malignant neoplasm of gastrointestinal tract   . Hemorrhoids 2009   resolved  . Obesity, unspecified   . Osteoarthritis   . Osteoporosis   . Personal history of tobacco use, presenting hazards to health   . Screening for obesity   . Sleep apnea    admits to c-pap  . Special screening for malignant neoplasms, colon   . Unspecified essential hypertension 2002    Patient Active Problem List   Diagnosis Date Noted  . Paroxysmal atrial fibrillation ([place]) 07/04/2017  . GERD (gastroesophageal reflux disease) 05/01/2017  . Sick sinus syndrome ([place]) 05/01/2017  . Other fatigue 05/01/2017  . Moderate mitral insufficiency 11/02/2016  . Benign essential hypertension 03/01/2015  .  Cardiac pacemaker 03/01/2015  . Hyperlipemia, mixed 03/06/2014  . Obstructive sleep apnea 03/06/2014  . Diffuse cystic mastopathy 08/06/2012  . Family history of malignant neoplasm of gastrointestinal tract 08/06/2012    Past Surgical History:  Procedure Laterality Date  . ABDOMINAL HYSTERECTOMY    . CARDIOVERSION  2009, 2010  . CHOLECYSTECTOMY    . COLONOSCOPY  2010,02/2014   Dr. Jamal Collin, [facility]  . HEMORRHOID SURGERY  09-06-2007   stapled  . PACEMAKER INSERTION Left 09/2011  . SALPINGOOPHORECTOMY  1974    Prior to Admission medications   Medication Sig Start Date End Date Taking? Authorizing Provider  acetaminophen (TYLENOL) 500 MG tablet Take 500 mg by mouth every 6 (six) hours as needed.    [provider]  amLODipine (NORVASC) 10 MG tablet Take 1 tablet (10 mg total) by mouth daily. 05/03/17   Lavera Guise, MD  apixaban (ELIQUIS) 5 MG TABS tablet Take 5 mg by mouth 2 (two) times daily.    [provider]  atorvastatin (LIPITOR) 10 MG tablet 1 tablet once daily. 08/27/17   Ronnell Freshwater, NP  Calcium Citrate-Vitamin D 200-250 MG-UNIT TABS Take 600 mg by mouth.     [provider]  celecoxib (CELEBREX) 200 MG capsule Take 1 capsule (200 mg total) by mouth daily. Patient taking differently: Take 200 mg by mouth as needed.  07/04/17   Lavera Guise, MD  Cholecalciferol (VITAMIN D-3) 1000 UNITS CAPS Take by mouth daily.    [provider]  Coenzyme Q10 (CO  Q-10) 100 MG CAPS Take by mouth daily.    [provider]  diclofenac sodium (VOLTAREN) 1 % GEL Apply 4 g topically 2 (two) times daily. 07/04/17   Lavera Guise, MD  doxycycline (VIBRA-TABS) 100 MG tablet Take 1 tablet (100 mg total) by mouth 2 (two) times daily. 09/23/17   Cuthriell, Charline Bills, PA-C  metoprolol tartrate (LOPRESSOR) 25 MG tablet Take 25 mg by mouth 2 (two) times daily.    [provider]  omeprazole (PRILOSEC) 40 MG capsule Take 1 capsule (40 mg total) by mouth  daily. 07/20/17   Lavera Guise, MD  oxybutynin (DITROPAN-XL) 10 MG 24 hr tablet Take 1 tablet (10 mg total) by mouth daily. 07/26/17   Ronnell Freshwater, NP  zolpidem (AMBIEN CR) 12.5 MG CR tablet Take 1 tablet (12.5 mg total) by mouth at bedtime as needed for sleep. 06/13/17   Ronnell Freshwater, NP    Allergies Prevacid [lansoprazole]  Family History  Problem Relation Age of Onset  . Colon cancer Father   . Colon cancer Sister   . Colon cancer Sister     Social History Social History   Tobacco Use  . Smoking status: Former Smoker    Packs/day: 1.00    Years: 20.00    Pack years: 20.00    Types: Cigarettes    Last attempt to quit: 05/16/1991    Years since quitting: 26.3  . Smokeless tobacco: Never Used  Substance Use Topics  . Alcohol use: Yes    Comment: occasionally  . Drug use: No     Review of Systems  Constitutional: No fever/chills Eyes: No visual changes.  ENT: No upper respiratory complaints. Cardiovascular: no chest pain. Respiratory: no cough. No SOB. Gastrointestinal: No abdominal pain.  No nausea, no vomiting.  No diarrhea.  No constipation. Genitourinary: Negative for dysuria. No hematuria Musculoskeletal: Negative for musculoskeletal pain. Skin: Positive for tick bite that has been removed, with increasing rash to site. Neurological: Negative for headaches, focal weakness or numbness. 10-point ROS otherwise negative.  ____________________________________________   PHYSICAL EXAM:  VITAL SIGNS: ED Triage Vitals  Enc Vitals Group     BP 09/23/17 1744 (!) 158/89     Pulse Rate 09/23/17 1744 82     Resp 09/23/17 1744 (!) 6     Temp 09/23/17 1744 98.5 F (36.9 C)     Temp Source 09/23/17 1744 Oral     SpO2 09/23/17 1744 95 %     Weight 09/23/17 1742 197 lb (89.4 kg)     Height 09/23/17 1742 5\' 6"  (1.676 m)     Head Circumference --      Peak Flow --      Pain Score 09/23/17 1742 0     Pain Loc --      Pain Edu? --      Excl. in [place]? --       Constitutional: Alert and oriented. Well appearing and in no acute distress. Eyes: Conjunctivae are normal. PERRL. EOMI. Head: Atraumatic. Neck: No stridor.  Neck is supple full range of motion Hematological/Lymphatic/Immunilogical: No cervical lymphadenopathy. Cardiovascular: Normal rate, regular rhythm. Normal S1 and S2.  Good peripheral circulation. Respiratory: Normal respiratory effort without tachypnea or retractions. Lungs CTAB. Good air entry to the bases with no decreased or absent breath sounds. Musculoskeletal: Full range of motion to all extremities. No gross deformities appreciated. Neurologic:  Normal speech and language. No gross focal neurologic deficits are appreciated.  Skin:  Skin  is warm, dry and intact. No rash noted.  Visualization of the posterior lower leg reveals a central excoriation consistent with tick bite with surrounding erythema and edema.  This is linear in nature, measuring approximately 5 cm in width, 1 cm and height.  No findings consistent with cellulitis.  No fluctuance or induration.  Area is nontender to palpation.  Nonblanching. Psychiatric: Mood and affect are normal. Speech and behavior are normal. Patient exhibits appropriate insight and judgement.   ____________________________________________   LABS (all labs ordered are listed, but only abnormal results are displayed)  Labs Reviewed - No data to display ____________________________________________  EKG   ____________________________________________  RADIOLOGY   No results found.  ____________________________________________    PROCEDURES  Procedure(s) performed:    Procedures    Medications - No data to display   ____________________________________________   INITIAL IMPRESSION / ASSESSMENT AND PLAN / ED COURSE  Pertinent labs & imaging results that were available during my care of the patient were reviewed by me and considered in my medical decision making  (see chart for details).  Review of the [place] CSRS was performed in accordance of the [place] prior to dispensing any controlled drugs.     Patient's diagnosis is consistent with tick bite.  Patient presents emergency department after removing a tick from the left lower extremity.  Patient does have erythematous lesions surrounding tick bite location.  This does not have a bull's-eye look or appearance of [facility] spotted fever.  No myalgias or arthralgias, fevers or chills, nausea or vomiting.  Given atypical presentation, patient will be treated prophylactically with doxycycline. Patient will be discharged home with prescriptions for doxycycline. Patient is to follow up with primary care as needed or otherwise directed. Patient is given ED precautions to return to the ED for any worsening or new symptoms.     ____________________________________________  FINAL CLINICAL IMPRESSION(S) / ED DIAGNOSES  Final diagnoses:  Tick bite, initial encounter      NEW MEDICATIONS STARTED DURING THIS VISIT:  ED Discharge Orders        Ordered    doxycycline (VIBRA-TABS) 100 MG tablet  2 times daily     09/23/17 1849          This chart was dictated using voice recognition software/Dragon. Despite best efforts to proofread, errors can occur which can change the meaning. Any change was purely unintentional.    Darletta Moll, PA-C 09/23/17 1850    Nance Pear, MD 09/23/17 2017

## 2017-09-23 NOTE — ED Triage Notes (Signed)
Pt states tick bite on Friday of last week, area inflamed at this time.

## 2017-10-12 ENCOUNTER — Telehealth: Payer: Self-pay

## 2017-10-12 ENCOUNTER — Other Ambulatory Visit: Payer: Self-pay | Admitting: Nurse Practitioner

## 2017-10-12 DIAGNOSIS — F5101 Primary insomnia: Secondary | ICD-10-CM

## 2017-10-12 MED ORDER — ZOLPIDEM TARTRATE 10 MG PO TABS: 10.0000 mg | ORAL_TABLET | Freq: Every evening | ORAL | 2 refills | Status: DC | PRN

## 2017-10-12 NOTE — Progress Notes (Signed)
D/c ambien CR as insurance will no longer cover. Changed to zolpidem 10mg  QHS PRN insomnia. New prescription sent to [facility].

## 2017-10-12 NOTE — Telephone Encounter (Signed)
D/c ambien CR as insurance will no longer cover. Changed to zolpidem 10mg  QHS PRN insomnia. New prescription sent to [facility].

## 2017-10-31 DIAGNOSIS — I495 Sick sinus syndrome: Secondary | ICD-10-CM | POA: Diagnosis not present

## 2017-10-31 DIAGNOSIS — Z950 Presence of cardiac pacemaker: Secondary | ICD-10-CM | POA: Diagnosis not present

## 2017-10-31 DIAGNOSIS — I10 Essential (primary) hypertension: Secondary | ICD-10-CM | POA: Diagnosis not present

## 2017-10-31 DIAGNOSIS — I480 Paroxysmal atrial fibrillation: Secondary | ICD-10-CM | POA: Diagnosis not present

## 2017-10-31 DIAGNOSIS — R600 Localized edema: Secondary | ICD-10-CM | POA: Diagnosis not present

## 2017-11-28 DIAGNOSIS — I480 Paroxysmal atrial fibrillation: Secondary | ICD-10-CM | POA: Diagnosis not present

## 2017-11-28 DIAGNOSIS — I495 Sick sinus syndrome: Secondary | ICD-10-CM | POA: Diagnosis not present

## 2017-11-28 DIAGNOSIS — E782 Mixed hyperlipidemia: Secondary | ICD-10-CM | POA: Diagnosis not present

## 2017-11-28 DIAGNOSIS — I10 Essential (primary) hypertension: Secondary | ICD-10-CM | POA: Diagnosis not present

## 2018-01-01 ENCOUNTER — Encounter: Payer: Self-pay | Admitting: Internal Medicine

## 2018-01-01 ENCOUNTER — Ambulatory Visit (INDEPENDENT_AMBULATORY_CARE_PROVIDER_SITE_OTHER): Payer: Medicare Other | Admitting: Internal Medicine

## 2018-01-01 VITALS — BP 164/82 | HR 60 | Resp 16 | Ht 66.0 in | Wt 198.0 lb

## 2018-01-01 DIAGNOSIS — E782 Mixed hyperlipidemia: Secondary | ICD-10-CM | POA: Diagnosis not present

## 2018-01-01 DIAGNOSIS — F320 Major depressive disorder, single episode, mild: Secondary | ICD-10-CM

## 2018-01-01 DIAGNOSIS — I10 Essential (primary) hypertension: Secondary | ICD-10-CM

## 2018-01-01 DIAGNOSIS — N393 Stress incontinence (female) (male): Secondary | ICD-10-CM | POA: Diagnosis not present

## 2018-01-01 NOTE — Progress Notes (Signed)
[facility] [place], [place] [address]  Internal MEDICINE  Office Visit Note  Patient Name: Cathy Curtis  [id number]  [id number]  Date of Service: 01/04/2018  Chief Complaint  Patient presents with  . Fatigue    6 month follow up  . Hypertension  . Depression  . Urinary Incontinence    Hypertension  This is a chronic problem. The current episode started more than 1 year ago. The problem has been rapidly improving since onset. The problem is controlled. Associated symptoms include malaise/fatigue. Pertinent negatives include no chest pain, headaches, neck pain, palpitations or shortness of breath. (BLE due to norvasc and it was stopped by her cardiologist, she was started on Valsartan, bp is slightly elevated ) Risk factors for coronary artery disease include dyslipidemia and obesity. There are no compliance problems.   Depression         This is a chronic problem.  The current episode started 1 to 4 weeks ago.   The onset quality is gradual.   The problem occurs rarely.  Associated symptoms include no fatigue and no headaches.  Past treatments include nothing.  Compliance with treatment is good (pt does not want to be treated ). Other  This is a chronic (worsening stress incontinence) problem. The problem has been unchanged. Pertinent negatives include no abdominal pain, arthralgias, chest pain, chills, coughing, diaphoresis, fatigue, headaches, nausea, neck pain or vomiting. Treatments tried: Pt will like to increase her ditropan.   Current Medication: Outpatient Encounter Medications as of 01/01/2018  Medication Sig  . acetaminophen (TYLENOL) 500 MG tablet Take 500 mg by mouth every 6 (six) hours as needed.  Marland Kitchen apixaban (ELIQUIS) 5 MG TABS tablet Take 5 mg by mouth 2 (two) times daily.  Marland Kitchen atorvastatin (LIPITOR) 10 MG tablet 1 tablet once daily.  . Calcium Citrate-Vitamin D 200-250 MG-UNIT TABS Take 600 mg by mouth.   . Cholecalciferol (VITAMIN D-3) 1000  UNITS CAPS Take by mouth daily.  . Coenzyme Q10 (CO Q-10) 100 MG CAPS Take by mouth daily.  . diclofenac sodium (VOLTAREN) 1 % GEL Apply 4 g topically 2 (two) times daily.  . metoprolol tartrate (LOPRESSOR) 25 MG tablet Take 25 mg by mouth 2 (two) times daily.  Marland Kitchen omeprazole (PRILOSEC) 40 MG capsule Take 1 capsule (40 mg total) by mouth daily.  . valsartan (DIOVAN) 160 MG tablet Take 160 mg by mouth daily.  . [DISCONTINUED] oxybutynin (DITROPAN-XL) 10 MG 24 hr tablet Take 1 tablet (10 mg total) by mouth daily.  . celecoxib (CELEBREX) 200 MG capsule Take 1 capsule (200 mg total) by mouth daily. (Patient taking differently: Take 200 mg by mouth as needed. )  . doxycycline (VIBRA-TABS) 100 MG tablet Take 1 tablet (100 mg total) by mouth 2 (two) times daily. (Patient not taking: Reported on 01/01/2018)  . oxybutynin (DITROPAN XL) 15 MG 24 hr tablet Take 1 tablet (15 mg total) by mouth at bedtime.  Marland Kitchen zolpidem (AMBIEN) 10 MG tablet Take 1 tablet (10 mg total) by mouth at bedtime as needed for sleep.  . [DISCONTINUED] amLODipine (NORVASC) 10 MG tablet Take 1 tablet (10 mg total) by mouth daily. (Patient not taking: Reported on 01/01/2018)   No facility-administered encounter medications on file as of 01/01/2018.     Surgical History: Past Surgical History:  Procedure Laterality Date  . ABDOMINAL HYSTERECTOMY    . CARDIOVERSION  2009, 2010  . CHOLECYSTECTOMY    . COLONOSCOPY  2010,02/2014   Dr. Jamal Collin,  [place] [facility]  09-06-2007   stapled  . PACEMAKER INSERTION Left 09/2011  . SALPINGOOPHORECTOMY  1974    Medical History: Past Medical History:  Diagnosis Date  . Atrial fibrillation ([place]) 2016  . Atrial flutter ([place]) 2009  . Breast screening, unspecified   . Diffuse cystic mastopathy    FCD  . Family history of malignant neoplasm of gastrointestinal tract   . Hemorrhoids 2009   resolved  . Obesity, unspecified   . Osteoarthritis   . Osteoporosis   . Personal history of  tobacco use, presenting hazards to health   . Screening for obesity   . Sleep apnea    admits to c-pap  . Special screening for malignant neoplasms, colon   . Unspecified essential hypertension 2002    Family History: Family History  Problem Relation Age of Onset  . Colon cancer Father   . Colon cancer Sister   . Colon cancer Sister     Social History   Socioeconomic History  . Marital status: Married    Spouse name: Not on file  . Number of children: Not on file  . Years of education: Not on file  . Highest education level: Not on file  Occupational History  . Not on file  Social Needs  . Financial resource strain: Not on file  . Food insecurity:    Worry: Not on file    Inability: Not on file  . Transportation needs:    Medical: Not on file    Non-medical: Not on file  Tobacco Use  . Smoking status: Former Smoker    Packs/day: 1.00    Years: 20.00    Pack years: 20.00    Types: Cigarettes    Last attempt to quit: 05/16/1991    Years since quitting: 26.6  . Smokeless tobacco: Never Used  Substance and Sexual Activity  . Alcohol use: Yes    Comment: occasionally  . Drug use: No  . Sexual activity: Not on file  Lifestyle  . Physical activity:    Days per week: Not on file    Minutes per session: Not on file  . Stress: Not on file  Relationships  . Social connections:    Talks on phone: Not on file    Gets together: Not on file    Attends religious service: Not on file    Active member of club or organization: Not on file    Attends meetings of clubs or organizations: Not on file    Relationship status: Not on file  . Intimate partner violence:    Fear of current or ex partner: Not on file    Emotionally abused: Not on file    Physically abused: Not on file    Forced sexual activity: Not on file  Other Topics Concern  . Not on file  Social History Narrative  . Not on file   Review of Systems  Constitutional: Positive for malaise/fatigue. Negative  for chills, diaphoresis and fatigue.  HENT: Negative for ear pain, postnasal drip and sinus pressure.   Eyes: Negative for photophobia, discharge, redness, itching and visual disturbance.  Respiratory: Negative for cough, shortness of breath and wheezing.   Cardiovascular: Negative for chest pain, palpitations and leg swelling.  Gastrointestinal: Negative for abdominal pain, constipation, diarrhea, nausea and vomiting.  Genitourinary: Negative for dysuria and flank pain.  Musculoskeletal: Negative for arthralgias, back pain, gait problem and neck pain.  Skin: Negative for color change.  Allergic/Immunologic: Negative for  environmental allergies and food allergies.  Neurological: Negative for dizziness and headaches.  Hematological: Does not bruise/bleed easily.  Psychiatric/Behavioral: Positive for depression. Negative for agitation, behavioral problems (depression) and hallucinations.   tal Signs: BP (!) 164/82 (BP Location: Left Arm, Patient Position: Sitting, Cuff Size: Large)   Pulse 60   Resp 16   Ht 5\' 6"  (1.676 m)   Wt 198 lb (89.8 kg)   SpO2 95%   BMI 31.96 kg/m    Physical Exam  Constitutional: She is oriented to person, place, and time. She appears well-developed and well-nourished. No distress.  HENT:  Head: Normocephalic and atraumatic.  Mouth/Throat: Oropharynx is clear and moist. No oropharyngeal exudate.  Eyes: Pupils are equal, round, and reactive to light. EOM are normal.  Neck: Normal range of motion. Neck supple. No JVD present. No tracheal deviation present. No thyromegaly present.  Cardiovascular: Normal rate, regular rhythm and normal heart sounds. Exam reveals no gallop and no friction rub.  No murmur heard. Pulmonary/Chest: Effort normal. No respiratory distress. She has no wheezes. She has no rales. She exhibits no tenderness.  Abdominal: Soft. Bowel sounds are normal.  Musculoskeletal: Normal range of motion.  Lymphadenopathy:    She has no cervical  adenopathy.  Neurological: She is alert and oriented to person, place, and time. No cranial nerve deficit.  Skin: Skin is warm and dry. She is not diaphoretic.  Psychiatric: She has a normal mood and affect. Her behavior is normal. Judgment and thought content normal.   Assessment/Plan: 1. Benign essential hypertension Continue metoprolol and  valsartan (DIOVAN) 160 MG tablet; Take 160 mg by mouth daily.  2. Depression, major, single episode, mild ([place]) She does not want therapy at this time, husband in the room and he agrees   3. Hyperlipemia, mixed Controlled   4. Stress incontinence (female) (female) Increase  oxybutynin (DITROPAN XL) 15 MG 24 hr tablet; Take 1 tablet (15 mg total) by mouth at bedtime.  General Counseling: azalynn maxim understanding of the findings of todays visit and agrees with plan of treatment. I have discussed any further diagnostic evaluation that may be needed or ordered today. We also reviewed her medications today. she has been encouraged to call the office with any questions or concerns that should arise related to todays visit.   Meds ordered this encounter  Medications  . oxybutynin (DITROPAN XL) 15 MG 24 hr tablet    Sig: Take 1 tablet (15 mg total) by mouth at bedtime.    Dispense:  90 tablet    Refill:  3    Time spent:25  Minutes   Dr Lavera Guise Internal medicine

## 2018-01-02 ENCOUNTER — Ambulatory Visit: Payer: Self-pay | Admitting: Internal Medicine

## 2018-01-04 MED ORDER — OXYBUTYNIN CHLORIDE ER 15 MG PO TB24: 15.0000 mg | ORAL_TABLET | Freq: Every day | ORAL | 3 refills | Status: DC

## 2018-01-21 ENCOUNTER — Other Ambulatory Visit: Payer: Self-pay

## 2018-01-21 MED ORDER — OMEPRAZOLE 40 MG PO CPDR: 40.0000 mg | DELAYED_RELEASE_CAPSULE | Freq: Every day | ORAL | 5 refills | Status: DC

## 2018-02-04 DIAGNOSIS — Z23 Encounter for immunization: Secondary | ICD-10-CM | POA: Diagnosis not present

## 2018-02-11 ENCOUNTER — Other Ambulatory Visit: Payer: Self-pay | Admitting: Internal Medicine

## 2018-02-11 MED ORDER — ATORVASTATIN CALCIUM 10 MG PO TABS: ORAL_TABLET | ORAL | 1 refills | Status: DC

## 2018-02-19 ENCOUNTER — Ambulatory Visit (INDEPENDENT_AMBULATORY_CARE_PROVIDER_SITE_OTHER): Payer: Medicare Other | Admitting: Adult Health

## 2018-02-19 ENCOUNTER — Encounter: Payer: Self-pay | Admitting: Adult Health

## 2018-02-19 VITALS — BP 136/80 | HR 69 | Resp 16 | Ht 66.0 in | Wt 198.0 lb

## 2018-02-19 DIAGNOSIS — J0110 Acute frontal sinusitis, unspecified: Secondary | ICD-10-CM

## 2018-02-19 DIAGNOSIS — G4733 Obstructive sleep apnea (adult) (pediatric): Secondary | ICD-10-CM

## 2018-02-19 DIAGNOSIS — I10 Essential (primary) hypertension: Secondary | ICD-10-CM | POA: Diagnosis not present

## 2018-02-19 DIAGNOSIS — Z7901 Long term (current) use of anticoagulants: Secondary | ICD-10-CM

## 2018-02-19 DIAGNOSIS — Z950 Presence of cardiac pacemaker: Secondary | ICD-10-CM

## 2018-02-19 DIAGNOSIS — Z9989 Dependence on other enabling machines and devices: Secondary | ICD-10-CM

## 2018-02-19 MED ORDER — AMOXICILLIN-POT CLAVULANATE 875-125 MG PO TABS: 1.0000 | ORAL_TABLET | Freq: Two times a day (BID) | ORAL | 0 refills | Status: DC

## 2018-02-19 NOTE — Progress Notes (Signed)
[facility] [place], [place] [address]  Pulmonary Sleep Medicine   Office Visit Note  Patient Name: Cathy Curtis DOB: 11-Jun-1940 MRN [id number]  Date of Service: 02/19/2018  Complaints/HPI: Pt here for follow up for OSA.  She has been using her CPAP for over 8 years. Generally she reports she uses it every night.  She has had a head cold for the last couple days and can not wear the CPAP right now.  She reports cleaning her machine as indicated. 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  She denies chest pain, sob.  She reports her sinus head cold has been present for a couple weeks.  Denies fever/chills.    ROS  General: (-) fever, (-) chills, (-) night sweats, (-) weakness Skin: (-) rashes, (-) itching,. Eyes: (-) visual changes, (-) redness, (-) itching. Nose and Sinuses: (-) nasal stuffiness or itchiness, (-) postnasal drip, (-) nosebleeds, (-) sinus trouble. Mouth and Throat: (-) sore throat, (-) hoarseness. Neck: (-) swollen glands, (-) enlarged thyroid, (-) neck pain. Respiratory: - cough, (-) bloody sputum, - shortness of breath, - wheezing. Cardiovascular: - ankle swelling, (-) chest pain. Lymphatic: (-) lymph node enlargement. Neurologic: (-) numbness, (-) tingling. Psychiatric: (-) anxiety, (-) depression   Current Medication: Outpatient Encounter Medications as of 02/19/2018  Medication Sig  . acetaminophen (TYLENOL) 500 MG tablet Take 500 mg by mouth every 6 (six) hours as needed.  Marland Kitchen apixaban (ELIQUIS) 5 MG TABS tablet Take 5 mg by mouth 2 (two) times daily.  Marland Kitchen atorvastatin (LIPITOR) 10 MG tablet 1 tablet once daily.  . Calcium Citrate-Vitamin D 200-250 MG-UNIT TABS Take 600 mg by mouth.   . celecoxib (CELEBREX) 200 MG capsule Take 1 capsule (200 mg total) by mouth daily. (Patient taking differently: Take 200 mg by mouth as needed. )  . Cholecalciferol (VITAMIN D-3) 1000 UNITS CAPS Take by mouth daily.  .  Coenzyme Q10 (CO Q-10) 100 MG CAPS Take by mouth daily.  . metoprolol tartrate (LOPRESSOR) 25 MG tablet Take 25 mg by mouth 2 (two) times daily.  Marland Kitchen omeprazole (PRILOSEC) 40 MG capsule Take 1 capsule (40 mg total) by mouth daily.  Marland Kitchen oxybutynin (DITROPAN XL) 15 MG 24 hr tablet Take 1 tablet (15 mg total) by mouth at bedtime.  . valsartan (DIOVAN) 160 MG tablet Take 160 mg by mouth daily.  Marland Kitchen amoxicillin-clavulanate (AUGMENTIN) 875-125 MG tablet Take 1 tablet by mouth 2 (two) times daily.  . diclofenac sodium (VOLTAREN) 1 % GEL Apply 4 g topically 2 (two) times daily. (Patient not taking: Reported on 02/19/2018)  . zolpidem (AMBIEN) 10 MG tablet Take 1 tablet (10 mg total) by mouth at bedtime as needed for sleep.  . [DISCONTINUED] doxycycline (VIBRA-TABS) 100 MG tablet Take 1 tablet (100 mg total) by mouth 2 (two) times daily. (Patient not taking: Reported on 01/01/2018)   No facility-administered encounter medications on file as of 02/19/2018.     Surgical History: Past Surgical History:  Procedure Laterality Date  . ABDOMINAL HYSTERECTOMY    . CARDIOVERSION  2009, 2010  . CHOLECYSTECTOMY    . COLONOSCOPY  2010,02/2014   Dr. Jamal Collin, [facility]  . HEMORRHOID SURGERY  09-06-2007   stapled  . PACEMAKER INSERTION Left 09/2011  . SALPINGOOPHORECTOMY  1974    Medical History: Past Medical History:  Diagnosis Date  . Atrial fibrillation ([place]) 2016  . Atrial flutter ([place]) 2009  . Breast screening, unspecified   .  Diffuse cystic mastopathy    FCD  . Family history of malignant neoplasm of gastrointestinal tract   . Hemorrhoids 2009   resolved  . Obesity, unspecified   . Osteoarthritis   . Osteoporosis   . Personal history of tobacco use, presenting hazards to health   . Screening for obesity   . Sleep apnea    admits to c-pap  . Special screening for malignant neoplasms, colon   . Unspecified essential hypertension 2002    Family History: Family History  Problem Relation Age of Onset  .  Colon cancer Father   . Colon cancer Sister   . Colon cancer Sister     Social History: Social History   Socioeconomic History  . Marital status: Married    Spouse name: Not on file  . Number of children: Not on file  . Years of education: Not on file  . Highest education level: Not on file  Occupational History  . Not on file  Social Needs  . Financial resource strain: Not on file  . Food insecurity:    Worry: Not on file    Inability: Not on file  . Transportation needs:    Medical: Not on file    Non-medical: Not on file  Tobacco Use  . Smoking status: Former Smoker    Packs/day: 1.00    Years: 20.00    Pack years: 20.00    Types: Cigarettes    Last attempt to quit: 05/16/1991    Years since quitting: 26.7  . Smokeless tobacco: Never Used  Substance and Sexual Activity  . Alcohol use: Yes    Comment: occasionally  . Drug use: No  . Sexual activity: Not on file  Lifestyle  . Physical activity:    Days per week: Not on file    Minutes per session: Not on file  . Stress: Not on file  Relationships  . Social connections:    Talks on phone: Not on file    Gets together: Not on file    Attends religious service: Not on file    Active member of club or organization: Not on file    Attends meetings of clubs or organizations: Not on file    Relationship status: Not on file  . Intimate partner violence:    Fear of current or ex partner: Not on file    Emotionally abused: Not on file    Physically abused: Not on file    Forced sexual activity: Not on file  Other Topics Concern  . Not on file  Social History Narrative  . Not on file    Vital Signs: Blood pressure 136/80, pulse 69, resp. rate 16, height 5\' 6"  (1.676 m), weight 198 lb (89.8 kg), SpO2 98 %.  Examination: General Appearance: The patient is well-developed, well-nourished, and in no distress. Skin: Gross inspection of skin unremarkable. Head: normocephalic, no gross deformities. Eyes: no gross  deformities noted. ENT: ears appear grossly normal no exudates. Neck: Supple. No thyromegaly. No LAD. Respiratory: Clear to auscultation bilaterly. Cardiovascular: Normal S1 and S2 without murmur or rub. Extremities: No cyanosis. pulses are equal. Neurologic: Alert and oriented. No involuntary movements.  LABS: No results found for this or any previous visit (from the past 2160 hour(s)).  Radiology: No results found.  No results found.  No results found.    Assessment and Plan: Patient Active Problem List   Diagnosis Date Noted  . Paroxysmal atrial fibrillation ([place]) 07/04/2017  . GERD (gastroesophageal reflux  disease) 05/01/2017  . Sick sinus syndrome ([place]) 05/01/2017  . Other fatigue 05/01/2017  . Moderate mitral insufficiency 11/02/2016  . Benign essential hypertension 03/01/2015  . Cardiac pacemaker 03/01/2015  . Hyperlipemia, mixed 03/06/2014  . Obstructive sleep apnea 03/06/2014  . Diffuse cystic mastopathy 08/06/2012  . Family history of malignant neoplasm of gastrointestinal tract 08/06/2012   1. OSA on CPAP Continue current therapy.  Encouraged compliance with CPAP.  Discussed end organ damage, and need for use.    2. Acute non-recurrent frontal sinusitis Take Augmentin as directed.  - amoxicillin-clavulanate (AUGMENTIN) 875-125 MG tablet; Take 1 tablet by mouth 2 (two) times daily.  Dispense: 14 tablet; Refill: 0  3. Benign essential hypertension Stable, continue current therapy.   4. Cardiac pacemaker Followed by Dr. Nehemiah Massed.   5. Chronic anticoagulation On Eliquis, continue current management.    General Counseling: I have discussed the findings of the evaluation and examination with Tomi Bamberger.  I have also discussed any further diagnostic evaluation thatmay be needed or ordered today. Noemie verbalizes understanding of the findings of todays visit. We also reviewed her medications today and discussed drug interactions and side effects including but not  limited excessive drowsiness and altered mental states. We also discussed that there is always a risk not just to her but also people around her. she has been encouraged to call the office with any questions or concerns that should arise related to todays visit.    Time spent: 25 This patient was seen by Orson Gear AGNP-C in Collaboration with Dr. Devona Konig as a part of collaborative care agreement.   I have personally obtained a history, examined the patient, evaluated laboratory and imaging results, formulated the assessment and plan and placed orders.    Allyne Gee, MD [facility] Pulmonary and Critical Care Sleep medicine

## 2018-02-19 NOTE — Patient Instructions (Signed)

## 2018-03-06 ENCOUNTER — Ambulatory Visit (INDEPENDENT_AMBULATORY_CARE_PROVIDER_SITE_OTHER): Payer: Medicare Other

## 2018-03-06 DIAGNOSIS — G4733 Obstructive sleep apnea (adult) (pediatric): Secondary | ICD-10-CM | POA: Diagnosis not present

## 2018-03-06 NOTE — Progress Notes (Signed)
95 percentile pressure 9   95th percentile leak 0   apnea index   apnea-hypopnea index  1.0 /hr   total days used  >4 hr 28 days  total days used <4 hr 2 days  Total compliance 86.7 percent  Cathy Curtis is doing great no problems or questions

## 2018-03-27 ENCOUNTER — Other Ambulatory Visit: Payer: Self-pay | Admitting: Nurse Practitioner

## 2018-03-27 DIAGNOSIS — F5101 Primary insomnia: Secondary | ICD-10-CM

## 2018-03-27 MED ORDER — ZOLPIDEM TARTRATE 10 MG PO TABS: 10.0000 mg | ORAL_TABLET | Freq: Every evening | ORAL | 0 refills | Status: DC | PRN

## 2018-03-27 NOTE — Progress Notes (Signed)
Approved #30 zolpidem 10mg  with no refills until her next visit.

## 2018-04-03 ENCOUNTER — Other Ambulatory Visit: Payer: Self-pay | Admitting: Internal Medicine

## 2018-04-03 DIAGNOSIS — I10 Essential (primary) hypertension: Secondary | ICD-10-CM | POA: Diagnosis not present

## 2018-04-03 DIAGNOSIS — E782 Mixed hyperlipidemia: Secondary | ICD-10-CM | POA: Diagnosis not present

## 2018-04-03 DIAGNOSIS — Z0001 Encounter for general adult medical examination with abnormal findings: Secondary | ICD-10-CM | POA: Diagnosis not present

## 2018-04-03 DIAGNOSIS — E559 Vitamin D deficiency, unspecified: Secondary | ICD-10-CM | POA: Diagnosis not present

## 2018-04-04 LAB — COMPREHENSIVE METABOLIC PANEL
ALK PHOS: 57 IU/L (ref 39–117)
ALT: 13 IU/L (ref 0–32)
AST: 19 IU/L (ref 0–40)
Albumin/Globulin Ratio: 1.5 (ref 1.2–2.2)
Albumin: 4.3 g/dL (ref 3.5–4.8)
BILIRUBIN TOTAL: 0.4 mg/dL (ref 0.0–1.2)
BUN/Creatinine Ratio: 13 (ref 12–28)
BUN: 11 mg/dL (ref 8–27)
CALCIUM: 9.8 mg/dL (ref 8.7–10.3)
CHLORIDE: 104 mmol/L (ref 96–106)
CO2: 24 mmol/L (ref 20–29)
Creatinine, Ser: 0.84 mg/dL (ref 0.57–1.00)
GFR calc non Af Amer: 68 mL/min/{1.73_m2} (ref >59)
GFR, EST AFRICAN AMERICAN: 79 mL/min/{1.73_m2} (ref >59)
GLUCOSE: 98 mg/dL (ref 65–99)
Globulin, Total: 2.9 g/dL (ref 1.5–4.5)
POTASSIUM: 3.8 mmol/L (ref 3.5–5.2)
Sodium: 144 mmol/L (ref 134–144)
Total Protein: 7.2 g/dL (ref 6.0–8.5)

## 2018-04-04 LAB — LIPID PANEL W/O CHOL/HDL RATIO
Cholesterol, Total: 144 mg/dL (ref 100–199)
HDL: 51 mg/dL (ref >39)
LDL CALC: 63 mg/dL (ref 0–99)
TRIGLYCERIDES: 152 mg/dL — AB (ref 0–149)
VLDL Cholesterol Cal: 30 mg/dL (ref 5–40)

## 2018-04-04 LAB — CBC
HEMOGLOBIN: 12.2 g/dL (ref 11.1–15.9)
Hematocrit: 37.6 % (ref 34.0–46.6)
MCH: 29.5 pg (ref 26.6–33.0)
MCHC: 32.4 g/dL (ref 31.5–35.7)
MCV: 91 fL (ref 79–97)
Platelets: 190 10*3/uL (ref 150–450)
RBC: 4.13 x10E6/uL (ref 3.77–5.28)
RDW: 13.1 % (ref 12.3–15.4)
WBC: 6.4 10*3/uL (ref 3.4–10.8)

## 2018-04-04 LAB — TSH: TSH: 0.871 u[IU]/mL (ref 0.450–4.500)

## 2018-04-04 LAB — T4, FREE: Free T4: 1.18 ng/dL (ref 0.82–1.77)

## 2018-04-04 LAB — VITAMIN D 25 HYDROXY (VIT D DEFICIENCY, FRACTURES): Vit D, 25-Hydroxy: 48.2 ng/mL (ref 30.0–100.0)

## 2018-04-16 DIAGNOSIS — I10 Essential (primary) hypertension: Secondary | ICD-10-CM | POA: Diagnosis not present

## 2018-04-16 DIAGNOSIS — I480 Paroxysmal atrial fibrillation: Secondary | ICD-10-CM | POA: Diagnosis not present

## 2018-04-16 DIAGNOSIS — I340 Nonrheumatic mitral (valve) insufficiency: Secondary | ICD-10-CM | POA: Diagnosis not present

## 2018-04-16 DIAGNOSIS — I495 Sick sinus syndrome: Secondary | ICD-10-CM | POA: Diagnosis not present

## 2018-04-16 DIAGNOSIS — G4733 Obstructive sleep apnea (adult) (pediatric): Secondary | ICD-10-CM | POA: Diagnosis not present

## 2018-04-22 ENCOUNTER — Telehealth: Payer: Self-pay | Admitting: Internal Medicine

## 2018-04-22 NOTE — Telephone Encounter (Signed)
-----   Message from Lavera Guise, MD sent at 04/22/2018  9:57 AM EST ----- Improved labs, please let her know

## 2018-04-22 NOTE — Telephone Encounter (Signed)
Pt informed of labs

## 2018-05-03 ENCOUNTER — Encounter: Payer: Self-pay | Admitting: Nurse Practitioner

## 2018-05-03 ENCOUNTER — Ambulatory Visit (INDEPENDENT_AMBULATORY_CARE_PROVIDER_SITE_OTHER): Payer: Medicare Other | Admitting: Nurse Practitioner

## 2018-05-03 VITALS — BP 170/82 | HR 75 | Resp 16 | Ht 66.0 in | Wt 195.8 lb

## 2018-05-03 DIAGNOSIS — N393 Stress incontinence (female) (male): Secondary | ICD-10-CM

## 2018-05-03 DIAGNOSIS — Z0001 Encounter for general adult medical examination with abnormal findings: Secondary | ICD-10-CM

## 2018-05-03 DIAGNOSIS — R300 Dysuria: Secondary | ICD-10-CM | POA: Diagnosis not present

## 2018-05-03 DIAGNOSIS — F5101 Primary insomnia: Secondary | ICD-10-CM

## 2018-05-03 DIAGNOSIS — Z950 Presence of cardiac pacemaker: Secondary | ICD-10-CM | POA: Diagnosis not present

## 2018-05-03 DIAGNOSIS — I10 Essential (primary) hypertension: Secondary | ICD-10-CM

## 2018-05-03 MED ORDER — ZOLPIDEM TARTRATE 10 MG PO TABS: 10.0000 mg | ORAL_TABLET | Freq: Every evening | ORAL | 3 refills | Status: DC | PRN

## 2018-05-03 MED ORDER — HYDROCHLOROTHIAZIDE 12.5 MG PO TABS: 12.5000 mg | ORAL_TABLET | Freq: Every day | ORAL | 3 refills | Status: DC

## 2018-05-03 MED ORDER — VALSARTAN 320 MG PO TABS: 320.0000 mg | ORAL_TABLET | Freq: Every day | ORAL | 11 refills | Status: DC

## 2018-05-03 NOTE — Progress Notes (Signed)
[facility] [place], [place] [address]  Internal MEDICINE  Office Visit Note  Patient Name: Cathy Curtis  [id number]  [id number]  Date of Service: 05/05/2018   Pt is here for routine health maintenance examination  Chief Complaint  Patient presents with  . Medicare Wellness    well visit     The patient is here for health maintenance exam. Blood pressure is elevated today. The patient's blood pressure is moderately elevated. She recently saw her cardiologist and blood pressure was even higher. Valsartan was increased to 320mg  daily. There is some improvement since increase, but still not to acceptable range. She denies chest pain, pressure, or shortness of breath. She denies headache.     Current Medication: Outpatient Encounter Medications as of 05/03/2018  Medication Sig Note  . acetaminophen (TYLENOL) 500 MG tablet Take 500 mg by mouth every 6 (six) hours as needed.   Marland Kitchen apixaban (ELIQUIS) 5 MG TABS tablet Take 5 mg by mouth 2 (two) times daily.   Marland Kitchen atorvastatin (LIPITOR) 10 MG tablet 1 tablet once daily.   . Calcium Citrate-Vitamin D 200-250 MG-UNIT TABS Take 600 mg by mouth.    . celecoxib (CELEBREX) 200 MG capsule Take 1 capsule (200 mg total) by mouth daily. (Patient taking differently: Take 200 mg by mouth as needed. )   . Cholecalciferol (VITAMIN D-3) 1000 UNITS CAPS Take by mouth daily.   . Coenzyme Q10 (CO Q-10) 100 MG CAPS Take by mouth daily.   . metoprolol tartrate (LOPRESSOR) 25 MG tablet Take 25 mg by mouth 2 (two) times daily.   Marland Kitchen omeprazole (PRILOSEC) 40 MG capsule Take 1 capsule (40 mg total) by mouth daily.   Marland Kitchen oxybutynin (DITROPAN XL) 15 MG 24 hr tablet Take 1 tablet (15 mg total) by mouth at bedtime.   . valsartan (DIOVAN) 320 MG tablet Take 1 tablet (320 mg total) by mouth daily.   . [DISCONTINUED] valsartan (DIOVAN) 320 MG tablet Take 1 tablet by mouth daily. 05/03/2018: per cardiology  . amoxicillin-clavulanate (AUGMENTIN)  875-125 MG tablet Take 1 tablet by mouth 2 (two) times daily. (Patient not taking: Reported on 05/03/2018)   . diclofenac sodium (VOLTAREN) 1 % GEL Apply 4 g topically 2 (two) times daily. (Patient not taking: Reported on 02/19/2018)   . hydrochlorothiazide (HYDRODIURIL) 12.5 MG tablet Take 1 tablet (12.5 mg total) by mouth daily.   Marland Kitchen zolpidem (AMBIEN) 10 MG tablet Take 1 tablet (10 mg total) by mouth at bedtime as needed for sleep.   . [DISCONTINUED] valsartan (DIOVAN) 160 MG tablet Take 160 mg by mouth daily. 05/03/2018: per cardiology  . [DISCONTINUED] zolpidem (AMBIEN) 10 MG tablet Take 1 tablet (10 mg total) by mouth at bedtime as needed for sleep.    No facility-administered encounter medications on file as of 05/03/2018.     Surgical History: Past Surgical History:  Procedure Laterality Date  . ABDOMINAL HYSTERECTOMY    . CARDIOVERSION  2009, 2010  . CHOLECYSTECTOMY    . COLONOSCOPY  2010,02/2014   Dr. Jamal Collin, [facility]  . HEMORRHOID SURGERY  09-06-2007   stapled  . PACEMAKER INSERTION Left 09/2011  . SALPINGOOPHORECTOMY  1974    Medical History: Past Medical History:  Diagnosis Date  . Atrial fibrillation ([place]) 2016  . Atrial flutter ([place]) 2009  . Breast screening, unspecified   . Diffuse cystic mastopathy    FCD  . Family history of malignant neoplasm of gastrointestinal tract   . Hemorrhoids 2009  resolved  . Obesity, unspecified   . Osteoarthritis   . Osteoporosis   . Personal history of tobacco use, presenting hazards to health   . Screening for obesity   . Sleep apnea    admits to c-pap  . Special screening for malignant neoplasms, colon   . Unspecified essential hypertension 2002    Family History: Family History  Problem Relation Age of Onset  . Colon cancer Father   . Colon cancer Sister   . Colon cancer Sister       Review of Systems  Constitutional: Negative for activity change, chills, diaphoresis and fatigue.  HENT: Negative for ear pain,  postnasal drip and sinus pressure.   Eyes: Negative for photophobia, discharge, redness, itching and visual disturbance.  Respiratory: Negative for cough, shortness of breath and wheezing.   Cardiovascular: Negative for chest pain, palpitations and leg swelling.       Elevated blood pressure today.  Gastrointestinal: Negative for abdominal pain, constipation, diarrhea, nausea and vomiting.  Endocrine: Negative for cold intolerance, heat intolerance, polydipsia and polyuria.  Musculoskeletal: Negative for arthralgias, back pain, gait problem and neck pain.  Skin: Negative for color change and rash.  Allergic/Immunologic: Negative for environmental allergies and food allergies.  Neurological: Negative for dizziness and headaches.  Hematological: Negative for adenopathy. Does not bruise/bleed easily.  Psychiatric/Behavioral: Positive for sleep disturbance. Negative for agitation, behavioral problems (depression) and hallucinations.     Today's Vitals   05/03/18 1133  BP: (!) 170/82  Pulse: 75  Resp: 16  SpO2: 95%  Weight: 195 lb 12.8 oz (88.8 kg)  Height: 5\' 6"  (1.676 m)    Physical Exam Vitals signs and nursing note reviewed.  Constitutional:      General: She is not in acute distress.    Appearance: Normal appearance. She is well-developed. She is not diaphoretic.  HENT:     Head: Normocephalic and atraumatic.     Nose: Nose normal.     Mouth/Throat:     Mouth: Mucous membranes are moist.     Pharynx: Oropharynx is clear. No oropharyngeal exudate.  Eyes:     Extraocular Movements: Extraocular movements intact.     Pupils: Pupils are equal, round, and reactive to light.  Neck:     Musculoskeletal: Normal range of motion and neck supple.     Thyroid: No thyromegaly.     Vascular: No carotid bruit or JVD.     Trachea: No tracheal deviation.  Cardiovascular:     Rate and Rhythm: Normal rate and regular rhythm.     Pulses: Normal pulses.     Heart sounds: Normal heart  sounds. No murmur. No friction rub. No gallop.   Pulmonary:     Effort: Pulmonary effort is normal. No respiratory distress.     Breath sounds: Normal breath sounds. No wheezing or rales.  Chest:     Chest wall: No tenderness.  Abdominal:     General: Bowel sounds are normal.     Palpations: Abdomen is soft.  Musculoskeletal: Normal range of motion.  Lymphadenopathy:     Cervical: No cervical adenopathy.  Skin:    General: Skin is warm and dry.     Capillary Refill: Capillary refill takes less than 2 seconds.  Neurological:     General: No focal deficit present.     Mental Status: She is alert and oriented to person, place, and time.     Cranial Nerves: No cranial nerve deficit.  Psychiatric:  Behavior: Behavior normal.        Thought Content: Thought content normal.        Judgment: Judgment normal.    Depression screen [facility] 2/9 01/01/2018 07/04/2017 05/01/2017  Decreased Interest 0 0 0  Down, Depressed, Hopeless 0 0 0  PHQ - 2 Score 0 0 0    Functional Status Survey: Is the patient deaf or have difficulty hearing?: No Does the patient have difficulty seeing, even when wearing glasses/contacts?: No Does the patient have difficulty concentrating, remembering, or making decisions?: No Does the patient have difficulty walking or climbing stairs?: No Does the patient have difficulty dressing or bathing?: No Does the patient have difficulty doing errands alone such as visiting a doctor's office or shopping?: No  MMSE - [place] Exam 05/03/2018  Orientation to time 5  Orientation to Place 5  Registration 3  Attention/ Calculation 5  Recall 3  Language- name 2 objects 2  Language- repeat 1  Language- follow 3 step command 3  Language- read & follow direction 1  Write a sentence 1  Copy design 1  Total score 30    Fall Risk  05/03/2018 01/01/2018 07/04/2017 05/01/2017 01/10/2016  Falls in the past year? 0 No No No No  Comment - - - - [place] Telephone Survey:  data to providers prior to load      LABS: Recent Results (from the past 2160 hour(s))  Comprehensive metabolic panel     Status: None   Collection Time: 04/03/18 11:22 AM  Result Value Ref Range   Glucose 98 65 - 99 mg/dL   BUN 11 8 - 27 mg/dL   Creatinine, Ser 0.84 0.57 - 1.00 mg/dL   GFR calc non Af Amer 68 >59 mL/min/1.73   GFR calc Af Amer 79 >59 mL/min/1.73   BUN/Creatinine Ratio 13 12 - 28   Sodium 144 134 - 144 mmol/L   Potassium 3.8 3.5 - 5.2 mmol/L   Chloride 104 96 - 106 mmol/L   CO2 24 20 - 29 mmol/L   Calcium 9.8 8.7 - 10.3 mg/dL   Total Protein 7.2 6.0 - 8.5 g/dL   Albumin 4.3 3.5 - 4.8 g/dL   Globulin, Total 2.9 1.5 - 4.5 g/dL   Albumin/Globulin Ratio 1.5 1.2 - 2.2   Bilirubin Total 0.4 0.0 - 1.2 mg/dL   Alkaline Phosphatase 57 39 - 117 IU/L   AST 19 0 - 40 IU/L   ALT 13 0 - 32 IU/L  CBC     Status: None   Collection Time: 04/03/18 11:22 AM  Result Value Ref Range   WBC 6.4 3.4 - 10.8 x10E3/uL   RBC 4.13 3.77 - 5.28 x10E6/uL   Hemoglobin 12.2 11.1 - 15.9 g/dL   Hematocrit 37.6 34.0 - 46.6 %   MCV 91 79 - 97 fL   MCH 29.5 26.6 - 33.0 pg   MCHC 32.4 31.5 - 35.7 g/dL   RDW 13.1 12.3 - 15.4 %   Platelets 190 150 - 450 x10E3/uL  Lipid Panel w/o Chol/HDL Ratio     Status: Abnormal   Collection Time: 04/03/18 11:22 AM  Result Value Ref Range   Cholesterol, Total 144 100 - 199 mg/dL   Triglycerides 152 (H) 0 - 149 mg/dL   HDL 51 >39 mg/dL   VLDL Cholesterol Cal 30 5 - 40 mg/dL   LDL Calculated 63 0 - 99 mg/dL  T4, free     Status: None   Collection Time: 04/03/18  11:22 AM  Result Value Ref Range   Free T4 1.18 0.82 - 1.77 ng/dL  TSH     Status: None   Collection Time: 04/03/18 11:22 AM  Result Value Ref Range   TSH 0.871 0.450 - 4.500 uIU/mL  VITAMIN D 25 Hydroxy (Vit-D Deficiency, Fractures)     Status: None   Collection Time: 04/03/18 11:22 AM  Result Value Ref Range   Vit D, 25-Hydroxy 48.2 30.0 - 100.0 ng/mL    Comment: Vitamin D deficiency has  been defined by the [place] practice guideline as a level of serum 25-OH vitamin D less than 20 ng/mL (1,2). The Endocrine Society went on to further define vitamin D insufficiency as a level between 21 and 29 ng/mL (2). 1. IOM (Institute of Medicine). 2010. Dietary reference    intakes for calcium and D. [place]: The    [employer]. 2. Holick MF, Binkley , Bischoff-Ferrari HA, et al.    Evaluation, treatment, and prevention of vitamin D    deficiency: an Endocrine Society clinical practice    guideline. JCEM. 2011 Jul; 96(7):1911-30.   UA/M w/rflx Culture, Routine     Status: Abnormal (Preliminary result)   Collection Time: 05/03/18 11:03 AM  Result Value Ref Range   Specific Gravity, UA 1.022 1.005 - 1.030   pH, UA 5.0 5.0 - 7.5   Color, UA Yellow Yellow   Appearance Ur Cloudy (A) Clear   Leukocytes, UA 1+ (A) Negative   Protein, UA Negative Negative/Trace   Glucose, UA Negative Negative   Ketones, UA Negative Negative   RBC, UA Trace (A) Negative   Bilirubin, UA Negative Negative   Urobilinogen, Ur 0.2 0.2 - 1.0 mg/dL   Nitrite, UA Positive (A) Negative   Microscopic Examination See below:     Comment: Microscopic was indicated and was performed.   Urinalysis Reflex Comment     Comment: This specimen has reflexed to a Urine Culture.  Microscopic Examination     Status: Abnormal   Collection Time: 05/03/18 11:03 AM  Result Value Ref Range   WBC, UA 6-10 (A) 0 - 5 /hpf   RBC, UA 0-2 0 - 2 /hpf   Epithelial Cells (non renal) 0-10 0 - 10 /hpf   Casts None seen None seen /lpf   Mucus, UA Present Not Estab.   Bacteria, UA Few None seen/Few  Urine Culture, Reflex     Status: Abnormal (Preliminary result)   Collection Time: 05/03/18 11:03 AM  Result Value Ref Range   Urine Culture, Routine Preliminary report (A)    Organism ID, Bacteria Escherichia coli (A)     Comment: Greater than 100,000 colony forming units per  mL   ORGANISM ID, BACTERIA Comment     Comment: Microbiological testing to rule out the presence of possible pathogens is in progress.    Assessment/Plan: 1. Encounter for general adult medical examination with abnormal findings Annual health maintenance exam today.  2. Essential hypertension Add HCTZ 12.5mg  tablets daily. Continue valsartan and metoprolol as prescribed. Recommend she discuss addition to cardiology at her next visit.  - hydrochlorothiazide (HYDRODIURIL) 12.5 MG tablet; Take 1 tablet (12.5 mg total) by mouth daily.  Dispense: 30 tablet; Refill: 3  3. Cardiac pacemaker Continue regular visits with cardiology as scheduled.   4. Primary insomnia Continue ambien 10mg  at bedtime as needed. New prescription provided today. - zolpidem (AMBIEN) 10 MG tablet; Take 1 tablet (10 mg total) by mouth  at bedtime as needed for sleep.  Dispense: 30 tablet; Refill: 3  5. Stress incontinence (female) (female) Continue oxybutynin as prescribed. Adjust as indicated.   6. Dysuria - UA/M w/rflx Culture, Routine    General Counseling: Larua verbalizes understanding of the findings of todays visit and agrees with plan of treatment. I have discussed any further diagnostic evaluation that may be needed or ordered today. We also reviewed her medications today. she has been encouraged to call the office with any questions or concerns that should arise related to todays visit.    Counseling:  Hypertension Counseling:   The following hypertensive lifestyle modification were recommended and discussed:  1. Limiting alcohol intake to less than 1 oz/day of ethanol:(24 oz of beer or 8 oz of wine or 2 oz of 100-proof whiskey). 2. Take baby ASA 81 mg daily. 3. Importance of regular aerobic exercise and losing weight. 4. Reduce dietary saturated fat and cholesterol intake for overall cardiovascular health. 5. Maintaining adequate dietary potassium, calcium, and magnesium intake. 6. Regular  monitoring of the blood pressure. 7. Reduce sodium intake to less than 100 mmol/day (less than 2.3 gm of sodium or less than 6 gm of sodium choride)   This patient was seen by [place] with Dr Lavera Guise as a part of collaborative care agreement  Orders Placed This Encounter  Procedures  . Microscopic Examination  . Urine Culture, Reflex  . UA/M w/rflx Culture, Routine    Meds ordered this encounter  Medications  . valsartan (DIOVAN) 320 MG tablet    Sig: Take 1 tablet (320 mg total) by mouth daily.    Dispense:  30 tablet    Refill:  11    Dose increased per cardiology    Order Specific Question:   Supervising Provider    Answer:   Lavera Guise [[id number]]  . zolpidem (AMBIEN) 10 MG tablet    Sig: Take 1 tablet (10 mg total) by mouth at bedtime as needed for sleep.    Dispense:  30 tablet    Refill:  3    Order Specific Question:   Supervising Provider    Answer:   Lavera Guise [[id number]]  . hydrochlorothiazide (HYDRODIURIL) 12.5 MG tablet    Sig: Take 1 tablet (12.5 mg total) by mouth daily.    Dispense:  30 tablet    Refill:  3    Order Specific Question:   Supervising Provider    Answer:   Lavera Guise [[id number]]    Time spent: [place], MD  Internal Medicine

## 2018-05-05 DIAGNOSIS — F5101 Primary insomnia: Secondary | ICD-10-CM | POA: Insufficient documentation

## 2018-05-05 DIAGNOSIS — Z0001 Encounter for general adult medical examination with abnormal findings: Secondary | ICD-10-CM | POA: Insufficient documentation

## 2018-05-05 DIAGNOSIS — N393 Stress incontinence (female) (male): Secondary | ICD-10-CM | POA: Insufficient documentation

## 2018-05-06 ENCOUNTER — Telehealth: Payer: Self-pay | Admitting: Nurse Practitioner

## 2018-05-06 ENCOUNTER — Other Ambulatory Visit: Payer: Self-pay | Admitting: Nurse Practitioner

## 2018-05-06 DIAGNOSIS — N390 Urinary tract infection, site not specified: Secondary | ICD-10-CM

## 2018-05-06 LAB — URINE CULTURE, REFLEX

## 2018-05-06 LAB — UA/M W/RFLX CULTURE, ROUTINE
BILIRUBIN UA: NEGATIVE
GLUCOSE, UA: NEGATIVE
KETONES UA: NEGATIVE
NITRITE UA: POSITIVE — AB
Protein, UA: NEGATIVE
SPEC GRAV UA: 1.022 (ref 1.005–1.030)
UUROB: 0.2 mg/dL (ref 0.2–1.0)
pH, UA: 5 (ref 5.0–7.5)

## 2018-05-06 LAB — MICROSCOPIC EXAMINATION: Casts: NONE SEEN /lpf

## 2018-05-06 MED ORDER — SULFAMETHOXAZOLE-TRIMETHOPRIM 800-160 MG PO TABS: 1.0000 | ORAL_TABLET | Freq: Two times a day (BID) | ORAL | 0 refills | Status: DC

## 2018-05-06 NOTE — Telephone Encounter (Signed)
CALLED PT TO NOTIFY HER THAT HER URINE SHOWED INFECTION AND BACTRIM WAS SENT TO HER PHARMACY.

## 2018-05-06 NOTE — Progress Notes (Signed)
Urine sample from physical showed evidence of infection. Sent in prescription for bactrim DS bid for 7 days. Sent prescription to her pharmacy.

## 2018-05-06 NOTE — Telephone Encounter (Signed)
-----   Message from Ronnell Freshwater, NP sent at 05/06/2018  7:39 AM EST ----- Please let the patent know Urine sample from physical showed evidence of infection. Sent in prescription for bactrim DS bid for 7 days. Sent prescription to her pharmacy. thanks

## 2018-05-17 ENCOUNTER — Telehealth: Payer: Self-pay

## 2018-05-20 NOTE — Telephone Encounter (Signed)
PT ADVISED THAT STOPPED HCTZ  AND KEEP MONITOR BP AND FOLLOW UP 4 TO 6 WEEKS

## 2018-05-20 NOTE — Telephone Encounter (Signed)
This is ok. Blood pressures are doing better?

## 2018-05-20 NOTE — Telephone Encounter (Signed)
Ok. She should stop medication. Monitor blood pressure closely. Should see her back on 4 to 6 weeks. Thanks.

## 2018-06-17 ENCOUNTER — Ambulatory Visit (INDEPENDENT_AMBULATORY_CARE_PROVIDER_SITE_OTHER): Payer: Medicare Other | Admitting: Nurse Practitioner

## 2018-06-17 ENCOUNTER — Encounter: Payer: Self-pay | Admitting: Nurse Practitioner

## 2018-06-17 VITALS — BP 168/88 | HR 67 | Resp 16 | Ht 66.0 in | Wt 201.0 lb

## 2018-06-17 DIAGNOSIS — N390 Urinary tract infection, site not specified: Secondary | ICD-10-CM | POA: Diagnosis not present

## 2018-06-17 DIAGNOSIS — R300 Dysuria: Secondary | ICD-10-CM | POA: Diagnosis not present

## 2018-06-17 DIAGNOSIS — I10 Essential (primary) hypertension: Secondary | ICD-10-CM | POA: Diagnosis not present

## 2018-06-17 LAB — POCT URINALYSIS DIPSTICK
Bilirubin, UA: NEGATIVE
GLUCOSE UA: NEGATIVE
Ketones, UA: NEGATIVE
Nitrite, UA: NEGATIVE
PROTEIN UA: NEGATIVE
Spec Grav, UA: 1.01 (ref 1.010–1.025)
UROBILINOGEN UA: 0.2 U/dL
pH, UA: 5 (ref 5.0–8.0)

## 2018-06-17 MED ORDER — CEPHALEXIN 500 MG PO CAPS: 500.0000 mg | ORAL_CAPSULE | Freq: Three times a day (TID) | ORAL | 0 refills | Status: DC

## 2018-06-17 NOTE — Progress Notes (Signed)
[facility] [place], [place] [address]  Internal MEDICINE  Office Visit Note  Patient Name: Cathy Curtis  [id number]  [id number]  Date of Service: 06/23/2018   Pt is here for a sick visit.   Chief Complaint  Patient presents with  . Urinary Tract Infection    Pt had uti in december and doesnt believe that it cleared up, discolored urine, no pain or itchy, feeling spacey/ memory      Urinary Tract Infection   This is a new problem. The current episode started in the past 7 days. The problem occurs intermittently. The problem has been gradually worsening. The quality of the pain is described as burning and aching. The pain is at a severity of 3/10. The pain is mild. There has been no fever. There is no history of pyelonephritis. Associated symptoms include flank pain, frequency, nausea and urgency. Pertinent negatives include no chills or vomiting. She has tried acetaminophen for the symptoms. The treatment provided no relief.        Current Medication:  Outpatient Encounter Medications as of 06/17/2018  Medication Sig  . acetaminophen (TYLENOL) 500 MG tablet Take 500 mg by mouth every 6 (six) hours as needed.  Marland Kitchen apixaban (ELIQUIS) 5 MG TABS tablet Take 5 mg by mouth 2 (two) times daily.  Marland Kitchen atorvastatin (LIPITOR) 10 MG tablet 1 tablet once daily.  . Calcium Citrate-Vitamin D 200-250 MG-UNIT TABS Take 600 mg by mouth.   . celecoxib (CELEBREX) 200 MG capsule Take 1 capsule (200 mg total) by mouth daily. (Patient taking differently: Take 200 mg by mouth as needed. )  . Cholecalciferol (VITAMIN D-3) 1000 UNITS CAPS Take by mouth daily.  . Coenzyme Q10 (CO Q-10) 100 MG CAPS Take by mouth daily.  . diclofenac sodium (VOLTAREN) 1 % GEL Apply 4 g topically 2 (two) times daily.  . hydrochlorothiazide (HYDRODIURIL) 12.5 MG tablet Take 1 tablet (12.5 mg total) by mouth daily.  . metoprolol tartrate (LOPRESSOR) 25 MG tablet Take 25 mg by mouth 2 (two) times daily.   Marland Kitchen omeprazole (PRILOSEC) 40 MG capsule Take 1 capsule (40 mg total) by mouth daily.  Marland Kitchen oxybutynin (DITROPAN XL) 15 MG 24 hr tablet Take 1 tablet (15 mg total) by mouth at bedtime.  . valsartan (DIOVAN) 320 MG tablet Take 1 tablet (320 mg total) by mouth daily.  Marland Kitchen zolpidem (AMBIEN) 10 MG tablet Take 1 tablet (10 mg total) by mouth at bedtime as needed for sleep.  . cephALEXin (KEFLEX) 500 MG capsule Take 1 capsule (500 mg total) by mouth 3 (three) times daily.  . [DISCONTINUED] sulfamethoxazole-trimethoprim (BACTRIM DS,SEPTRA DS) 800-160 MG tablet Take 1 tablet by mouth 2 (two) times daily. (Patient not taking: Reported on 06/17/2018)   No facility-administered encounter medications on file as of 06/17/2018.       Medical History: Past Medical History:  Diagnosis Date  . Atrial fibrillation ([place]) 2016  . Atrial flutter ([place]) 2009  . Breast screening, unspecified   . Diffuse cystic mastopathy    FCD  . Family history of malignant neoplasm of gastrointestinal tract   . Hemorrhoids 2009   resolved  . Obesity, unspecified   . Osteoarthritis   . Osteoporosis   . Personal history of tobacco use, presenting hazards to health   . Screening for obesity   . Sleep apnea    admits to c-pap  . Special screening for malignant neoplasms, colon   . Unspecified essential hypertension 2002  Today's Vitals   06/17/18 1441  BP: (!) 168/88  Pulse: 67  Resp: 16  SpO2: 94%  Weight: 201 lb (91.2 kg)  Height: 5\' 6"  (1.676 m)   Body mass index is 32.44 kg/m.  Review of Systems  Constitutional: Positive for fatigue. Negative for chills and unexpected weight change.  HENT: Negative for congestion, postnasal drip, rhinorrhea, sneezing and sore throat.   Respiratory: Negative for cough, chest tightness, shortness of breath and wheezing.   Cardiovascular: Negative for chest pain and palpitations.  Gastrointestinal: Positive for nausea. Negative for abdominal pain, constipation, diarrhea and  vomiting.  Endocrine: Negative for cold intolerance, heat intolerance, polydipsia and polyuria.  Genitourinary: Positive for flank pain, frequency and urgency. Negative for dysuria.  Musculoskeletal: Negative for arthralgias, back pain, joint swelling and neck pain.  Skin: Negative for rash.  Neurological: Positive for dizziness, light-headedness and headaches. Negative for tremors and numbness.  Hematological: Negative for adenopathy. Does not bruise/bleed easily.  Psychiatric/Behavioral: Negative for behavioral problems (Depression), sleep disturbance and suicidal ideas. The patient is not nervous/anxious.     Physical Exam Constitutional:      General: She is not in acute distress.    Appearance: Normal appearance. She is well-developed. She is not diaphoretic.  HENT:     Head: Normocephalic and atraumatic.     Mouth/Throat:     Pharynx: No oropharyngeal exudate.  Eyes:     Conjunctiva/sclera: Conjunctivae normal.     Pupils: Pupils are equal, round, and reactive to light.  Neck:     Musculoskeletal: Normal range of motion and neck supple.     Thyroid: No thyromegaly.     Vascular: No JVD.     Trachea: No tracheal deviation.  Cardiovascular:     Rate and Rhythm: Normal rate and regular rhythm.     Heart sounds: Normal heart sounds. No murmur. No friction rub. No gallop.   Pulmonary:     Effort: Pulmonary effort is normal. No respiratory distress.     Breath sounds: Normal breath sounds. No wheezing or rales.  Chest:     Chest wall: No tenderness.  Abdominal:     General: Bowel sounds are normal.     Palpations: Abdomen is soft.     Tenderness: There is abdominal tenderness.  Genitourinary:    Comments: U/a positive for moderate WBC and trace blood.  Musculoskeletal: Normal range of motion.  Lymphadenopathy:     Cervical: No cervical adenopathy.  Skin:    General: Skin is warm and dry.  Neurological:     General: No focal deficit present.     Mental Status: She is  alert and oriented to person, place, and time. Mental status is at baseline.     Cranial Nerves: No cranial nerve deficit.  Psychiatric:        Behavior: Behavior normal.        Thought Content: Thought content normal.        Judgment: Judgment normal.   Assessment/Plan: 1. Urinary tract infection without hematuria, site unspecified Start keflex 500mg  three times daily for 7 days. Will send urine for culture and sensitivity and adjust antibiotics as indicated.  - cephALEXin (KEFLEX) 500 MG capsule; Take 1 capsule (500 mg total) by mouth 3 (three) times daily.  Dispense: 21 capsule; Refill: 0  2. Dysuria - POCT Urinalysis Dipstick - CULTURE, URINE COMPREHENSIVE  3. Essential hypertension Stable. Continue bp medication as prescribed .  General Counseling: taheerah guldin understanding of the findings of todays visit  and agrees with plan of treatment. I have discussed any further diagnostic evaluation that may be needed or ordered today. We also reviewed her medications today. she has been encouraged to call the office with any questions or concerns that should arise related to todays visit.    Counseling:  This patient was seen by Leretha Pol FNP Collaboration with Dr Lavera Guise as a part of collaborative care agreement  Orders Placed This Encounter  Procedures  . CULTURE, URINE COMPREHENSIVE  . POCT Urinalysis Dipstick    Meds ordered this encounter  Medications  . cephALEXin (KEFLEX) 500 MG capsule    Sig: Take 1 capsule (500 mg total) by mouth 3 (three) times daily.    Dispense:  21 capsule    Refill:  0    Order Specific Question:   Supervising Provider    Answer:   Lavera Guise [[id number]]    Time spent: 25 Minutes

## 2018-06-19 LAB — CULTURE, URINE COMPREHENSIVE

## 2018-06-23 DIAGNOSIS — R319 Hematuria, unspecified: Secondary | ICD-10-CM

## 2018-06-23 DIAGNOSIS — R300 Dysuria: Secondary | ICD-10-CM | POA: Insufficient documentation

## 2018-06-23 DIAGNOSIS — N390 Urinary tract infection, site not specified: Secondary | ICD-10-CM | POA: Insufficient documentation

## 2018-07-01 ENCOUNTER — Encounter: Payer: Self-pay | Admitting: Nurse Practitioner

## 2018-07-01 ENCOUNTER — Ambulatory Visit (INDEPENDENT_AMBULATORY_CARE_PROVIDER_SITE_OTHER): Payer: Medicare Other | Admitting: Nurse Practitioner

## 2018-07-01 VITALS — BP 152/88 | HR 81 | Resp 16 | Ht 66.0 in | Wt 194.4 lb

## 2018-07-01 DIAGNOSIS — N390 Urinary tract infection, site not specified: Secondary | ICD-10-CM | POA: Diagnosis not present

## 2018-07-01 DIAGNOSIS — I10 Essential (primary) hypertension: Secondary | ICD-10-CM | POA: Diagnosis not present

## 2018-07-01 DIAGNOSIS — K5909 Other constipation: Secondary | ICD-10-CM | POA: Diagnosis not present

## 2018-07-01 DIAGNOSIS — R300 Dysuria: Secondary | ICD-10-CM | POA: Diagnosis not present

## 2018-07-01 LAB — POCT URINALYSIS DIPSTICK
BILIRUBIN UA: NEGATIVE
Blood, UA: NEGATIVE
Glucose, UA: NEGATIVE
Ketones, UA: NEGATIVE
Nitrite, UA: NEGATIVE
PH UA: 6.5 (ref 5.0–8.0)
PROTEIN UA: NEGATIVE
Spec Grav, UA: 1.01 (ref 1.010–1.025)
UROBILINOGEN UA: 0.2 U/dL

## 2018-07-01 MED ORDER — NITROFURANTOIN MONOHYD MACRO 100 MG PO CAPS: 100.0000 mg | ORAL_CAPSULE | Freq: Two times a day (BID) | ORAL | 0 refills | Status: DC

## 2018-07-01 NOTE — Progress Notes (Signed)
[facility] [place], [place] [address]  Internal MEDICINE  Office Visit Note  Patient Name: Cathy Curtis  [id number]  [id number]  Date of Service: 07/01/2018  Chief Complaint  Patient presents with  . Urinary Tract Infection    2 wk follow up reapeat UA, pt have questions about UTI  . Hypertension    The patient was treated for uti at her last visit. Treated with 7 days of cephalexin 500mg  TID. Culture did show infection with normal urogenital flora. She feels some better but is still feeling out of sorts. Does have some chronic constipation. May be contributing to persistent urinary tract infection.  Blood pressure very elevated today. She did bring blood pressure log with her and blood pressures are generally running about 150s over 80s. Sometimes, the pressures are better. Today, pressure is much higher than normal. Did do trial of HCTZ 12.5mg  tablets, but this caused lo pressure and dehydration. She is scheduled to see her cardiologist next week.       Current Medication: Outpatient Encounter Medications as of 07/01/2018  Medication Sig  . acetaminophen (TYLENOL) 500 MG tablet Take 500 mg by mouth every 6 (six) hours as needed.  Marland Kitchen apixaban (ELIQUIS) 5 MG TABS tablet Take 5 mg by mouth 2 (two) times daily.  Marland Kitchen atorvastatin (LIPITOR) 10 MG tablet 1 tablet once daily.  . Calcium Citrate-Vitamin D 200-250 MG-UNIT TABS Take 600 mg by mouth.   . celecoxib (CELEBREX) 200 MG capsule Take 1 capsule (200 mg total) by mouth daily. (Patient taking differently: Take 200 mg by mouth as needed. )  . Cholecalciferol (VITAMIN D-3) 1000 UNITS CAPS Take by mouth daily.  . Coenzyme Q10 (CO Q-10) 100 MG CAPS Take by mouth daily.  . diclofenac sodium (VOLTAREN) 1 % GEL Apply 4 g topically 2 (two) times daily.  . hydrochlorothiazide (HYDRODIURIL) 12.5 MG tablet Take 1 tablet (12.5 mg total) by mouth daily.  . metoprolol tartrate (LOPRESSOR) 25 MG tablet Take 25 mg by mouth 2  (two) times daily.  Marland Kitchen omeprazole (PRILOSEC) 40 MG capsule Take 1 capsule (40 mg total) by mouth daily.  Marland Kitchen oxybutynin (DITROPAN XL) 15 MG 24 hr tablet Take 1 tablet (15 mg total) by mouth at bedtime.  . valsartan (DIOVAN) 320 MG tablet Take 1 tablet (320 mg total) by mouth daily.  Marland Kitchen zolpidem (AMBIEN) 10 MG tablet Take 1 tablet (10 mg total) by mouth at bedtime as needed for sleep.  . cephALEXin (KEFLEX) 500 MG capsule Take 1 capsule (500 mg total) by mouth 3 (three) times daily.  . nitrofurantoin, macrocrystal-monohydrate, (MACROBID) 100 MG capsule Take 1 capsule (100 mg total) by mouth 2 (two) times daily.   No facility-administered encounter medications on file as of 07/01/2018.     Surgical History: Past Surgical History:  Procedure Laterality Date  . ABDOMINAL HYSTERECTOMY    . CARDIOVERSION  2009, 2010  . CHOLECYSTECTOMY    . COLONOSCOPY  2010,02/2014   Dr. Jamal Collin, [facility]  . HEMORRHOID SURGERY  09-06-2007   stapled  . PACEMAKER INSERTION Left 09/2011  . SALPINGOOPHORECTOMY  1974    Medical History: Past Medical History:  Diagnosis Date  . Atrial fibrillation ([place]) 2016  . Atrial flutter ([place]) 2009  . Breast screening, unspecified   . Diffuse cystic mastopathy    FCD  . Family history of malignant neoplasm of gastrointestinal tract   . Hemorrhoids 2009   resolved  . Obesity, unspecified   . Osteoarthritis   .  Osteoporosis   . Personal history of tobacco use, presenting hazards to health   . Screening for obesity   . Sleep apnea    admits to c-pap  . Special screening for malignant neoplasms, colon   . Unspecified essential hypertension 2002    Family History: Family History  Problem Relation Age of Onset  . Colon cancer Father   . Colon cancer Sister   . Colon cancer Sister     Social History   Socioeconomic History  . Marital status: Married    Spouse name: Not on file  . Number of children: Not on file  . Years of education: Not on file  . Highest  education level: Not on file  Occupational History  . Not on file  Social Needs  . Financial resource strain: Not on file  . Food insecurity:    Worry: Not on file    Inability: Not on file  . Transportation needs:    Medical: Not on file    Non-medical: Not on file  Tobacco Use  . Smoking status: Former Smoker    Packs/day: 1.00    Years: 20.00    Pack years: 20.00    Types: Cigarettes    Last attempt to quit: 05/16/1991    Years since quitting: 27.1  . Smokeless tobacco: Never Used  Substance and Sexual Activity  . Alcohol use: Yes    Comment: rarely  . Drug use: No  . Sexual activity: Not on file  Lifestyle  . Physical activity:    Days per week: Not on file    Minutes per session: Not on file  . Stress: Not on file  Relationships  . Social connections:    Talks on phone: Not on file    Gets together: Not on file    Attends religious service: Not on file    Active member of club or organization: Not on file    Attends meetings of clubs or organizations: Not on file    Relationship status: Not on file  . Intimate partner violence:    Fear of current or ex partner: Not on file    Emotionally abused: Not on file    Physically abused: Not on file    Forced sexual activity: Not on file  Other Topics Concern  . Not on file  Social History Narrative  . Not on file      Review of Systems  Constitutional: Positive for fatigue. Negative for chills and unexpected weight change.  HENT: Negative for congestion, postnasal drip, rhinorrhea, sneezing and sore throat.   Respiratory: Negative for cough, chest tightness, shortness of breath and wheezing.   Cardiovascular: Negative for chest pain and palpitations.  Gastrointestinal: Positive for constipation and nausea. Negative for abdominal pain, diarrhea and vomiting.  Endocrine: Negative for cold intolerance, heat intolerance, polydipsia, polyphagia and polyuria.  Genitourinary: Positive for frequency and urgency. Negative  for dysuria and flank pain.  Musculoskeletal: Negative for arthralgias, back pain, joint swelling and neck pain.  Skin: Negative for rash.  Neurological: Positive for dizziness, light-headedness and headaches. Negative for tremors and numbness.       Improved some since her last visit .  Hematological: Negative for adenopathy. Does not bruise/bleed easily.  Psychiatric/Behavioral: Negative for behavioral problems (Depression), sleep disturbance and suicidal ideas. The patient is not nervous/anxious.     Vital Signs: BP (!) 152/88   Pulse 81   Resp 16   Ht 5\' 6"  (1.676 m)   Wt  194 lb 6.4 oz (88.2 kg)   SpO2 97%   BMI 31.38 kg/m    Physical Exam Constitutional:      General: She is not in acute distress.    Appearance: Normal appearance. She is well-developed. She is not diaphoretic.  HENT:     Head: Normocephalic and atraumatic.     Mouth/Throat:     Pharynx: No oropharyngeal exudate.  Eyes:     Conjunctiva/sclera: Conjunctivae normal.     Pupils: Pupils are equal, round, and reactive to light.  Neck:     Musculoskeletal: Normal range of motion and neck supple.     Thyroid: No thyromegaly.     Vascular: No JVD.     Trachea: No tracheal deviation.  Cardiovascular:     Rate and Rhythm: Normal rate and regular rhythm.     Heart sounds: Normal heart sounds. No murmur. No friction rub. No gallop.   Pulmonary:     Effort: Pulmonary effort is normal. No respiratory distress.     Breath sounds: Normal breath sounds. No wheezing or rales.  Chest:     Chest wall: No tenderness.  Abdominal:     General: Bowel sounds are normal.     Palpations: Abdomen is soft.     Tenderness: There is abdominal tenderness.  Genitourinary:    Comments: U/a showing small WBC today. Musculoskeletal: Normal range of motion.  Lymphadenopathy:     Cervical: No cervical adenopathy.  Skin:    General: Skin is warm and dry.  Neurological:     General: No focal deficit present.     Mental Status:  She is alert and oriented to person, place, and time. Mental status is at baseline.     Cranial Nerves: No cranial nerve deficit.  Psychiatric:        Behavior: Behavior normal.        Thought Content: Thought content normal.        Judgment: Judgment normal.   Assessment/Plan:  1. Urinary tract infection without hematuria, site unspecified Improved but still present. Will do round macrobid 100mg  for 7 days. Send urine for culture and sensitivity and adjust antibiotics as indicated.  - nitrofurantoin, macrocrystal-monohydrate, (MACROBID) 100 MG capsule; Take 1 capsule (100 mg total) by mouth 2 (two) times daily.  Dispense: 14 capsule; Refill: 0  2. Chronic constipation Samples [facility] provided today. May be contributing to persistent UTI.   3. Dysuria - POCT Urinalysis Dipstick - CULTURE, URINE COMPREHENSIVE  4. Essential hypertension Generally stable. Will see cardiology next week for further evaluation and treatment.   General Counseling: ailah barna understanding of the findings of todays visit and agrees with plan of treatment. I have discussed any further diagnostic evaluation that may be needed or ordered today. We also reviewed her medications today. she has been encouraged to call the office with any questions or concerns that should arise related to todays visit.  This patient was seen by Leretha Pol FNP Collaboration with Dr Lavera Guise as a part of collaborative care agreement  Orders Placed This Encounter  Procedures  . CULTURE, URINE COMPREHENSIVE  . POCT Urinalysis Dipstick    Meds ordered this encounter  Medications  . nitrofurantoin, macrocrystal-monohydrate, (MACROBID) 100 MG capsule    Sig: Take 1 capsule (100 mg total) by mouth 2 (two) times daily.    Dispense:  14 capsule    Refill:  0    Order Specific Question:   Supervising Provider    Answer:  Lavera Guise [[id number]]    Time spent: 25 Minutes      Dr Lavera Guise Internal medicine

## 2018-07-01 NOTE — Progress Notes (Signed)
Pt blood pressure was elevated, machine did not read the first time, second time was done manually 190/86.

## 2018-07-04 LAB — CULTURE, URINE COMPREHENSIVE

## 2018-07-11 DIAGNOSIS — I480 Paroxysmal atrial fibrillation: Secondary | ICD-10-CM | POA: Diagnosis not present

## 2018-07-11 DIAGNOSIS — G4733 Obstructive sleep apnea (adult) (pediatric): Secondary | ICD-10-CM | POA: Diagnosis not present

## 2018-07-11 DIAGNOSIS — I10 Essential (primary) hypertension: Secondary | ICD-10-CM | POA: Diagnosis not present

## 2018-07-11 DIAGNOSIS — I495 Sick sinus syndrome: Secondary | ICD-10-CM | POA: Diagnosis not present

## 2018-07-11 DIAGNOSIS — E782 Mixed hyperlipidemia: Secondary | ICD-10-CM | POA: Diagnosis not present

## 2018-07-15 ENCOUNTER — Other Ambulatory Visit: Payer: Self-pay

## 2018-07-15 ENCOUNTER — Ambulatory Visit (INDEPENDENT_AMBULATORY_CARE_PROVIDER_SITE_OTHER): Payer: Medicare Other | Admitting: Nurse Practitioner

## 2018-07-15 ENCOUNTER — Encounter: Payer: Self-pay | Admitting: Nurse Practitioner

## 2018-07-15 VITALS — BP 150/78 | HR 69 | Resp 16 | Ht 66.0 in | Wt 191.8 lb

## 2018-07-15 DIAGNOSIS — K219 Gastro-esophageal reflux disease without esophagitis: Secondary | ICD-10-CM

## 2018-07-15 DIAGNOSIS — N390 Urinary tract infection, site not specified: Secondary | ICD-10-CM

## 2018-07-15 DIAGNOSIS — I10 Essential (primary) hypertension: Secondary | ICD-10-CM

## 2018-07-15 DIAGNOSIS — R319 Hematuria, unspecified: Secondary | ICD-10-CM

## 2018-07-15 LAB — POCT URINALYSIS DIPSTICK
BILIRUBIN UA: NEGATIVE
Glucose, UA: NEGATIVE
KETONES UA: NEGATIVE
Nitrite, UA: POSITIVE
PH UA: 6 (ref 5.0–8.0)
PROTEIN UA: POSITIVE — AB
Spec Grav, UA: 1.02 (ref 1.010–1.025)
UROBILINOGEN UA: 0.2 U/dL

## 2018-07-15 MED ORDER — CIPROFLOXACIN HCL 500 MG PO TABS: 500.0000 mg | ORAL_TABLET | Freq: Two times a day (BID) | ORAL | 0 refills | Status: DC

## 2018-07-15 MED ORDER — OMEPRAZOLE 40 MG PO CPDR: 40.0000 mg | DELAYED_RELEASE_CAPSULE | Freq: Every day | ORAL | 5 refills | Status: DC

## 2018-07-15 NOTE — Progress Notes (Signed)
[facility] [place], [place] [address]  Internal MEDICINE  Office Visit Note  Patient Name: Cathy Curtis  [id number]  [id number]  Date of Service: 07/31/2018  Chief Complaint  Patient presents with  . Urinary Tract Infection    2 wk follow up repeat UA  . Medical Management of Chronic Issues    medication refill    The patient was treated for uti at her last visit. Treated with round macrobid 100mg  bid. She has recently finished this antibiotic. . Culture did show infection with normal urogenital flora. She feels some better but is still feeling out of sorts. Does have some chronic constipation. May be contributing to persistent urinary tract infection.  Blood pressure improved today. She hsa seen her cardiologist since her most recent visit to me and he was pleased with blood pressures and no medication changes were made .      Current Medication: Outpatient Encounter Medications as of 07/15/2018  Medication Sig  . acetaminophen (TYLENOL) 500 MG tablet Take 500 mg by mouth every 6 (six) hours as needed.  Marland Kitchen apixaban (ELIQUIS) 5 MG TABS tablet Take 5 mg by mouth 2 (two) times daily.  Marland Kitchen atorvastatin (LIPITOR) 10 MG tablet 1 tablet once daily.  . Calcium Citrate-Vitamin D 200-250 MG-UNIT TABS Take 600 mg by mouth.   . celecoxib (CELEBREX) 200 MG capsule Take 1 capsule (200 mg total) by mouth daily. (Patient taking differently: Take 200 mg by mouth as needed. )  . cephALEXin (KEFLEX) 500 MG capsule Take 1 capsule (500 mg total) by mouth 3 (three) times daily.  . Cholecalciferol (VITAMIN D-3) 1000 UNITS CAPS Take by mouth daily.  . Coenzyme Q10 (CO Q-10) 100 MG CAPS Take by mouth daily.  . diclofenac sodium (VOLTAREN) 1 % GEL Apply 4 g topically 2 (two) times daily.  . hydrochlorothiazide (HYDRODIURIL) 12.5 MG tablet Take 1 tablet (12.5 mg total) by mouth daily.  . metoprolol tartrate (LOPRESSOR) 25 MG tablet Take 25 mg by mouth 2 (two) times daily.  Marland Kitchen  omeprazole (PRILOSEC) 40 MG capsule Take 1 capsule (40 mg total) by mouth daily.  Marland Kitchen oxybutynin (DITROPAN XL) 15 MG 24 hr tablet Take 1 tablet (15 mg total) by mouth at bedtime.  . valsartan (DIOVAN) 320 MG tablet Take 1 tablet (320 mg total) by mouth daily.  Marland Kitchen zolpidem (AMBIEN) 10 MG tablet Take 1 tablet (10 mg total) by mouth at bedtime as needed for sleep.  . [DISCONTINUED] omeprazole (PRILOSEC) 40 MG capsule Take 1 capsule (40 mg total) by mouth daily.  . ciprofloxacin (CIPRO) 500 MG tablet Take 1 tablet (500 mg total) by mouth 2 (two) times daily.  . [DISCONTINUED] nitrofurantoin, macrocrystal-monohydrate, (MACROBID) 100 MG capsule Take 1 capsule (100 mg total) by mouth 2 (two) times daily. (Patient not taking: Reported on 07/15/2018)   No facility-administered encounter medications on file as of 07/15/2018.     Surgical History: Past Surgical History:  Procedure Laterality Date  . ABDOMINAL HYSTERECTOMY    . CARDIOVERSION  2009, 2010  . CHOLECYSTECTOMY    . COLONOSCOPY  2010,02/2014   Dr. Jamal Collin, [facility]  . HEMORRHOID SURGERY  09-06-2007   stapled  . PACEMAKER INSERTION Left 09/2011  . SALPINGOOPHORECTOMY  1974    Medical History: Past Medical History:  Diagnosis Date  . Atrial fibrillation ([place]) 2016  . Atrial flutter ([place]) 2009  . Breast screening, unspecified   . Diffuse cystic mastopathy    FCD  . Family history  of malignant neoplasm of gastrointestinal tract   . Hemorrhoids 2009   resolved  . Obesity, unspecified   . Osteoarthritis   . Osteoporosis   . Personal history of tobacco use, presenting hazards to health   . Screening for obesity   . Sleep apnea    admits to c-pap  . Special screening for malignant neoplasms, colon   . Unspecified essential hypertension 2002    Family History: Family History  Problem Relation Age of Onset  . Colon cancer Father   . Colon cancer Sister   . Colon cancer Sister     Social History   Socioeconomic History  . Marital  status: Married    Spouse name: Not on file  . Number of children: Not on file  . Years of education: Not on file  . Highest education level: Not on file  Occupational History  . Not on file  Social Needs  . Financial resource strain: Not on file  . Food insecurity:    Worry: Not on file    Inability: Not on file  . Transportation needs:    Medical: Not on file    Non-medical: Not on file  Tobacco Use  . Smoking status: Former Smoker    Packs/day: 1.00    Years: 20.00    Pack years: 20.00    Types: Cigarettes    Last attempt to quit: 05/16/1991    Years since quitting: 27.2  . Smokeless tobacco: Never Used  Substance and Sexual Activity  . Alcohol use: Yes    Comment: rarely  . Drug use: No  . Sexual activity: Not on file  Lifestyle  . Physical activity:    Days per week: Not on file    Minutes per session: Not on file  . Stress: Not on file  Relationships  . Social connections:    Talks on phone: Not on file    Gets together: Not on file    Attends religious service: Not on file    Active member of club or organization: Not on file    Attends meetings of clubs or organizations: Not on file    Relationship status: Not on file  . Intimate partner violence:    Fear of current or ex partner: Not on file    Emotionally abused: Not on file    Physically abused: Not on file    Forced sexual activity: Not on file  Other Topics Concern  . Not on file  Social History Narrative  . Not on file      Review of Systems  Constitutional: Positive for fatigue. Negative for chills and unexpected weight change.  HENT: Negative for congestion, postnasal drip, rhinorrhea, sneezing and sore throat.   Respiratory: Negative for cough, chest tightness, shortness of breath and wheezing.   Cardiovascular: Negative for chest pain and palpitations.       Blood pressure improved.   Gastrointestinal: Positive for constipation and nausea. Negative for abdominal pain, diarrhea and vomiting.   Endocrine: Negative for cold intolerance, heat intolerance, polydipsia and polyuria.  Genitourinary: Positive for frequency and urgency. Negative for dysuria and flank pain.  Musculoskeletal: Negative for arthralgias, back pain, joint swelling and neck pain.  Skin: Negative for rash.  Neurological: Positive for dizziness and headaches. Negative for tremors, light-headedness and numbness.       Improved some since her last visit .  Hematological: Negative for adenopathy. Does not bruise/bleed easily.  Psychiatric/Behavioral: Negative for behavioral problems (Depression), sleep disturbance and  suicidal ideas. The patient is not nervous/anxious.     Today's Vitals   07/15/18 1153  BP: (!) 150/78  Pulse: 69  Resp: 16  SpO2: 97%  Weight: 191 lb 12.8 oz (87 kg)  Height: 5\' 6"  (1.676 m)   Body mass index is 30.96 kg/m.  Physical Exam Vitals signs and nursing note reviewed.  Constitutional:      General: She is not in acute distress.    Appearance: Normal appearance. She is well-developed. She is not diaphoretic.  HENT:     Head: Normocephalic and atraumatic.     Mouth/Throat:     Pharynx: No oropharyngeal exudate.  Eyes:     Conjunctiva/sclera: Conjunctivae normal.     Pupils: Pupils are equal, round, and reactive to light.  Neck:     Musculoskeletal: Normal range of motion and neck supple.     Thyroid: No thyromegaly.     Vascular: No JVD.     Trachea: No tracheal deviation.  Cardiovascular:     Rate and Rhythm: Normal rate and regular rhythm.     Heart sounds: Normal heart sounds. No murmur. No friction rub. No gallop.   Pulmonary:     Effort: Pulmonary effort is normal. No respiratory distress.     Breath sounds: Normal breath sounds. No wheezing or rales.  Chest:     Chest wall: No tenderness.  Abdominal:     General: Bowel sounds are normal.     Palpations: Abdomen is soft.     Tenderness: There is abdominal tenderness.  Genitourinary:    Comments: Urine sample  positive for large WBC and large blood. Also positive for nitrites . Musculoskeletal: Normal range of motion.  Lymphadenopathy:     Cervical: No cervical adenopathy.  Skin:    General: Skin is warm and dry.  Neurological:     General: No focal deficit present.     Mental Status: She is alert and oriented to person, place, and time. Mental status is at baseline.     Cranial Nerves: No cranial nerve deficit.  Psychiatric:        Behavior: Behavior normal.        Thought Content: Thought content normal.        Judgment: Judgment normal.   Assessment/Plan: 1. Urinary tract infection with hematuria, site unspecified Will start round of cipro 500mg  bid for 10 days. Urine to be sent for culture and sensitivity and antibiotics to be adjusted as indicated.  - ciprofloxacin (CIPRO) 500 MG tablet; Take 1 tablet (500 mg total) by mouth 2 (two) times daily.  Dispense: 20 tablet; Refill: 0  2. Hematuria, unspecified type Will ultrasound kidneys and bladder for further evaluation. Refer to urology as indicated.  - POCT Urinalysis Dipstick - CULTURE, URINE COMPREHENSIVE - [place], retroperitnl abd,  ltd - US Renal; Future  3. Essential hypertension Improved. Continue bp medication as prescribed. Follow up with cardiology as scheduled   4. Gastroesophageal reflux disease without esophagitis - omeprazole (PRILOSEC) 40 MG capsule; Take 1 capsule (40 mg total) by mouth daily.  Dispense: 30 capsule; Refill: 5  General Counseling: Chevonne verbalizes understanding of the findings of todays visit and agrees with plan of treatment. I have discussed any further diagnostic evaluation that may be needed or ordered today. We also reviewed her medications today. she has been encouraged to call the office with any questions or concerns that should arise related to todays visit.  This patient was seen by [place] with  Dr Lavera Guise as a part of collaborative care agreement  Orders Placed  This Encounter  Procedures  . CULTURE, URINE COMPREHENSIVE  . US Renal  . [place], retroperitnl abd,  ltd  . POCT Urinalysis Dipstick    Meds ordered this encounter  Medications  . ciprofloxacin (CIPRO) 500 MG tablet    Sig: Take 1 tablet (500 mg total) by mouth 2 (two) times daily.    Dispense:  20 tablet    Refill:  0    Order Specific Question:   Supervising Provider    Answer:   Lavera Guise [[id number]]  . omeprazole (PRILOSEC) 40 MG capsule    Sig: Take 1 capsule (40 mg total) by mouth daily.    Dispense:  30 capsule    Refill:  5    Order Specific Question:   Supervising Provider    Answer:   Lavera Guise [[id number]]    Time spent: 4 Minutes      Dr Lavera Guise Internal medicine

## 2018-07-15 NOTE — Progress Notes (Signed)
Pt blood pressure taken manually

## 2018-07-19 LAB — CULTURE, URINE COMPREHENSIVE

## 2018-07-24 ENCOUNTER — Ambulatory Visit: Payer: Self-pay | Admitting: Nurse Practitioner

## 2018-07-26 ENCOUNTER — Other Ambulatory Visit: Payer: Self-pay

## 2018-07-30 ENCOUNTER — Ambulatory Visit: Payer: Self-pay | Admitting: Nurse Practitioner

## 2018-07-31 DIAGNOSIS — R319 Hematuria, unspecified: Secondary | ICD-10-CM | POA: Insufficient documentation

## 2018-08-02 ENCOUNTER — Other Ambulatory Visit: Payer: Self-pay

## 2018-08-02 ENCOUNTER — Ambulatory Visit (INDEPENDENT_AMBULATORY_CARE_PROVIDER_SITE_OTHER): Payer: Medicare Other

## 2018-08-02 DIAGNOSIS — R319 Hematuria, unspecified: Secondary | ICD-10-CM | POA: Diagnosis not present

## 2018-08-09 ENCOUNTER — Ambulatory Visit: Payer: Self-pay | Admitting: Nurse Practitioner

## 2018-08-13 ENCOUNTER — Other Ambulatory Visit: Payer: Self-pay | Admitting: Nurse Practitioner

## 2018-08-13 DIAGNOSIS — M25562 Pain in left knee: Principal | ICD-10-CM

## 2018-08-13 DIAGNOSIS — M25561 Pain in right knee: Principal | ICD-10-CM

## 2018-08-13 DIAGNOSIS — G8929 Other chronic pain: Secondary | ICD-10-CM

## 2018-08-13 MED ORDER — CELECOXIB 200 MG PO CAPS: 200.0000 mg | ORAL_CAPSULE | Freq: Every day | ORAL | 12 refills | Status: DC

## 2018-08-21 ENCOUNTER — Ambulatory Visit: Payer: Self-pay

## 2018-08-26 ENCOUNTER — Other Ambulatory Visit: Payer: Self-pay

## 2018-08-26 MED ORDER — ATORVASTATIN CALCIUM 10 MG PO TABS: ORAL_TABLET | ORAL | 1 refills | Status: DC

## 2018-09-12 ENCOUNTER — Ambulatory Visit: Payer: Self-pay | Admitting: Nurse Practitioner

## 2018-09-18 ENCOUNTER — Ambulatory Visit: Payer: Self-pay

## 2018-09-25 ENCOUNTER — Other Ambulatory Visit: Payer: Self-pay

## 2018-09-25 ENCOUNTER — Ambulatory Visit (INDEPENDENT_AMBULATORY_CARE_PROVIDER_SITE_OTHER): Payer: Medicare Other

## 2018-09-25 DIAGNOSIS — G4733 Obstructive sleep apnea (adult) (pediatric): Secondary | ICD-10-CM | POA: Diagnosis not present

## 2018-09-25 NOTE — Progress Notes (Signed)
95 percentile pressure 9   95th percentile leak 0.18    apnea-hypopnea index  1.6 /hr   total days used  >4 hr 88 days  total days used <4 hr 2 days  Total compliance 97.8 percent  She is doing great. No problems or questions at this time

## 2018-10-15 DIAGNOSIS — Z1231 Encounter for screening mammogram for malignant neoplasm of breast: Secondary | ICD-10-CM | POA: Diagnosis not present

## 2018-10-18 ENCOUNTER — Encounter: Payer: Self-pay | Admitting: General Surgery

## 2018-10-22 ENCOUNTER — Encounter: Payer: Self-pay | Admitting: General Surgery

## 2018-10-22 ENCOUNTER — Other Ambulatory Visit: Payer: Self-pay

## 2018-10-22 ENCOUNTER — Ambulatory Visit (INDEPENDENT_AMBULATORY_CARE_PROVIDER_SITE_OTHER): Payer: Medicare Other | Admitting: General Surgery

## 2018-10-22 ENCOUNTER — Telehealth: Payer: Self-pay | Admitting: *Deleted

## 2018-10-22 VITALS — BP 166/80 | HR 67 | Temp 97.5°F | Resp 14 | Ht 66.0 in | Wt 190.0 lb

## 2018-10-22 DIAGNOSIS — Z800 Family history of malignant neoplasm of digestive organs: Secondary | ICD-10-CM

## 2018-10-22 DIAGNOSIS — N6019 Diffuse cystic mastopathy of unspecified breast: Secondary | ICD-10-CM | POA: Diagnosis not present

## 2018-10-22 MED ORDER — POLYETHYLENE GLYCOL 3350 17 GM/SCOOP PO POWD: ORAL | 0 refills | Status: DC

## 2018-10-22 NOTE — Progress Notes (Signed)
Patient ID: Cathy Curtis, female   DOB: 07/02/1940, 78 y.o.   MRN: [id number]  Chief Complaint  Patient presents with  . Follow-up    mammogram     HPI Cathy Curtis is a 78 y.o. female who presents for a breast evaluation. The most recent mammogram was done on 10/15/2018. Patient does perform regular self breast checks and gets regular mammograms done. She reports no new breast issues.    HPI  Past Medical History:  Diagnosis Date  . Atrial fibrillation ([place]) 2016  . Atrial flutter ([place]) 2009  . Breast screening, unspecified   . Diffuse cystic mastopathy    FCD  . Family history of malignant neoplasm of gastrointestinal tract   . Hemorrhoids 2009   resolved  . Obesity, unspecified   . Osteoarthritis   . Osteoporosis   . Personal history of tobacco use, presenting hazards to health   . Screening for obesity   . Sleep apnea    admits to c-pap  . Special screening for malignant neoplasms, colon   . Unspecified essential hypertension 2002    Past Surgical History:  Procedure Laterality Date  . ABDOMINAL HYSTERECTOMY    . CARDIOVERSION  2009, 2010  . CHOLECYSTECTOMY    . COLONOSCOPY  2010,02/2014   Dr. Jamal Collin, [facility]  . HEMORRHOID SURGERY  09-06-2007   stapled  . PACEMAKER INSERTION Left 09/2011  . SALPINGOOPHORECTOMY  1974    Family History  Problem Relation Age of Onset  . Colon cancer Father   . Colon cancer Sister   . Colon cancer Sister     Social History Social History   Tobacco Use  . Smoking status: Former Smoker    Packs/day: 1.00    Years: 20.00    Pack years: 20.00    Types: Cigarettes    Last attempt to quit: 05/16/1991    Years since quitting: 27.4  . Smokeless tobacco: Never Used  Substance Use Topics  . Alcohol use: Yes    Comment: rarely  . Drug use: No    Allergies  Allergen Reactions  . Prevacid [Lansoprazole] Other (See Comments)    whelps    Current Outpatient Medications  Medication Sig Dispense Refill  . acetaminophen (TYLENOL)  500 MG tablet Take 500 mg by mouth every 6 (six) hours as needed.    Marland Kitchen apixaban (ELIQUIS) 5 MG TABS tablet Take 5 mg by mouth 2 (two) times daily.    Marland Kitchen atorvastatin (LIPITOR) 10 MG tablet 1 tablet once daily. 90 tablet 1  . Calcium Citrate-Vitamin D 200-250 MG-UNIT TABS Take 600 mg by mouth.     . celecoxib (CELEBREX) 200 MG capsule Take 1 capsule (200 mg total) by mouth daily. 30 capsule 12  . Cholecalciferol (VITAMIN D-3) 1000 UNITS CAPS Take by mouth daily.    . Coenzyme Q10 (CO Q-10) 100 MG CAPS Take by mouth daily.    . diclofenac sodium (VOLTAREN) 1 % GEL Apply 4 g topically 2 (two) times daily. 4 Tube 12  . hydrochlorothiazide (HYDRODIURIL) 12.5 MG tablet Take 1 tablet (12.5 mg total) by mouth daily. 30 tablet 3  . metoprolol tartrate (LOPRESSOR) 25 MG tablet Take 25 mg by mouth 2 (two) times daily.    Marland Kitchen omeprazole (PRILOSEC) 40 MG capsule Take 1 capsule (40 mg total) by mouth daily. 30 capsule 5  . oxybutynin (DITROPAN XL) 15 MG 24 hr tablet Take 1 tablet (15 mg total) by mouth at bedtime. 90 tablet 3  . valsartan (  DIOVAN) 320 MG tablet Take 1 tablet (320 mg total) by mouth daily. 30 tablet 11  . zolpidem (AMBIEN) 10 MG tablet Take 1 tablet (10 mg total) by mouth at bedtime as needed for sleep. 30 tablet 3  . polyethylene glycol powder (GLYCOLAX/MIRALAX) 17 GM/SCOOP powder 255 grams one bottle for colonoscopy prep 255 g 0   No current facility-administered medications for this visit.     Review of Systems Review of Systems  Constitutional: Negative.   Respiratory: Negative.   Cardiovascular: Negative.     Blood pressure (!) 166/80, pulse 67, temperature (!) 97.5 F (36.4 C), resp. rate 14, height 5' 6"  (1.676 m), weight 190 lb (86.2 kg), SpO2 96 %.  Physical Exam Physical Exam Constitutional:      Appearance: She is well-developed.  Eyes:     General: No scleral icterus.    Conjunctiva/sclera: Conjunctivae normal.  Neck:     Musculoskeletal: Neck supple.  Cardiovascular:      Rate and Rhythm: Normal rate and regular rhythm.     Heart sounds: Normal heart sounds.  Pulmonary:     Effort: Pulmonary effort is normal.     Breath sounds: Normal breath sounds.  Chest:     Breasts:        Right: No inverted nipple, mass, nipple discharge, skin change or tenderness.        Left: No inverted nipple, mass, nipple discharge, skin change or tenderness.    Lymphadenopathy:     Cervical: No cervical adenopathy.     Upper Body:     Right upper body: No supraclavicular or axillary adenopathy.     Left upper body: No supraclavicular or axillary adenopathy.  Skin:    General: Skin is warm and dry.  Neurological:     Mental Status: She is alert and oriented to person, place, and time.     Data Reviewed Bilateral screening mammograms completed on May 16, 2018 were reported as BI-RADS-2.  Colonoscopy of 2015 showed diverticulosis.  Laboratory studies dated April 03, 2018 showed normal CBC with a hemoglobin of 12.2, white blood cell count of 6400, MCV of 91 and platelet count of 190,000. Comprehensive metabolic panel the same date was normal.  EGFR: 68.  Normal electrolytes.  Assessment No evidence of active breast disease.  Patient desires to continue annual follow-up through this office.  Candidate for screening colonoscopy based on strong family history of colon cancer.  Plan Patient will be asked to return to the office in one year with a bilateral screening mammogram@ [facility]. The patient is aware to call back for any questions or new concerns.  The patient wants to postpone her colonoscopy until late summer after all the excitement regarding the recent pandemic has settled down.  This is acceptable.  HPI, Physical Exam, Assessment and Plan have been scribed under the direction and in the presence of Robert Bellow, MD  Concepcion Living, LPN  I have completed the exam and reviewed the above documentation for accuracy and completeness.  I agree  with the above.  Haematologist has been used and any errors in dictation or transcription are unintentional.  Hervey Ard, M.D., F.A.C.S.  Forest Gleason  10/23/2018, 8:53 AM  Patient has been scheduled for a colonoscopy on 01-29-19 at [facility]. Miralax prescription has been sent in to the patient's pharmacy today. Patient has been asked to stop Eliquis 2 days prior to procedure. Patient to have COVID testing on 01-29-19 at the [facility] between 10:30 am and 12:30 pm and aware to isolate after. Colonoscopy instructions have been reviewed with the patient. This patient is aware to call the office if they have further questions.   Dominga Ferry, CMA

## 2018-10-22 NOTE — Telephone Encounter (Signed)
Patient contacted and notified that she will need to go for COVID testing on 01-24-19 instead of 01-23-19 as we originally discussed.   Per [facility] staff, this only needs to be done 72 hours prior now.   Patient verbalizes understanding.

## 2018-10-22 NOTE — Patient Instructions (Addendum)
Patient will be asked to return to the office in one year with a bilateral screening mammogram@ [facility]. The patient is aware to call back for any questions or new concerns.  Colonoscopy, Adult A colonoscopy is an exam to look at the entire large intestine. During the exam, a lubricated, flexible tube that has a camera on the end of it is inserted into the anus and then passed into the rectum, colon, and other parts of the large intestine. You may have a colonoscopy as a part of normal colorectal screening or if you have certain symptoms, such as:  Lack of red blood cells (anemia).  Diarrhea that does not go away.  Abdominal pain.  Blood in your stool (feces). A colonoscopy can help screen for and diagnose medical problems, including:  Tumors.  Polyps.  Inflammation.  Areas of bleeding. Tell a health care provider about:  Any allergies you have.  All medicines you are taking, including vitamins, herbs, eye drops, creams, and over-the-counter medicines.  Any problems you or family members have had with anesthetic medicines.  Any blood disorders you have.  Any surgeries you have had.  Any medical conditions you have.  Any problems you have had passing stool. What are the risks? Generally, this is a safe procedure. However, problems may occur, including:  Bleeding.  A tear in the intestine.  A reaction to medicines given during the exam.  Infection (rare). What happens before the procedure? Eating and drinking restrictions Follow instructions from your health care provider about eating and drinking, which may include:  A few days before the procedure - follow a low-fiber diet. Avoid nuts, seeds, dried fruit, raw fruits, and vegetables.  1-3 days before the procedure - follow a clear liquid diet. Drink only clear liquids, such as clear broth or bouillon, black coffee or tea, clear juice, clear soft drinks or sports drinks, gelatin dessert, and popsicles. Avoid any  liquids that contain red or purple dye.  On the day of the procedure - do not eat or drink anything starting 2 hours before the procedure, or within the time period that your health care provider recommends. Up to 2 hours before the procedure, you may continue to drink clear liquids, such as water or clear fruit juice. Bowel prep If you were prescribed an oral bowel prep to clean out your colon:  Take it as told by your health care provider. Starting the day before your procedure, you will need to drink a large amount of medicated liquid. The liquid will cause you to have multiple loose stools until your stool is almost clear or light green.  If your skin or anus gets irritated from diarrhea, you may use these to relieve the irritation: ? Medicated wipes, such as adult wet wipes with aloe and vitamin E. ? A skin-soothing product like petroleum jelly.  If you vomit while drinking the bowel prep, take a break for up to 60 minutes and then begin the bowel prep again. If vomiting continues and you cannot take the bowel prep without vomiting, call your health care provider.  To clean out your colon, you may also be given: ? Laxative medicines. ? Instructions about how to use an enema. General instructions  Ask your health care provider about: ? Changing or stopping your regular medicines or supplements. This is especially important if you are taking iron supplements, diabetes medicines, or blood thinners. ? Taking medicines such as aspirin and ibuprofen. These medicines can thin your blood. Do not  take these medicines before the procedure if your health care provider tells you not to.  Plan to have someone take you home from the hospital or clinic. What happens during the procedure?   An IV may be inserted into one of your veins.  You will be given medicine to help you relax (sedative).  To reduce your risk of infection: ? Your health care team will wash or sanitize their hands. ? Your  anal area will be washed with soap.  You will be asked to lie on your side with your knees bent.  Your health care provider will lubricate a long, thin, flexible tube. The tube will have a camera and a light on the end.  The tube will be inserted into your anus.  The tube will be gently eased through your rectum and colon.  Air will be delivered into your colon to keep it open. You may feel some pressure or cramping.  The camera will be used to take images during the procedure.  A small tissue sample may be removed to be examined under a microscope (biopsy).  If small polyps are found, your health care provider may remove them and have them checked for cancer cells.  When the exam is done, the tube will be removed. The procedure may vary among health care providers and hospitals. What happens after the procedure?  Your blood pressure, heart rate, breathing rate, and blood oxygen level will be monitored until the medicines you were given have worn off.  Do not drive for 24 hours after the exam.  You may have a small amount of blood in your stool.  You may pass gas and have mild abdominal cramping or bloating due to the air that was used to inflate your colon during the exam.  It is up to you to get the results of your procedure. Ask your health care provider, or the department performing the procedure, when your results will be ready. Summary  A colonoscopy is an exam to look at the entire large intestine.  During a colonoscopy, a lubricated, flexible tube with a camera on the end of it is inserted into the anus and then passed into the colon and other parts of the large intestine.  Follow instructions from your health care provider about eating and drinking before the procedure.  If you were prescribed an oral bowel prep to clean out your colon, take it as told by your health care provider.  After your procedure, your blood pressure, heart rate, breathing rate, and blood  oxygen level will be monitored until the medicines you were given have worn off. This information is not intended to replace advice given to you by your health care provider. Make sure you discuss any questions you have with your health care provider. Document Released: 04/28/2000 Document Revised: 02/21/2017 Document Reviewed: 07/13/2015 Elsevier Interactive Patient Education  2019 [employer].

## 2018-10-23 DIAGNOSIS — Z800 Family history of malignant neoplasm of digestive organs: Secondary | ICD-10-CM | POA: Insufficient documentation

## 2018-11-05 DIAGNOSIS — I495 Sick sinus syndrome: Secondary | ICD-10-CM | POA: Diagnosis not present

## 2018-11-12 ENCOUNTER — Other Ambulatory Visit: Payer: Self-pay

## 2018-11-12 ENCOUNTER — Ambulatory Visit (INDEPENDENT_AMBULATORY_CARE_PROVIDER_SITE_OTHER): Payer: Medicare Other | Admitting: Nurse Practitioner

## 2018-11-12 ENCOUNTER — Encounter: Payer: Self-pay | Admitting: Nurse Practitioner

## 2018-11-12 VITALS — BP 140/80 | HR 74 | Resp 16 | Ht 66.0 in | Wt 190.0 lb

## 2018-11-12 DIAGNOSIS — I10 Essential (primary) hypertension: Secondary | ICD-10-CM

## 2018-11-12 DIAGNOSIS — F5101 Primary insomnia: Secondary | ICD-10-CM | POA: Diagnosis not present

## 2018-11-12 DIAGNOSIS — E782 Mixed hyperlipidemia: Secondary | ICD-10-CM

## 2018-11-12 NOTE — Progress Notes (Signed)
[facility] [place], [place] [address]  Internal MEDICINE  Office Visit Note  Patient Name: Cathy Curtis  [id number]  [id number]  Date of Service: 11/16/2018  Chief Complaint  Patient presents with  . Hypertension  . Hyperlipidemia    The patient is here for routine follow up visit. She is doing well without concerns or complaints. Blood pressure is well controlled. She had screening mammogram early June, 2020 and it was benign. She is scheduled for screening colonoscopy in September, 2020.       Current Medication: Outpatient Encounter Medications as of 11/12/2018  Medication Sig  . acetaminophen (TYLENOL) 500 MG tablet Take 500 mg by mouth every 6 (six) hours as needed.  Marland Kitchen apixaban (ELIQUIS) 5 MG TABS tablet Take 5 mg by mouth 2 (two) times daily.  Marland Kitchen atorvastatin (LIPITOR) 10 MG tablet 1 tablet once daily.  . Calcium Citrate-Vitamin D 200-250 MG-UNIT TABS Take 600 mg by mouth.   . celecoxib (CELEBREX) 200 MG capsule Take 1 capsule (200 mg total) by mouth daily.  . Cholecalciferol (VITAMIN D-3) 1000 UNITS CAPS Take by mouth daily.  . diclofenac sodium (VOLTAREN) 1 % GEL Apply 4 g topically 2 (two) times daily.  . hydrochlorothiazide (HYDRODIURIL) 12.5 MG tablet Take 1 tablet (12.5 mg total) by mouth daily.  . metoprolol tartrate (LOPRESSOR) 25 MG tablet Take 25 mg by mouth 2 (two) times daily.  Marland Kitchen omeprazole (PRILOSEC) 40 MG capsule Take 1 capsule (40 mg total) by mouth daily.  Marland Kitchen oxybutynin (DITROPAN XL) 15 MG 24 hr tablet Take 1 tablet (15 mg total) by mouth at bedtime.  Marland Kitchen zolpidem (AMBIEN) 10 MG tablet Take 1 tablet (10 mg total) by mouth at bedtime as needed for sleep.  . Coenzyme Q10 (CO Q-10) 100 MG CAPS Take by mouth daily.  . polyethylene glycol powder (GLYCOLAX/MIRALAX) 17 GM/SCOOP powder 255 grams one bottle for colonoscopy prep (Patient not taking: Reported on 11/12/2018)  . telmisartan (MICARDIS) 80 MG tablet   . [DISCONTINUED] valsartan  (DIOVAN) 320 MG tablet Take 1 tablet (320 mg total) by mouth daily. (Patient not taking: Reported on 11/12/2018)   No facility-administered encounter medications on file as of 11/12/2018.     Surgical History: Past Surgical History:  Procedure Laterality Date  . ABDOMINAL HYSTERECTOMY    . CARDIOVERSION  2009, 2010  . CHOLECYSTECTOMY    . COLONOSCOPY  2010,02/2014   Dr. Jamal Collin, [facility]  . HEMORRHOID SURGERY  09-06-2007   stapled  . PACEMAKER INSERTION Left 09/2011  . SALPINGOOPHORECTOMY  1974    Medical History: Past Medical History:  Diagnosis Date  . Atrial fibrillation ([place]) 2016  . Atrial flutter ([place]) 2009  . Breast screening, unspecified   . Diffuse cystic mastopathy    FCD  . Family history of malignant neoplasm of gastrointestinal tract   . Hemorrhoids 2009   resolved  . Hyperlipidemia   . Obesity, unspecified   . Osteoarthritis   . Osteoporosis   . Personal history of tobacco use, presenting hazards to health   . Screening for obesity   . Sleep apnea    admits to c-pap  . Special screening for malignant neoplasms, colon   . Unspecified essential hypertension 2002    Family History: Family History  Problem Relation Age of Onset  . Colon cancer Father   . Colon cancer Sister   . Colon cancer Sister     Social History   Socioeconomic History  . Marital status: Married  Spouse name: Not on file  . Number of children: Not on file  . Years of education: Not on file  . Highest education level: Not on file  Occupational History  . Not on file  Social Needs  . Financial resource strain: Not on file  . Food insecurity    Worry: Not on file    Inability: Not on file  . Transportation needs    Medical: Not on file    Non-medical: Not on file  Tobacco Use  . Smoking status: Former Smoker    Packs/day: 1.00    Years: 20.00    Pack years: 20.00    Types: Cigarettes    Quit date: 05/16/1991    Years since quitting: 27.5  . Smokeless tobacco: Never Used   Substance and Sexual Activity  . Alcohol use: Yes    Comment: rarely  . Drug use: No  . Sexual activity: Not on file  Lifestyle  . Physical activity    Days per week: Not on file    Minutes per session: Not on file  . Stress: Not on file  Relationships  . Social Herbalist on phone: Not on file    Gets together: Not on file    Attends religious service: Not on file    Active member of club or organization: Not on file    Attends meetings of clubs or organizations: Not on file    Relationship status: Not on file  . Intimate partner violence    Fear of current or ex partner: Not on file    Emotionally abused: Not on file    Physically abused: Not on file    Forced sexual activity: Not on file  Other Topics Concern  . Not on file  Social History Narrative  . Not on file      Review of Systems  Constitutional: Negative for chills, fatigue and unexpected weight change.  HENT: Negative for congestion, postnasal drip, rhinorrhea, sneezing and sore throat.   Respiratory: Negative for cough, chest tightness, shortness of breath and wheezing.   Cardiovascular: Negative for chest pain and palpitations.       Blood pressure improved.   Gastrointestinal: Negative for abdominal pain, constipation, diarrhea, nausea and vomiting.  Endocrine: Negative for cold intolerance, heat intolerance, polydipsia and polyuria.  Musculoskeletal: Negative for arthralgias, back pain, joint swelling and neck pain.  Skin: Negative for rash.  Allergic/Immunologic: Negative for environmental allergies.  Neurological: Negative for dizziness, tremors, light-headedness, numbness and headaches.  Hematological: Negative for adenopathy. Does not bruise/bleed easily.  Psychiatric/Behavioral: Negative for behavioral problems (Depression), sleep disturbance and suicidal ideas. The patient is not nervous/anxious.     Today's Vitals   11/12/18 1123  BP: 140/80  Pulse: 74  Resp: 16  SpO2: 97%   Weight: 190 lb (86.2 kg)  Height: 5\' 6"  (1.676 m)   Body mass index is 30.67 kg/m.  Physical Exam Vitals signs and nursing note reviewed.  Constitutional:      General: She is not in acute distress.    Appearance: Normal appearance. She is well-developed. She is not diaphoretic.  HENT:     Head: Normocephalic and atraumatic.     Mouth/Throat:     Pharynx: No oropharyngeal exudate.  Eyes:     Conjunctiva/sclera: Conjunctivae normal.     Pupils: Pupils are equal, round, and reactive to light.  Neck:     Musculoskeletal: Normal range of motion and neck supple.  Thyroid: No thyromegaly.     Vascular: No JVD.     Trachea: No tracheal deviation.  Cardiovascular:     Rate and Rhythm: Normal rate and regular rhythm.     Heart sounds: Normal heart sounds. No murmur. No friction rub. No gallop.   Pulmonary:     Effort: Pulmonary effort is normal. No respiratory distress.     Breath sounds: Normal breath sounds. No wheezing or rales.  Chest:     Chest wall: No tenderness.  Abdominal:     General: Bowel sounds are normal.     Palpations: Abdomen is soft.     Tenderness: There is no abdominal tenderness.  Musculoskeletal: Normal range of motion.  Lymphadenopathy:     Cervical: No cervical adenopathy.  Skin:    General: Skin is warm and dry.  Neurological:     General: No focal deficit present.     Mental Status: She is alert and oriented to person, place, and time. Mental status is at baseline.     Cranial Nerves: No cranial nerve deficit.  Psychiatric:        Behavior: Behavior normal.        Thought Content: Thought content normal.        Judgment: Judgment normal.    Assessment/Plan: 1. Essential hypertension Stable. Continue bp medication as prescribed   2. Hyperlipemia, mixed Continue atorvastatin as prescribed.   3. Primary insomnia May use zolpidem as needed and as prescribed.   General Counseling: donovan gatchel understanding of the findings of todays  visit and agrees with plan of treatment. I have discussed any further diagnostic evaluation that may be needed or ordered today. We also reviewed her medications today. she has been encouraged to call the office with any questions or concerns that should arise related to todays visit.  Hypertension Counseling:   The following hypertensive lifestyle modification were recommended and discussed:  1. Limiting alcohol intake to less than 1 oz/day of ethanol:(24 oz of beer or 8 oz of wine or 2 oz of 100-proof whiskey). 2. Take baby ASA 81 mg daily. 3. Importance of regular aerobic exercise and losing weight. 4. Reduce dietary saturated fat and cholesterol intake for overall cardiovascular health. 5. Maintaining adequate dietary potassium, calcium, and magnesium intake. 6. Regular monitoring of the blood pressure. 7. Reduce sodium intake to less than 100 mmol/day (less than 2.3 gm of sodium or less than 6 gm of sodium choride)   This patient was seen by [place] with Dr Lavera Guise as a part of collaborative care agreement  Time spent: 67 Minutes      Dr Lavera Guise Internal medicine

## 2018-11-25 ENCOUNTER — Telehealth: Payer: Self-pay

## 2018-11-25 ENCOUNTER — Other Ambulatory Visit: Payer: Self-pay | Admitting: Nurse Practitioner

## 2018-11-25 DIAGNOSIS — F5101 Primary insomnia: Secondary | ICD-10-CM

## 2018-11-25 MED ORDER — ZOLPIDEM TARTRATE 10 MG PO TABS: 10.0000 mg | ORAL_TABLET | Freq: Every evening | ORAL | 3 refills | Status: DC | PRN

## 2018-11-25 NOTE — Telephone Encounter (Signed)
Approved zolpidem 10mg  at bedtime as needed per patient request

## 2018-11-25 NOTE — Progress Notes (Signed)
Approved zolpidem 10mg  at bedtime as needed per patient request

## 2018-12-03 ENCOUNTER — Telehealth: Payer: Self-pay | Admitting: *Deleted

## 2018-12-03 NOTE — Telephone Encounter (Signed)
Patient contacted today and notified that Dr. Bary Castilla is no longer with the practice.   She is aware that we do not have any other physicians here at the office that do colonoscopies.   Patient was offered a referral to [facility] or the office of her choosing.   The patient states she sees Dr. Humphrey Rolls at [facility] and will discuss with him.   Patient aware to contact our office if we can be of further assistance.

## 2018-12-09 ENCOUNTER — Other Ambulatory Visit: Payer: Self-pay

## 2018-12-09 DIAGNOSIS — K219 Gastro-esophageal reflux disease without esophagitis: Secondary | ICD-10-CM

## 2018-12-09 MED ORDER — OMEPRAZOLE 40 MG PO CPDR: 40.0000 mg | DELAYED_RELEASE_CAPSULE | Freq: Every day | ORAL | 5 refills | Status: DC

## 2018-12-13 ENCOUNTER — Encounter: Payer: Self-pay | Admitting: General Surgery

## 2018-12-30 DIAGNOSIS — H25813 Combined forms of age-related cataract, bilateral: Secondary | ICD-10-CM | POA: Diagnosis not present

## 2018-12-31 DIAGNOSIS — G4733 Obstructive sleep apnea (adult) (pediatric): Secondary | ICD-10-CM | POA: Diagnosis not present

## 2018-12-31 DIAGNOSIS — I495 Sick sinus syndrome: Secondary | ICD-10-CM | POA: Diagnosis not present

## 2018-12-31 DIAGNOSIS — I10 Essential (primary) hypertension: Secondary | ICD-10-CM | POA: Diagnosis not present

## 2018-12-31 DIAGNOSIS — I480 Paroxysmal atrial fibrillation: Secondary | ICD-10-CM | POA: Diagnosis not present

## 2018-12-31 DIAGNOSIS — E782 Mixed hyperlipidemia: Secondary | ICD-10-CM | POA: Diagnosis not present

## 2019-01-14 ENCOUNTER — Other Ambulatory Visit: Payer: Self-pay

## 2019-01-14 DIAGNOSIS — N393 Stress incontinence (female) (male): Secondary | ICD-10-CM

## 2019-01-14 MED ORDER — OXYBUTYNIN CHLORIDE ER 15 MG PO TB24: 15.0000 mg | ORAL_TABLET | Freq: Every day | ORAL | 3 refills | Status: DC

## 2019-01-24 ENCOUNTER — Other Ambulatory Visit: Payer: Medicare Other

## 2019-01-29 ENCOUNTER — Ambulatory Visit: Admit: 2019-01-29 | Payer: Medicare Other | Admitting: General Surgery

## 2019-01-29 SURGERY — COLONOSCOPY WITH PROPOFOL
Anesthesia: General

## 2019-02-03 DIAGNOSIS — Z23 Encounter for immunization: Secondary | ICD-10-CM | POA: Diagnosis not present

## 2019-02-20 ENCOUNTER — Other Ambulatory Visit: Payer: Self-pay

## 2019-02-20 ENCOUNTER — Telehealth: Payer: Self-pay

## 2019-02-20 ENCOUNTER — Encounter: Payer: Self-pay | Admitting: Internal Medicine

## 2019-02-20 ENCOUNTER — Ambulatory Visit (INDEPENDENT_AMBULATORY_CARE_PROVIDER_SITE_OTHER): Payer: Medicare Other | Admitting: Internal Medicine

## 2019-02-20 VITALS — BP 122/80 | HR 64 | Temp 97.8°F | Resp 16 | Ht 66.0 in | Wt 193.0 lb

## 2019-02-20 DIAGNOSIS — Z9989 Dependence on other enabling machines and devices: Secondary | ICD-10-CM | POA: Diagnosis not present

## 2019-02-20 DIAGNOSIS — G4733 Obstructive sleep apnea (adult) (pediatric): Secondary | ICD-10-CM | POA: Diagnosis not present

## 2019-02-20 DIAGNOSIS — Z7901 Long term (current) use of anticoagulants: Secondary | ICD-10-CM | POA: Diagnosis not present

## 2019-02-20 DIAGNOSIS — I10 Essential (primary) hypertension: Secondary | ICD-10-CM

## 2019-02-20 DIAGNOSIS — Z950 Presence of cardiac pacemaker: Secondary | ICD-10-CM

## 2019-02-20 NOTE — Progress Notes (Signed)
[facility] [place], [place] [address]  Pulmonary Sleep Medicine   Office Visit Note  Patient Name: Cathy Curtis DOB: Aug 14, 1940 MRN [id number]  Date of Service: 02/20/2019  Complaints/HPI: Pt is here for follow up on osa.  She continues to be compliant with her CPAP every night.   She is cleaning her machine by hand, and changing her filters and tubing as directed.  She denies chest pain, sob or headaches.     ROS  General: (-) fever, (-) chills, (-) night sweats, (-) weakness Skin: (-) rashes, (-) itching,. Eyes: (-) visual changes, (-) redness, (-) itching. Nose and Sinuses: (-) nasal stuffiness or itchiness, (-) postnasal drip, (-) nosebleeds, (-) sinus trouble. Mouth and Throat: (-) sore throat, (-) hoarseness. Neck: (-) swollen glands, (-) enlarged thyroid, (-) neck pain. Respiratory: - cough, (-) bloody sputum, - shortness of breath, - wheezing. Cardiovascular: - ankle swelling, (-) chest pain. Lymphatic: (-) lymph node enlargement. Neurologic: (-) numbness, (-) tingling. Psychiatric: (-) anxiety, (-) depression   Current Medication: Outpatient Encounter Medications as of 02/20/2019  Medication Sig  . acetaminophen (TYLENOL) 500 MG tablet Take 500 mg by mouth every 6 (six) hours as needed.  Marland Kitchen apixaban (ELIQUIS) 5 MG TABS tablet Take 5 mg by mouth 2 (two) times daily.  Marland Kitchen atorvastatin (LIPITOR) 10 MG tablet 1 tablet once daily.  . Calcium Citrate-Vitamin D 200-250 MG-UNIT TABS Take 600 mg by mouth.   . celecoxib (CELEBREX) 200 MG capsule Take 1 capsule (200 mg total) by mouth daily.  . Cholecalciferol (VITAMIN D-3) 1000 UNITS CAPS Take by mouth daily.  . Coenzyme Q10 (CO Q-10) 100 MG CAPS Take by mouth daily.  . diclofenac sodium (VOLTAREN) 1 % GEL Apply 4 g topically 2 (two) times daily.  . hydrochlorothiazide (HYDRODIURIL) 12.5 MG tablet Take 1 tablet (12.5 mg total) by mouth daily.  . metoprolol tartrate (LOPRESSOR) 25 MG tablet Take 25 mg  by mouth 2 (two) times daily.  Marland Kitchen omeprazole (PRILOSEC) 40 MG capsule Take 1 capsule (40 mg total) by mouth daily.  Marland Kitchen oxybutynin (DITROPAN XL) 15 MG 24 hr tablet Take 1 tablet (15 mg total) by mouth at bedtime.  Marland Kitchen telmisartan (MICARDIS) 80 MG tablet   . zolpidem (AMBIEN) 10 MG tablet Take 1 tablet (10 mg total) by mouth at bedtime as needed for sleep.  . [DISCONTINUED] polyethylene glycol powder (GLYCOLAX/MIRALAX) 17 GM/SCOOP powder 255 grams one bottle for colonoscopy prep (Patient not taking: Reported on 11/12/2018)   No facility-administered encounter medications on file as of 02/20/2019.     Surgical History: Past Surgical History:  Procedure Laterality Date  . ABDOMINAL HYSTERECTOMY    . CARDIOVERSION  2009, 2010  . CHOLECYSTECTOMY    . COLONOSCOPY  2010,02/2014   Dr. Jamal Collin, [facility]  . HEMORRHOID SURGERY  09-06-2007   stapled  . PACEMAKER INSERTION Left 09/2011  . SALPINGOOPHORECTOMY  1974    Medical History: Past Medical History:  Diagnosis Date  . Atrial fibrillation ([place]) 2016  . Atrial flutter ([place]) 2009  . Breast screening, unspecified   . Diffuse cystic mastopathy    FCD  . Family history of malignant neoplasm of gastrointestinal tract   . Hemorrhoids 2009   resolved  . Hyperlipidemia   . Obesity, unspecified   . Osteoarthritis   . Osteoporosis   . Personal history of tobacco use, presenting hazards to health   . Screening for obesity   . Sleep apnea    admits to  c-pap  . Special screening for malignant neoplasms, colon   . Unspecified essential hypertension 2002    Family History: Family History  Problem Relation Age of Onset  . Colon cancer Father   . Colon cancer Sister   . Colon cancer Sister     Social History: Social History   Socioeconomic History  . Marital status: Married    Spouse name: Not on file  . Number of children: Not on file  . Years of education: Not on file  . Highest education level: Not on file  Occupational History  . Not on  file  Social Needs  . Financial resource strain: Not on file  . Food insecurity    Worry: Not on file    Inability: Not on file  . Transportation needs    Medical: Not on file    Non-medical: Not on file  Tobacco Use  . Smoking status: Former Smoker    Packs/day: 1.00    Years: 20.00    Pack years: 20.00    Types: Cigarettes    Quit date: 05/16/1991    Years since quitting: 27.7  . Smokeless tobacco: Never Used  Substance and Sexual Activity  . Alcohol use: Yes    Comment: rarely  . Drug use: No  . Sexual activity: Not on file  Lifestyle  . Physical activity    Days per week: Not on file    Minutes per session: Not on file  . Stress: Not on file  Relationships  . Social Herbalist on phone: Not on file    Gets together: Not on file    Attends religious service: Not on file    Active member of club or organization: Not on file    Attends meetings of clubs or organizations: Not on file    Relationship status: Not on file  . Intimate partner violence    Fear of current or ex partner: Not on file    Emotionally abused: Not on file    Physically abused: Not on file    Forced sexual activity: Not on file  Other Topics Concern  . Not on file  Social History Narrative  . Not on file    Vital Signs: Blood pressure 122/80, pulse 64, temperature 97.8 F (36.6 C), resp. rate 16, height 5\' 6"  (1.676 m), weight 193 lb (87.5 kg), SpO2 97 %.  Examination: General Appearance: The patient is well-developed, well-nourished, and in no distress. Skin: Gross inspection of skin unremarkable. Head: normocephalic, no gross deformities. Eyes: no gross deformities noted. ENT: ears appear grossly normal no exudates. Neck: Supple. No thyromegaly. No LAD. Respiratory: clear bilaterally. Cardiovascular: Normal S1 and S2 without murmur or rub. Extremities: No cyanosis. pulses are equal. Neurologic: Alert and oriented. No involuntary movements.  LABS: No results found for this  or any previous visit (from the past 2160 hour(s)).  Radiology: No results found.  No results found.  No results found.    Assessment and Plan: Patient Active Problem List   Diagnosis Date Noted  . Family history of colon cancer 10/23/2018  . Hematuria 07/31/2018  . Chronic constipation 07/01/2018  . Urinary tract infection with hematuria 06/23/2018  . Dysuria 06/23/2018  . Encounter for general adult medical examination with abnormal findings 05/05/2018  . Stress incontinence (female) (female) 05/05/2018  . Primary insomnia 05/05/2018  . Paroxysmal atrial fibrillation ([place]) 07/04/2017  . GERD (gastroesophageal reflux disease) 05/01/2017  . Sick sinus syndrome ([place]) 05/01/2017  .  Other fatigue 05/01/2017  . Moderate mitral insufficiency 11/02/2016  . Essential hypertension 03/01/2015  . Cardiac pacemaker 03/01/2015  . Hyperlipemia, mixed 03/06/2014  . Obstructive sleep apnea 03/06/2014  . Diffuse cystic mastopathy 08/06/2012  . Family history of malignant neoplasm of gastrointestinal tract 08/06/2012    1. OSA on CPAP Continue current therapy, pt has excellent compliance, will continue to follow.   2. Essential hypertension Stable, continue present management.   3. Cardiac pacemaker Continue to follow up with Dr. Nehemiah Massed  4. Chronic anticoagulation On eliquis, continue current management.     General Counseling: I have discussed the findings of the evaluation and examination with Tomi Bamberger.  I have also discussed any further diagnostic evaluation thatmay be needed or ordered today. Cristalle verbalizes understanding of the findings of todays visit. We also reviewed her medications today and discussed drug interactions and side effects including but not limited excessive drowsiness and altered mental states. We also discussed that there is always a risk not just to her but also people around her. she has been encouraged to call the office with any questions or concerns that  should arise related to todays visit.    Time spent: 25 This patient was seen by Orson Gear AGNP-C in Collaboration with Dr. Devona Konig as a part of collaborative care agreement.   I have personally obtained a history, examined the patient, evaluated laboratory and imaging results, formulated the assessment and plan and placed orders.    Allyne Gee, MD [facility] Pulmonary and Critical Care Sleep medicine

## 2019-02-26 NOTE — Telephone Encounter (Signed)
Lab slip given 02/26/2019. Appointment for AWV 12/22. Labs should be done about 1 week prior to this appointment to review. Thanks.

## 2019-02-26 NOTE — Telephone Encounter (Signed)
lmom we mailed labslip

## 2019-02-28 ENCOUNTER — Other Ambulatory Visit: Payer: Self-pay

## 2019-02-28 MED ORDER — ATORVASTATIN CALCIUM 10 MG PO TABS: ORAL_TABLET | ORAL | 1 refills | Status: DC

## 2019-03-25 ENCOUNTER — Telehealth: Payer: Self-pay

## 2019-03-25 NOTE — Telephone Encounter (Signed)
Left message confirming appt. BR

## 2019-03-26 ENCOUNTER — Other Ambulatory Visit: Payer: Self-pay

## 2019-03-26 ENCOUNTER — Ambulatory Visit (INDEPENDENT_AMBULATORY_CARE_PROVIDER_SITE_OTHER): Payer: Medicare Other

## 2019-03-26 DIAGNOSIS — Z9989 Dependence on other enabling machines and devices: Secondary | ICD-10-CM | POA: Diagnosis not present

## 2019-03-26 DIAGNOSIS — G4733 Obstructive sleep apnea (adult) (pediatric): Secondary | ICD-10-CM

## 2019-03-26 NOTE — Progress Notes (Signed)
95 percentile pressure 9   95th percentile leak 0.12   apnea-hypopnea index  1.5 /hr   total days used  >4 hr 86 days  total days used <4 hr 4 days  Total compliance 95.6 percent  No problems or questions at this time

## 2019-04-23 ENCOUNTER — Other Ambulatory Visit: Payer: Self-pay | Admitting: Nurse Practitioner

## 2019-04-23 DIAGNOSIS — E559 Vitamin D deficiency, unspecified: Secondary | ICD-10-CM | POA: Diagnosis not present

## 2019-04-23 DIAGNOSIS — E782 Mixed hyperlipidemia: Secondary | ICD-10-CM | POA: Diagnosis not present

## 2019-04-23 DIAGNOSIS — I10 Essential (primary) hypertension: Secondary | ICD-10-CM | POA: Diagnosis not present

## 2019-04-23 DIAGNOSIS — Z0001 Encounter for general adult medical examination with abnormal findings: Secondary | ICD-10-CM | POA: Diagnosis not present

## 2019-04-24 LAB — CBC
Hematocrit: 39.1 % (ref 34.0–46.6)
Hemoglobin: 12.7 g/dL (ref 11.1–15.9)
MCH: 30.2 pg (ref 26.6–33.0)
MCHC: 32.5 g/dL (ref 31.5–35.7)
MCV: 93 fL (ref 79–97)
Platelets: 163 10*3/uL (ref 150–450)
RBC: 4.2 x10E6/uL (ref 3.77–5.28)
RDW: 13.1 % (ref 11.7–15.4)
WBC: 7.4 10*3/uL (ref 3.4–10.8)

## 2019-04-24 LAB — COMPREHENSIVE METABOLIC PANEL
ALT: 9 IU/L (ref 0–32)
AST: 13 IU/L (ref 0–40)
Albumin/Globulin Ratio: 1.4 (ref 1.2–2.2)
Albumin: 4.4 g/dL (ref 3.7–4.7)
Alkaline Phosphatase: 70 IU/L (ref 39–117)
BUN/Creatinine Ratio: 17 (ref 12–28)
BUN: 17 mg/dL (ref 8–27)
Bilirubin Total: 0.4 mg/dL (ref 0.0–1.2)
CO2: 25 mmol/L (ref 20–29)
Calcium: 9.9 mg/dL (ref 8.7–10.3)
Chloride: 105 mmol/L (ref 96–106)
Creatinine, Ser: 1.03 mg/dL — ABNORMAL HIGH (ref 0.57–1.00)
GFR calc Af Amer: 61 mL/min/{1.73_m2} (ref >59)
GFR calc non Af Amer: 53 mL/min/{1.73_m2} — ABNORMAL LOW (ref >59)
Globulin, Total: 3.2 g/dL (ref 1.5–4.5)
Glucose: 96 mg/dL (ref 65–99)
Potassium: 4.5 mmol/L (ref 3.5–5.2)
Sodium: 143 mmol/L (ref 134–144)
Total Protein: 7.6 g/dL (ref 6.0–8.5)

## 2019-04-24 LAB — LIPID PANEL W/O CHOL/HDL RATIO
Cholesterol, Total: 161 mg/dL (ref 100–199)
HDL: 55 mg/dL (ref >39)
LDL Chol Calc (NIH): 85 mg/dL (ref 0–99)
Triglycerides: 121 mg/dL (ref 0–149)
VLDL Cholesterol Cal: 21 mg/dL (ref 5–40)

## 2019-04-24 LAB — T4, FREE: Free T4: 1.1 ng/dL (ref 0.82–1.77)

## 2019-04-24 LAB — TSH: TSH: 0.705 u[IU]/mL (ref 0.450–4.500)

## 2019-04-24 LAB — VITAMIN D 25 HYDROXY (VIT D DEFICIENCY, FRACTURES): Vit D, 25-Hydroxy: 51.8 ng/mL (ref 30.0–100.0)

## 2019-04-29 NOTE — Progress Notes (Signed)
Discuss with patient at visit 12/22

## 2019-05-02 ENCOUNTER — Telehealth: Payer: Self-pay

## 2019-05-02 NOTE — Telephone Encounter (Signed)
Confirmed and screened for 05-06-19 ov.

## 2019-05-06 ENCOUNTER — Encounter: Payer: Self-pay | Admitting: Nurse Practitioner

## 2019-05-06 ENCOUNTER — Other Ambulatory Visit: Payer: Self-pay

## 2019-05-06 ENCOUNTER — Ambulatory Visit (INDEPENDENT_AMBULATORY_CARE_PROVIDER_SITE_OTHER): Payer: Medicare Other | Admitting: Nurse Practitioner

## 2019-05-06 VITALS — BP 142/88 | HR 68 | Resp 16 | Ht 66.0 in | Wt 195.0 lb

## 2019-05-06 DIAGNOSIS — I480 Paroxysmal atrial fibrillation: Secondary | ICD-10-CM

## 2019-05-06 DIAGNOSIS — R300 Dysuria: Secondary | ICD-10-CM

## 2019-05-06 DIAGNOSIS — I10 Essential (primary) hypertension: Secondary | ICD-10-CM

## 2019-05-06 DIAGNOSIS — R319 Hematuria, unspecified: Secondary | ICD-10-CM | POA: Diagnosis not present

## 2019-05-06 DIAGNOSIS — Z0001 Encounter for general adult medical examination with abnormal findings: Secondary | ICD-10-CM | POA: Diagnosis not present

## 2019-05-06 NOTE — Progress Notes (Signed)
[facility] [place], [place] [address]  Internal MEDICINE  Office Visit Note  Patient Name: Cathy Curtis  [id number]  [id number]  Date of Service: 05/12/2019   Pt is here for routine health maintenance examination  Chief Complaint  Patient presents with  . Medical Management of Chronic Issues  . Annual Exam    mwv   . Hypertension     The patient is here for health maintenance exam.  Blood pressure is moderately elevated. She does keep a blood pressure log per her cardiologist. Blood pressures are generally [id number] systolic and between 70 and 90 diastolic. She feels good. Had blood work dranw earlier this week. Renal functions were a little high. She did have renal ultrasound done showing mild renal cortical thickening, and no other abnormalities.     Current Medication: Outpatient Encounter Medications as of 05/06/2019  Medication Sig  . acetaminophen (TYLENOL) 500 MG tablet Take 500 mg by mouth every 6 (six) hours as needed.  Marland Kitchen apixaban (ELIQUIS) 5 MG TABS tablet Take 5 mg by mouth 2 (two) times daily.  Marland Kitchen atorvastatin (LIPITOR) 10 MG tablet 1 tablet once daily.  . Calcium Citrate-Vitamin D 200-250 MG-UNIT TABS Take 600 mg by mouth.   . celecoxib (CELEBREX) 200 MG capsule Take 1 capsule (200 mg total) by mouth daily.  . Cholecalciferol (VITAMIN D-3) 1000 UNITS CAPS Take by mouth daily.  . Coenzyme Q10 (CO Q-10) 100 MG CAPS Take by mouth daily.  . metoprolol tartrate (LOPRESSOR) 25 MG tablet Take 25 mg by mouth 2 (two) times daily.  Marland Kitchen omeprazole (PRILOSEC) 40 MG capsule Take 1 capsule (40 mg total) by mouth daily.  Marland Kitchen oxybutynin (DITROPAN XL) 15 MG 24 hr tablet Take 1 tablet (15 mg total) by mouth at bedtime.  Marland Kitchen telmisartan (MICARDIS) 80 MG tablet   . zolpidem (AMBIEN) 10 MG tablet Take 1 tablet (10 mg total) by mouth at bedtime as needed for sleep.  . [DISCONTINUED] atorvastatin (LIPITOR) 10 MG tablet 1 tablet once daily.  . [DISCONTINUED]  celecoxib (CELEBREX) 200 MG capsule Take 1 capsule (200 mg total) by mouth daily. (Patient taking differently: Take 200 mg by mouth as needed. )  . [DISCONTINUED] cephALEXin (KEFLEX) 500 MG capsule Take 1 capsule (500 mg total) by mouth 3 (three) times daily.  . [DISCONTINUED] ciprofloxacin (CIPRO) 500 MG tablet Take 1 tablet (500 mg total) by mouth 2 (two) times daily.  . [DISCONTINUED] diclofenac sodium (VOLTAREN) 1 % GEL Apply 4 g topically 2 (two) times daily. (Patient not taking: Reported on 05/06/2019)  . [DISCONTINUED] hydrochlorothiazide (HYDRODIURIL) 12.5 MG tablet Take 1 tablet (12.5 mg total) by mouth daily. (Patient not taking: Reported on 05/06/2019)  . [DISCONTINUED] omeprazole (PRILOSEC) 40 MG capsule Take 1 capsule (40 mg total) by mouth daily.  . [DISCONTINUED] oxybutynin (DITROPAN XL) 15 MG 24 hr tablet Take 1 tablet (15 mg total) by mouth at bedtime.  . [DISCONTINUED] valsartan (DIOVAN) 320 MG tablet Take 1 tablet (320 mg total) by mouth daily. (Patient not taking: Reported on 11/12/2018)  . [DISCONTINUED] zolpidem (AMBIEN) 10 MG tablet Take 1 tablet (10 mg total) by mouth at bedtime as needed for sleep.   No facility-administered encounter medications on file as of 05/06/2019.    Surgical History: Past Surgical History:  Procedure Laterality Date  . ABDOMINAL HYSTERECTOMY    . CARDIOVERSION  2009, 2010  . CHOLECYSTECTOMY    . COLONOSCOPY  2010,02/2014   Dr. Jamal Collin, [facility]  . HEMORRHOID  SURGERY  09-06-2007   stapled  . PACEMAKER INSERTION Left 09/2011  . SALPINGOOPHORECTOMY  1974    Medical History: Past Medical History:  Diagnosis Date  . Atrial fibrillation ([place]) 2016  . Atrial flutter ([place]) 2009  . Breast screening, unspecified   . Diffuse cystic mastopathy    FCD  . Family history of malignant neoplasm of gastrointestinal tract   . Hemorrhoids 2009   resolved  . Hyperlipidemia   . Obesity, unspecified   . Osteoarthritis   . Osteoporosis   . Personal history  of tobacco use, presenting hazards to health   . Screening for obesity   . Sleep apnea    admits to c-pap  . Special screening for malignant neoplasms, colon   . Unspecified essential hypertension 2002    Family History: Family History  Problem Relation Age of Onset  . Colon cancer Father   . Colon cancer Sister   . Colon cancer Sister       Review of Systems  Constitutional: Negative for activity change, chills, fatigue and unexpected weight change.  HENT: Negative for congestion, postnasal drip, rhinorrhea, sneezing and sore throat.   Respiratory: Negative for cough, chest tightness, shortness of breath and wheezing.   Cardiovascular: Negative for chest pain and palpitations.       Blood pressure stable.   Gastrointestinal: Negative for abdominal pain, constipation, diarrhea, nausea and vomiting.  Endocrine: Negative for cold intolerance, heat intolerance, polydipsia and polyuria.  Musculoskeletal: Negative for arthralgias, back pain, joint swelling and neck pain.  Skin: Negative for rash.  Allergic/Immunologic: Negative for environmental allergies.  Neurological: Negative for dizziness, tremors, light-headedness, numbness and headaches.  Hematological: Negative for adenopathy. Does not bruise/bleed easily.  Psychiatric/Behavioral: Negative for behavioral problems (Depression), sleep disturbance and suicidal ideas. The patient is not nervous/anxious.      Today's Vitals   05/06/19 1104  BP: (!) 142/88  Pulse: 68  Resp: 16  SpO2: 98%  Weight: 195 lb (88.5 kg)  Height: 5\' 6"  (1.676 m)   Body mass index is 31.47 kg/m.  Physical Exam Vitals and nursing note reviewed.  Constitutional:      General: She is not in acute distress.    Appearance: Normal appearance. She is well-developed. She is not diaphoretic.  HENT:     Head: Normocephalic and atraumatic.     Mouth/Throat:     Pharynx: No oropharyngeal exudate.  Eyes:     Pupils: Pupils are equal, round, and  reactive to light.  Neck:     Thyroid: No thyromegaly.     Vascular: No JVD.     Trachea: No tracheal deviation.  Cardiovascular:     Rate and Rhythm: Normal rate and regular rhythm.     Pulses: Normal pulses.     Heart sounds: Normal heart sounds. No murmur. No friction rub. No gallop.   Pulmonary:     Effort: Pulmonary effort is normal. No respiratory distress.     Breath sounds: Normal breath sounds. No wheezing or rales.  Chest:     Chest wall: No tenderness.  Abdominal:     General: Bowel sounds are normal.     Palpations: Abdomen is soft.     Tenderness: There is no abdominal tenderness.  Musculoskeletal:        General: Normal range of motion.     Cervical back: Normal range of motion and neck supple.  Lymphadenopathy:     Cervical: No cervical adenopathy.  Skin:    General: Skin  is warm and dry.  Neurological:     General: No focal deficit present.     Mental Status: She is alert and oriented to person, place, and time. Mental status is at baseline.     Cranial Nerves: No cranial nerve deficit.  Psychiatric:        Behavior: Behavior normal.        Thought Content: Thought content normal.        Judgment: Judgment normal.    Depression screen [facility] 2/9 05/06/2019 02/20/2019 11/12/2018 07/15/2018 07/01/2018  Decreased Interest 0 0 0 0 0  Down, Depressed, Hopeless 0 0 0 0 0  PHQ - 2 Score 0 0 0 0 0    Functional Status Survey: Is the patient deaf or have difficulty hearing?: No Does the patient have difficulty seeing, even when wearing glasses/contacts?: No Does the patient have difficulty concentrating, remembering, or making decisions?: No Does the patient have difficulty walking or climbing stairs?: No Does the patient have difficulty dressing or bathing?: No Does the patient have difficulty doing errands alone such as visiting a doctor's office or shopping?: No  MMSE - [place] Exam 05/06/2019 05/03/2018  Orientation to time 5 5  Orientation to Place 5 5   Registration 3 3  Attention/ Calculation 5 5  Recall 3 3  Language- name 2 objects 2 2  Language- repeat 1 1  Language- follow 3 step command 3 3  Language- read & follow direction 1 1  Write a sentence 1 1  Copy design 1 1  Total score 30 30    Fall Risk  05/06/2019 02/20/2019 11/12/2018 10/22/2018 07/15/2018  Falls in the past year? 0 0 1 0 0  Comment - - - - -  Number falls in past yr: 0 - 0 0 -  Injury with Fall? 0 - 0 0 -      LABS: Recent Results (from the past 2160 hour(s))  Comprehensive metabolic panel     Status: Abnormal   Collection Time: 04/23/19 12:01 PM  Result Value Ref Range   Glucose 96 65 - 99 mg/dL   BUN 17 8 - 27 mg/dL   Creatinine, Ser 1.03 (H) 0.57 - 1.00 mg/dL   GFR calc non Af Amer 53 (L) >59 mL/min/1.73   GFR calc Af Amer 61 >59 mL/min/1.73   BUN/Creatinine Ratio 17 12 - 28   Sodium 143 134 - 144 mmol/L   Potassium 4.5 3.5 - 5.2 mmol/L   Chloride 105 96 - 106 mmol/L   CO2 25 20 - 29 mmol/L   Calcium 9.9 8.7 - 10.3 mg/dL   Total Protein 7.6 6.0 - 8.5 g/dL   Albumin 4.4 3.7 - 4.7 g/dL   Globulin, Total 3.2 1.5 - 4.5 g/dL   Albumin/Globulin Ratio 1.4 1.2 - 2.2   Bilirubin Total 0.4 0.0 - 1.2 mg/dL   Alkaline Phosphatase 70 39 - 117 IU/L   AST 13 0 - 40 IU/L   ALT 9 0 - 32 IU/L  CBC     Status: None   Collection Time: 04/23/19 12:01 PM  Result Value Ref Range   WBC 7.4 3.4 - 10.8 x10E3/uL   RBC 4.20 3.77 - 5.28 x10E6/uL   Hemoglobin 12.7 11.1 - 15.9 g/dL   Hematocrit 39.1 34.0 - 46.6 %   MCV 93 79 - 97 fL   MCH 30.2 26.6 - 33.0 pg   MCHC 32.5 31.5 - 35.7 g/dL   RDW 13.1 11.7 - 15.4 %  Platelets 163 150 - 450 x10E3/uL  Lipid Panel w/o Chol/HDL Ratio     Status: None   Collection Time: 04/23/19 12:01 PM  Result Value Ref Range   Cholesterol, Total 161 100 - 199 mg/dL   Triglycerides 121 0 - 149 mg/dL   HDL 55 >39 mg/dL   VLDL Cholesterol Cal 21 5 - 40 mg/dL   LDL Chol Calc (NIH) 85 0 - 99 mg/dL  T4, free     Status: None   Collection  Time: 04/23/19 12:01 PM  Result Value Ref Range   Free T4 1.10 0.82 - 1.77 ng/dL  TSH     Status: None   Collection Time: 04/23/19 12:01 PM  Result Value Ref Range   TSH 0.705 0.450 - 4.500 uIU/mL  VITAMIN D 25 Hydroxy (Vit-D Deficiency, Fractures)     Status: None   Collection Time: 04/23/19 12:01 PM  Result Value Ref Range   Vit D, 25-Hydroxy 51.8 30.0 - 100.0 ng/mL    Comment: Vitamin D deficiency has been defined by the [place] and an Endocrine Society practice guideline as a level of serum 25-OH vitamin D less than 20 ng/mL (1,2). The Endocrine Society went on to further define vitamin D insufficiency as a level between 21 and 29 ng/mL (2). 1. IOM (Institute of Medicine). 2010. Dietary reference    intakes for calcium and D. [place]: The    [employer]. 2. Holick MF, Binkley Bude, Bischoff-Ferrari HA, et al.    Evaluation, treatment, and prevention of vitamin D    deficiency: an Endocrine Society clinical practice    guideline. JCEM. 2011 Jul; 96(7):1911-30.   UA/M w/rflx Culture, Routine     Status: None   Collection Time: 05/06/19 11:00 AM   Specimen: Urine   URINE  Result Value Ref Range   Specific Gravity, UA 1.014 1.005 - 1.030   pH, UA 7.0 5.0 - 7.5   Color, UA Yellow Yellow   Appearance Ur Clear Clear   Leukocytes,UA Negative Negative   Protein,UA Negative Negative/Trace   Glucose, UA Negative Negative   Ketones, UA Negative Negative   RBC, UA Negative Negative   Bilirubin, UA Negative Negative   Urobilinogen, Ur 0.2 0.2 - 1.0 mg/dL   Nitrite, UA Negative Negative   Microscopic Examination Comment     Comment: Microscopic follows if indicated.   Microscopic Examination See below:     Comment: Microscopic was indicated and was performed.   Urinalysis Reflex Comment     Comment: This specimen has reflexed to a Urine Culture.  Microscopic Examination     Status: Abnormal   Collection Time: 05/06/19 11:00 AM   URINE  Result  Value Ref Range   WBC, UA 6-10 (A) 0 - 5 /hpf   RBC 0-2 0 - 2 /hpf   Epithelial Cells (non renal) 0-10 0 - 10 /hpf   Casts None seen None seen /lpf   Mucus, UA Present Not Estab.   Bacteria, UA Few None seen/Few  Urine Culture, Reflex     Status: Abnormal   Collection Time: 05/06/19 11:00 AM   URINE  Result Value Ref Range   Urine Culture, Routine Final report (A)    Organism ID, Bacteria Klebsiella pneumoniae (A)     Comment: Greater than 100,000 colony forming units per mL Cefazolin <=4 ug/mL Cefazolin with an MIC <=16 predicts susceptibility to the oral agents cefaclor, cefdinir, cefpodoxime, cefprozil, cefuroxime, cephalexin, and loracarbef when used for therapy of  uncomplicated urinary tract infections due to E. coli, Klebsiella pneumoniae, and Proteus mirabilis.    Antimicrobial Susceptibility Comment     Comment:       ** S = Susceptible; I = Intermediate; R = Resistant **                    P = Positive; N = Negative             MICS are expressed in micrograms per mL    Antibiotic                 RSLT#1    RSLT#2    RSLT#3    RSLT#4 Amoxicillin/Clavulanic Acid    S Ampicillin                     R Cefepime                       S Ceftriaxone                    S Cefuroxime                     S Ciprofloxacin                  S Ertapenem                      S Gentamicin                     S Imipenem                       S Levofloxacin                   S Meropenem                      S Nitrofurantoin                 S Piperacillin/Tazobactam        S Tetracycline                   S Tobramycin                     S Trimethoprim/Sulfa             S     Assessment/Plan: 1. Encounter for general adult medical examination with abnormal findings Annual health maintenance exam today .  2. Essential hypertension Stable. Continue bp medication as prescribed   3. Paroxysmal atrial fibrillation (HCC) Regular visits with cardiology as scheduled   4. Hematuria,  unspecified type Reviewed renal/bladder ultrasound with patient. Mild cortical thickening. Will continue to monitor   5. Dysuria - UA/M w/rflx Culture, Routine  General Counseling: Adalyn verbalizes understanding of the findings of todays visit and agrees with plan of treatment. I have discussed any further diagnostic evaluation that may be needed or ordered today. We also reviewed her medications today. she has been encouraged to call the office with any questions or concerns that should arise related to todays visit.    Counseling:  Hypertension Counseling:   The following hypertensive lifestyle modification were recommended and discussed:  1. Limiting alcohol intake to less than 1 oz/day of ethanol:(24 oz of beer or 8 oz of wine or 2 oz of 100-proof  whiskey). 2. Take baby ASA 81 mg daily. 3. Importance of regular aerobic exercise and losing weight. 4. Reduce dietary saturated fat and cholesterol intake for overall cardiovascular health. 5. Maintaining adequate dietary potassium, calcium, and magnesium intake. 6. Regular monitoring of the blood pressure. 7. Reduce sodium intake to less than 100 mmol/day (less than 2.3 gm of sodium or less than 6 gm of sodium choride)   This patient was seen by [place] with Dr Lavera Guise as a part of collaborative care agreement  Orders Placed This Encounter  Procedures  . Microscopic Examination  . Urine Culture, Reflex  . UA/M w/rflx Culture, Routine     Time spent: [place], MD  Internal Medicine

## 2019-05-07 NOTE — Progress Notes (Signed)
Waiting on urine culture and senility results.

## 2019-05-10 LAB — URINE CULTURE, REFLEX

## 2019-05-10 LAB — UA/M W/RFLX CULTURE, ROUTINE
Bilirubin, UA: NEGATIVE
Glucose, UA: NEGATIVE
Ketones, UA: NEGATIVE
Leukocytes,UA: NEGATIVE
Nitrite, UA: NEGATIVE
Protein,UA: NEGATIVE
RBC, UA: NEGATIVE
Specific Gravity, UA: 1.014 (ref 1.005–1.030)
Urobilinogen, Ur: 0.2 mg/dL (ref 0.2–1.0)
pH, UA: 7 (ref 5.0–7.5)

## 2019-05-10 LAB — MICROSCOPIC EXAMINATION: Casts: NONE SEEN /lpf

## 2019-05-12 ENCOUNTER — Other Ambulatory Visit: Payer: Self-pay | Admitting: Nurse Practitioner

## 2019-05-12 ENCOUNTER — Telehealth: Payer: Self-pay

## 2019-05-12 DIAGNOSIS — N390 Urinary tract infection, site not specified: Secondary | ICD-10-CM

## 2019-05-12 MED ORDER — SULFAMETHOXAZOLE-TRIMETHOPRIM 800-160 MG PO TABS: 1.0000 | ORAL_TABLET | Freq: Two times a day (BID) | ORAL | 0 refills | Status: DC

## 2019-05-12 NOTE — Telephone Encounter (Signed)
Pt was notified of uti and bactrim bid for 7 days was sent to her pharmacy.

## 2019-05-12 NOTE — Progress Notes (Signed)
Urine sample from physical indicates infection. I have sent new prescription for bactrim DS bid for 7 days to her pharmacy.

## 2019-05-12 NOTE — Telephone Encounter (Signed)
-----   Message from Ronnell Freshwater, NP sent at 05/12/2019 10:15 AM EST ----- Please let the patient know that Urine sample from physical indicates infection. I have sent new prescription for bactrim DS bid for 7 days to her pharmacy. Thanks

## 2019-05-12 NOTE — Progress Notes (Signed)
Please let the patient know that Urine sample from physical indicates infection. I have sent new prescription for bactrim DS bid for 7 days to her pharmacy. Thanks

## 2019-05-20 DIAGNOSIS — Z23 Encounter for immunization: Secondary | ICD-10-CM | POA: Diagnosis not present

## 2019-05-28 ENCOUNTER — Other Ambulatory Visit: Payer: Self-pay | Admitting: Nurse Practitioner

## 2019-05-28 DIAGNOSIS — F5101 Primary insomnia: Secondary | ICD-10-CM

## 2019-05-28 MED ORDER — ZOLPIDEM TARTRATE 10 MG PO TABS: 10.0000 mg | ORAL_TABLET | Freq: Every evening | ORAL | 3 refills | Status: DC | PRN

## 2019-05-28 NOTE — Progress Notes (Signed)
Renewed prescription for zolpidem per pharmacy request.

## 2019-06-17 DIAGNOSIS — Z23 Encounter for immunization: Secondary | ICD-10-CM | POA: Diagnosis not present

## 2019-07-02 DIAGNOSIS — I10 Essential (primary) hypertension: Secondary | ICD-10-CM | POA: Diagnosis not present

## 2019-07-02 DIAGNOSIS — Z950 Presence of cardiac pacemaker: Secondary | ICD-10-CM | POA: Diagnosis not present

## 2019-07-02 DIAGNOSIS — I495 Sick sinus syndrome: Secondary | ICD-10-CM | POA: Diagnosis not present

## 2019-07-02 DIAGNOSIS — E782 Mixed hyperlipidemia: Secondary | ICD-10-CM | POA: Diagnosis not present

## 2019-07-02 DIAGNOSIS — I340 Nonrheumatic mitral (valve) insufficiency: Secondary | ICD-10-CM | POA: Diagnosis not present

## 2019-07-02 DIAGNOSIS — I480 Paroxysmal atrial fibrillation: Secondary | ICD-10-CM | POA: Diagnosis not present

## 2019-07-07 ENCOUNTER — Other Ambulatory Visit: Payer: Self-pay

## 2019-07-07 DIAGNOSIS — K219 Gastro-esophageal reflux disease without esophagitis: Secondary | ICD-10-CM

## 2019-07-07 MED ORDER — OMEPRAZOLE 40 MG PO CPDR: 40.0000 mg | DELAYED_RELEASE_CAPSULE | Freq: Every day | ORAL | 5 refills | Status: DC

## 2019-08-11 ENCOUNTER — Other Ambulatory Visit: Payer: Self-pay

## 2019-08-11 DIAGNOSIS — G8929 Other chronic pain: Secondary | ICD-10-CM

## 2019-08-11 DIAGNOSIS — M25562 Pain in left knee: Secondary | ICD-10-CM

## 2019-08-11 MED ORDER — CELECOXIB 200 MG PO CAPS: 200.0000 mg | ORAL_CAPSULE | Freq: Every day | ORAL | 5 refills | Status: DC

## 2019-08-27 ENCOUNTER — Other Ambulatory Visit: Payer: Self-pay

## 2019-08-27 DIAGNOSIS — K219 Gastro-esophageal reflux disease without esophagitis: Secondary | ICD-10-CM

## 2019-08-27 MED ORDER — OMEPRAZOLE 40 MG PO CPDR: 40.0000 mg | DELAYED_RELEASE_CAPSULE | Freq: Every day | ORAL | 1 refills | Status: DC

## 2019-09-24 ENCOUNTER — Ambulatory Visit (INDEPENDENT_AMBULATORY_CARE_PROVIDER_SITE_OTHER): Payer: Medicare Other

## 2019-09-24 ENCOUNTER — Other Ambulatory Visit: Payer: Self-pay

## 2019-09-24 DIAGNOSIS — G4733 Obstructive sleep apnea (adult) (pediatric): Secondary | ICD-10-CM

## 2019-09-24 NOTE — Progress Notes (Signed)
95 percentile pressure 9   95th percentile leak 0.0    apnea-hypopnea index  0.9 /hr   total days used  >4 hr 86 days  total days used <4 hr 4 days  Total compliance 95.6 percent   She is doing great! No problems or questions at this time

## 2019-09-30 DIAGNOSIS — I495 Sick sinus syndrome: Secondary | ICD-10-CM | POA: Diagnosis not present

## 2019-10-17 DIAGNOSIS — Z1231 Encounter for screening mammogram for malignant neoplasm of breast: Secondary | ICD-10-CM | POA: Diagnosis not present

## 2019-10-28 DIAGNOSIS — N6019 Diffuse cystic mastopathy of unspecified breast: Secondary | ICD-10-CM | POA: Diagnosis not present

## 2019-10-29 ENCOUNTER — Other Ambulatory Visit: Payer: Self-pay | Admitting: General Surgery

## 2019-10-31 ENCOUNTER — Telehealth: Payer: Self-pay

## 2019-10-31 NOTE — Telephone Encounter (Signed)
Confirmed and screened for 11-04-19 ov. °

## 2019-11-04 ENCOUNTER — Ambulatory Visit (INDEPENDENT_AMBULATORY_CARE_PROVIDER_SITE_OTHER): Payer: Medicare Other | Admitting: Nurse Practitioner

## 2019-11-04 ENCOUNTER — Encounter: Payer: Self-pay | Admitting: Nurse Practitioner

## 2019-11-04 ENCOUNTER — Other Ambulatory Visit: Payer: Self-pay

## 2019-11-04 VITALS — BP 162/90 | HR 94 | Temp 97.2°F | Resp 16 | Ht 66.0 in | Wt 197.0 lb

## 2019-11-04 DIAGNOSIS — I10 Essential (primary) hypertension: Secondary | ICD-10-CM

## 2019-11-04 DIAGNOSIS — I480 Paroxysmal atrial fibrillation: Secondary | ICD-10-CM

## 2019-11-04 DIAGNOSIS — Z9989 Dependence on other enabling machines and devices: Secondary | ICD-10-CM

## 2019-11-04 DIAGNOSIS — G4733 Obstructive sleep apnea (adult) (pediatric): Secondary | ICD-10-CM | POA: Diagnosis not present

## 2019-11-04 DIAGNOSIS — N393 Stress incontinence (female) (male): Secondary | ICD-10-CM | POA: Diagnosis not present

## 2019-11-04 NOTE — Progress Notes (Signed)
[facility] [place], [place] [address]  Internal MEDICINE  Office Visit Note  Patient Name: Cathy Curtis  [id number]  [id number]  Date of Service: 11/12/2019  Chief Complaint  Patient presents with  . Follow-up  . Hyperlipidemia  . Hypertension    The patient is here for routine follow up. Blood pressure is moderately elevated. She does see cardiology for blood pressure management. She monitors her blood pressure closely at home. Checks it daily. Systolic blood pressure is generally between 130 and 295 with diastolic pressure between 70 and 90.  No changes have been made to her medication regimen recently.  She does state that she has been getting more fatigued recently. She states that this has been occurring since she has been able to get out and do more activity after being closed in for such a long time due to COVID 19 pandemic.  She had her mammogram 10/17/2019 and it was benign. She is scheduled to have screening colonoscopy July 06/2019 per Dr. Fleet Contras. She does have family history of colon cancer.       Current Medication: Outpatient Encounter Medications as of 11/04/2019  Medication Sig  . acetaminophen (TYLENOL) 500 MG tablet Take 500 mg by mouth every 6 (six) hours as needed.  Marland Kitchen apixaban (ELIQUIS) 5 MG TABS tablet Take 5 mg by mouth 2 (two) times daily.  Marland Kitchen atorvastatin (LIPITOR) 10 MG tablet 1 tablet once daily.  . Calcium Citrate-Vitamin D 200-250 MG-UNIT TABS Take 600 mg by mouth.   . celecoxib (CELEBREX) 200 MG capsule Take 1 capsule (200 mg total) by mouth daily.  . Cholecalciferol (VITAMIN D-3) 1000 UNITS CAPS Take by mouth daily.  . Coenzyme Q10 (CO Q-10) 100 MG CAPS Take 200 mg by mouth daily.   . metoprolol tartrate (LOPRESSOR) 25 MG tablet Take 25 mg by mouth 2 (two) times daily.  Marland Kitchen omeprazole (PRILOSEC) 40 MG capsule Take 1 capsule (40 mg total) by mouth daily.  Marland Kitchen oxybutynin (DITROPAN XL) 15 MG 24 hr tablet Take 1 tablet (15 mg total)  by mouth at bedtime.  . sulfamethoxazole-trimethoprim (BACTRIM DS) 800-160 MG tablet Take 1 tablet by mouth 2 (two) times daily.  Marland Kitchen telmisartan (MICARDIS) 80 MG tablet   . zolpidem (AMBIEN) 10 MG tablet Take 1 tablet (10 mg total) by mouth at bedtime as needed for sleep.   No facility-administered encounter medications on file as of 11/04/2019.    Surgical History: Past Surgical History:  Procedure Laterality Date  . ABDOMINAL HYSTERECTOMY    . CARDIOVERSION  2009, 2010  . CHOLECYSTECTOMY    . COLONOSCOPY  2010,02/2014   Dr. Jamal Collin, [facility]  . HEMORRHOID SURGERY  09-06-2007   stapled  . PACEMAKER INSERTION Left 09/2011  . SALPINGOOPHORECTOMY  1974    Medical History: Past Medical History:  Diagnosis Date  . Atrial fibrillation ([place]) 2016  . Atrial flutter ([place]) 2009  . Breast screening, unspecified   . Diffuse cystic mastopathy    FCD  . Family history of malignant neoplasm of gastrointestinal tract   . Hemorrhoids 2009   resolved  . Hyperlipidemia   . Obesity, unspecified   . Osteoarthritis   . Osteoporosis   . Personal history of tobacco use, presenting hazards to health   . Screening for obesity   . Sleep apnea    admits to c-pap  . Special screening for malignant neoplasms, colon   . Unspecified essential hypertension 2002    Family History: Family History  Problem Relation Age of Onset  . Colon cancer Father   . Colon cancer Sister   . Colon cancer Sister     Social History   Socioeconomic History  . Marital status: Married    Spouse name: Not on file  . Number of children: Not on file  . Years of education: Not on file  . Highest education level: Not on file  Occupational History  . Not on file  Tobacco Use  . Smoking status: Former Smoker    Packs/day: 1.00    Years: 20.00    Pack years: 20.00    Types: Cigarettes    Quit date: 05/16/1991    Years since quitting: 28.5  . Smokeless tobacco: Never Used  Vaping Use  . Vaping Use: Never used   Substance and Sexual Activity  . Alcohol use: Yes    Comment: rarely  . Drug use: No  . Sexual activity: Not on file  Other Topics Concern  . Not on file  Social History Narrative  . Not on file   Social Determinants of Health   Financial Resource Strain:   . Difficulty of Paying Living Expenses:   Food Insecurity:   . Worried About Charity fundraiser in the Last Year:   . Arboriculturist in the Last Year:   Transportation Needs:   . Film/video editor (Medical):   Marland Kitchen Lack of Transportation (Non-Medical):   Physical Activity:   . Days of Exercise per Week:   . Minutes of Exercise per Session:   Stress:   . Feeling of Stress :   Social Connections:   . Frequency of Communication with Friends and Family:   . Frequency of Social Gatherings with Friends and Family:   . Attends Religious Services:   . Active Member of Clubs or Organizations:   . Attends Archivist Meetings:   Marland Kitchen Marital Status:   Intimate Partner Violence:   . Fear of Current or Ex-Partner:   . Emotionally Abused:   Marland Kitchen Physically Abused:   . Sexually Abused:       Review of Systems  Constitutional: Negative for activity change, chills, fatigue and unexpected weight change.  HENT: Negative for congestion, postnasal drip, rhinorrhea, sneezing and sore throat.   Respiratory: Negative for cough, chest tightness, shortness of breath and wheezing.   Cardiovascular: Negative for chest pain and palpitations.       Blood pressure mildly elevated today, but generally stable. Sees cardiology.   Gastrointestinal: Negative for abdominal pain, constipation, diarrhea, nausea and vomiting.  Endocrine: Negative for cold intolerance, heat intolerance, polydipsia and polyuria.  Musculoskeletal: Negative for arthralgias, back pain, joint swelling and neck pain.  Skin: Negative for rash.  Allergic/Immunologic: Negative for environmental allergies.  Neurological: Negative for dizziness, tremors,  light-headedness, numbness and headaches.  Hematological: Negative for adenopathy. Does not bruise/bleed easily.  Psychiatric/Behavioral: Negative for behavioral problems (Depression), sleep disturbance and suicidal ideas. The patient is not nervous/anxious.    Today's Vitals   11/04/19 1021  BP: (!) 162/90  Pulse: 94  Resp: 16  Temp: (!) 97.2 F (36.2 C)  SpO2: 94%  Weight: 197 lb (89.4 kg)  Height: 5\' 6"  (1.676 m)   Body mass index is 31.8 kg/m.   Physical Exam Vitals and nursing note reviewed.  Constitutional:      General: She is not in acute distress.    Appearance: Normal appearance. She is well-developed. She is not diaphoretic.  HENT:  Head: Normocephalic and atraumatic.     Mouth/Throat:     Pharynx: No oropharyngeal exudate.  Eyes:     Pupils: Pupils are equal, round, and reactive to light.  Neck:     Thyroid: No thyromegaly.     Vascular: No JVD.     Trachea: No tracheal deviation.  Cardiovascular:     Rate and Rhythm: Normal rate and regular rhythm.     Pulses: Normal pulses.     Heart sounds: Normal heart sounds. No murmur heard.  No friction rub. No gallop.   Pulmonary:     Effort: No respiratory distress.     Breath sounds: Normal breath sounds. No wheezing or rales.  Chest:     Chest wall: No tenderness.  Abdominal:     General: Bowel sounds are normal.     Palpations: Abdomen is soft.     Tenderness: There is no abdominal tenderness.  Musculoskeletal:        General: Normal range of motion.     Cervical back: Normal range of motion and neck supple.  Lymphadenopathy:     Cervical: No cervical adenopathy.  Skin:    General: Skin is warm and dry.  Neurological:     General: No focal deficit present.     Mental Status: She is alert and oriented to person, place, and time. Mental status is at baseline.     Cranial Nerves: No cranial nerve deficit.  Psychiatric:        Behavior: Behavior normal.        Thought Content: Thought content normal.         Judgment: Judgment normal.    Assessment/Plan: 1. Essential hypertension bp slightly elevated today. No changes made to medications. Continue regular visits with cardiology for management.   2. Paroxysmal atrial fibrillation ([place]) Continue regular visits with cardiology for management.   3. Stress incontinence (female) (female) Continue oxybutynin as prescribed   4. OSA on CPAP Regular visits with Dr. Devona Konig for CPAP management.   General Counseling: gracielynn birkel understanding of the findings of todays visit and agrees with plan of treatment. I have discussed any further diagnostic evaluation that may be needed or ordered today. We also reviewed her medications today. she has been encouraged to call the office with any questions or concerns that should arise related to todays visit.  Hypertension Counseling:   The following hypertensive lifestyle modification were recommended and discussed:  1. Limiting alcohol intake to less than 1 oz/day of ethanol:(24 oz of beer or 8 oz of wine or 2 oz of 100-proof whiskey). 2. Take baby ASA 81 mg daily. 3. Importance of regular aerobic exercise and losing weight. 4. Reduce dietary saturated fat and cholesterol intake for overall cardiovascular health. 5. Maintaining adequate dietary potassium, calcium, and magnesium intake. 6. Regular monitoring of the blood pressure. 7. Reduce sodium intake to less than 100 mmol/day (less than 2.3 gm of sodium or less than 6 gm of sodium choride)   This patient was seen by [place] with Dr Lavera Guise as a part of collaborative care agreement  Total time spent: 25 Minutes   Time spent includes review of chart, medications, test results, and follow up plan with the patient.      Dr Lavera Guise Internal medicine

## 2019-11-12 ENCOUNTER — Other Ambulatory Visit
Admission: RE | Admit: 2019-11-12 | Discharge: 2019-11-12 | Disposition: A | Discharge status: Home / Self Care | Payer: Medicare Other | Source: Ambulatory Visit | Attending: General Surgery | Admitting: General Surgery

## 2019-11-12 ENCOUNTER — Other Ambulatory Visit: Payer: Self-pay

## 2019-11-12 DIAGNOSIS — Z01812 Encounter for preprocedural laboratory examination: Secondary | ICD-10-CM | POA: Diagnosis present

## 2019-11-12 DIAGNOSIS — Z20822 Contact with and (suspected) exposure to covid-19: Secondary | ICD-10-CM | POA: Insufficient documentation

## 2019-11-12 LAB — SARS CORONAVIRUS 2 (TAT 6-24 HRS): SARS Coronavirus 2: NEGATIVE

## 2019-11-13 ENCOUNTER — Encounter: Payer: Self-pay | Admitting: General Surgery

## 2019-11-14 ENCOUNTER — Encounter: Payer: Self-pay | Admitting: General Surgery

## 2019-11-14 ENCOUNTER — Ambulatory Visit: Payer: Medicare Other | Admitting: Anesthesiology

## 2019-11-14 ENCOUNTER — Other Ambulatory Visit: Payer: Self-pay

## 2019-11-14 ENCOUNTER — Encounter
Admission: RE | Disposition: A | Discharge status: Home / Self Care | Payer: Self-pay | Source: Home / Self Care | Attending: General Surgery

## 2019-11-14 ENCOUNTER — Ambulatory Visit
Admission: RE | Admit: 2019-11-14 | Discharge: 2019-11-14 | Disposition: A | Discharge status: Home / Self Care | Payer: Medicare Other | Attending: General Surgery | Admitting: General Surgery

## 2019-11-14 DIAGNOSIS — M1990 Unspecified osteoarthritis, unspecified site: Secondary | ICD-10-CM | POA: Diagnosis not present

## 2019-11-14 DIAGNOSIS — Z6830 Body mass index (BMI) 30.0-30.9, adult: Secondary | ICD-10-CM | POA: Insufficient documentation

## 2019-11-14 DIAGNOSIS — Z7901 Long term (current) use of anticoagulants: Secondary | ICD-10-CM | POA: Diagnosis not present

## 2019-11-14 DIAGNOSIS — K922 Gastrointestinal hemorrhage, unspecified: Secondary | ICD-10-CM | POA: Insufficient documentation

## 2019-11-14 DIAGNOSIS — Z79899 Other long term (current) drug therapy: Secondary | ICD-10-CM | POA: Diagnosis not present

## 2019-11-14 DIAGNOSIS — I4891 Unspecified atrial fibrillation: Secondary | ICD-10-CM | POA: Insufficient documentation

## 2019-11-14 DIAGNOSIS — E669 Obesity, unspecified: Secondary | ICD-10-CM | POA: Insufficient documentation

## 2019-11-14 DIAGNOSIS — M810 Age-related osteoporosis without current pathological fracture: Secondary | ICD-10-CM | POA: Diagnosis not present

## 2019-11-14 DIAGNOSIS — E785 Hyperlipidemia, unspecified: Secondary | ICD-10-CM | POA: Insufficient documentation

## 2019-11-14 DIAGNOSIS — K621 Rectal polyp: Secondary | ICD-10-CM | POA: Diagnosis not present

## 2019-11-14 DIAGNOSIS — G4730 Sleep apnea, unspecified: Secondary | ICD-10-CM | POA: Insufficient documentation

## 2019-11-14 DIAGNOSIS — I10 Essential (primary) hypertension: Secondary | ICD-10-CM | POA: Insufficient documentation

## 2019-11-14 DIAGNOSIS — K6389 Other specified diseases of intestine: Secondary | ICD-10-CM | POA: Diagnosis not present

## 2019-11-14 DIAGNOSIS — Z87891 Personal history of nicotine dependence: Secondary | ICD-10-CM | POA: Insufficient documentation

## 2019-11-14 DIAGNOSIS — K529 Noninfective gastroenteritis and colitis, unspecified: Secondary | ICD-10-CM | POA: Diagnosis not present

## 2019-11-14 DIAGNOSIS — K219 Gastro-esophageal reflux disease without esophagitis: Secondary | ICD-10-CM | POA: Diagnosis not present

## 2019-11-14 DIAGNOSIS — Z800 Family history of malignant neoplasm of digestive organs: Secondary | ICD-10-CM | POA: Insufficient documentation

## 2019-11-14 DIAGNOSIS — K635 Polyp of colon: Secondary | ICD-10-CM | POA: Diagnosis not present

## 2019-11-14 DIAGNOSIS — E782 Mixed hyperlipidemia: Secondary | ICD-10-CM | POA: Diagnosis not present

## 2019-11-14 DIAGNOSIS — Z1211 Encounter for screening for malignant neoplasm of colon: Secondary | ICD-10-CM | POA: Insufficient documentation

## 2019-11-14 DIAGNOSIS — G4733 Obstructive sleep apnea (adult) (pediatric): Secondary | ICD-10-CM | POA: Diagnosis not present

## 2019-11-14 HISTORY — DX: Heart disease, unspecified: I51.9

## 2019-11-14 HISTORY — DX: Diaphragmatic hernia without obstruction or gangrene: K44.9

## 2019-11-14 HISTORY — PX: COLONOSCOPY WITH PROPOFOL: SHX5780

## 2019-11-14 SURGERY — COLONOSCOPY WITH PROPOFOL
Anesthesia: General

## 2019-11-14 MED ORDER — APIXABAN 5 MG PO TABS: 5.0000 mg | ORAL_TABLET | Freq: Two times a day (BID) | ORAL | 0 refills | Status: DC

## 2019-11-14 MED ORDER — LIDOCAINE HCL (CARDIAC) PF 100 MG/5ML IV SOSY
PREFILLED_SYRINGE | INTRAVENOUS | Status: DC | PRN
  Administered 2019-11-14: 100 mg via INTRAVENOUS

## 2019-11-14 MED ORDER — PROPOFOL 500 MG/50ML IV EMUL
INTRAVENOUS | Status: DC | PRN
  Administered 2019-11-14: 120 ug/kg/min via INTRAVENOUS

## 2019-11-14 MED ORDER — PROPOFOL 10 MG/ML IV BOLUS
INTRAVENOUS | Status: DC | PRN
  Administered 2019-11-14: 60 mg via INTRAVENOUS

## 2019-11-14 MED ORDER — SODIUM CHLORIDE 0.9 % IV SOLN: INTRAVENOUS | Status: DC

## 2019-11-14 NOTE — Anesthesia Preprocedure Evaluation (Addendum)
Anesthesia Evaluation  Patient identified by MRN, date of birth, ID band Patient awake    Reviewed: Allergy & Precautions, H&P , NPO status , reviewed documented beta blocker date and time   Airway Mallampati: II  TM Distance: >3 FB Neck ROM: full    Dental  (+) Teeth Intact, Chipped   Pulmonary sleep apnea and Continuous Positive Airway Pressure Ventilation , former smoker,    Pulmonary exam normal        Cardiovascular hypertension, Normal cardiovascular exam     Neuro/Psych    GI/Hepatic hiatal hernia, GERD  Controlled,  Endo/Other    Renal/GU      Musculoskeletal  (+) Arthritis ,   Abdominal   Peds  Hematology   Anesthesia Other Findings Past Medical History: 2016: Atrial fibrillation ([place]) 2009: Atrial flutter ([facility]) No date: Breast screening, unspecified No date: Diffuse cystic mastopathy     Comment:  FCD No date: Family history of malignant neoplasm of gastrointestinal  tract No date: Heart disease 2009: Hemorrhoids     Comment:  resolved No date: Hiatal hernia No date: Hyperlipidemia No date: Obesity, unspecified No date: Osteoarthritis No date: Osteoporosis No date: Personal history of tobacco use, presenting hazards to health No date: Screening for obesity No date: Sleep apnea     Comment:  admits to c-pap No date: Special screening for malignant neoplasms, colon 2002: Unspecified essential hypertension  Past Surgical History: No date: ABDOMINAL HYSTERECTOMY 2009, 2010: CARDIOVERSION No date: CHOLECYSTECTOMY 2010,02/2014: COLONOSCOPY     Comment:  Dr. Jamal Collin, [facility] 09-06-2007: HEMORRHOID SURGERY     Comment:  stapled No date: INSERT / REPLACE / REMOVE PACEMAKER 09/2011: PACEMAKER INSERTION; Left 1974: SALPINGOOPHORECTOMY  BMI    Body Mass Index: 30.67 kg/m      Reproductive/Obstetrics                            Anesthesia Physical Anesthesia Plan  ASA:  III  Anesthesia Plan: General   Post-op Pain Management:    Induction: Intravenous  PONV Risk Score and Plan: 2 and Treatment may vary due to age or medical condition and TIVA  Airway Management Planned: Nasal Cannula and Natural Airway  Additional Equipment:   Intra-op Plan:   Post-operative Plan:   Informed Consent: I have reviewed the patients History and Physical, chart, labs and discussed the procedure including the risks, benefits and alternatives for the proposed anesthesia with the patient or authorized representative who has indicated his/her understanding and acceptance.     Dental Advisory Given  Plan Discussed with: CRNA  Anesthesia Plan Comments:         Anesthesia Quick Evaluation

## 2019-11-14 NOTE — Op Note (Signed)
[facility] Gastroenterology Patient Name: Cathy Curtis Procedure Date: 11/14/2019 8:16 AM MRN: [id number] Account #: [id number] Date of Birth: 1940-06-03 Admit Type: Outpatient Age: 79 Room: [facility] ENDO ROOM 1 Gender: Female Note Status: Finalized Procedure:             Colonoscopy Indications:           Screening for colorectal malignant neoplasm, Screening                         in patient at increased risk: Family history of                         1st-degree relative with colorectal cancer Providers:             Robert Bellow, MD Medicines:             Monitored Anesthesia Care Complications:         No immediate complications. Procedure:             Pre-Anesthesia Assessment:                        - Prior to the procedure, a History and Physical was                         performed, and patient medications, allergies and                         sensitivities were reviewed. The patient's tolerance                         of previous anesthesia was reviewed.                        - The risks and benefits of the procedure and the                         sedation options and risks were discussed with the                         patient. All questions were answered and informed                         consent was obtained.                        After obtaining informed consent, the colonoscope was                         passed under direct vision. Throughout the procedure,                         the patient's blood pressure, pulse, and oxygen                         saturations were monitored continuously. The                         Colonoscope was introduced through the anus and  advanced to the the cecum, identified by appendiceal                         orifice and ileocecal valve. The colonoscopy was                         performed without difficulty. The patient tolerated                         the procedure well. The  quality of the bowel                         preparation was excellent. Findings:      A scattered area of moderately erythematous and hemorrhagic mucosa was       found at the hepatic flexure, in the ascending colon and in the cecum.       Biopsies were taken with a cold forceps for histology.      The retroflexed view of the distal rectum and anal verge was normal and       showed no anal or rectal abnormalities. Biopsies of the transverse       colon, descending colon and rectum were obtained. Impression:            - Erythematous and hemorrhagic mucosa at the hepatic                         flexure, in the ascending colon and in the cecum.                         Biopsied.                        - The distal rectum and anal verge are normal on                         retroflexion view. Recommendation:        - Telephone endoscopist for pathology results in 1                         week.                        - Restart Eliquis on November 16, 2019. Procedure Code(s):     --- Professional ---                        [phone], Colonoscopy, flexible; with biopsy, single or                         multiple Diagnosis Code(s):     --- Professional ---                        Z80.0, Family history of malignant neoplasm of                         digestive organs                        Z12.11, Encounter for screening for malignant neoplasm  of colon                        K63.89, Other specified diseases of intestine                        K92.2, Gastrointestinal hemorrhage, unspecified CPT copyright 2019 American Medical Association. All rights reserved. The codes documented in this report are preliminary and upon coder review may  be revised to meet current compliance requirements. Robert Bellow, MD 11/14/2019 9:05:37 AM This report has been signed electronically. Number of Addenda: 0 Note Initiated On: 11/14/2019 8:16 AM Scope Withdrawal Time: 0 hours 16 minutes 33  seconds  Total Procedure Duration: 0 hours 27 minutes 43 seconds       [facility]

## 2019-11-14 NOTE — Transfer of Care (Signed)
Immediate Anesthesia Transfer of Care Note  Patient: Cathy Curtis  Procedure(s) Performed: COLONOSCOPY WITH PROPOFOL (N/A )  Patient Location: PACU and Endoscopy Unit  Anesthesia Type:General  Level of Consciousness: awake  Airway & Oxygen Therapy: Patient Spontanous Breathing  Post-op Assessment: Report given to RN  Post vital signs: stable  Last Vitals:  Vitals Value Taken Time  BP 102/56 11/14/19 0906  Temp    Pulse 60 11/14/19 0906  Resp 17 11/14/19 0906  SpO2 100 % 11/14/19 0906  Vitals shown include unvalidated device data.  Last Pain:  Vitals:   11/14/19 0726  TempSrc: Temporal         Complications: No complications documented.

## 2019-11-14 NOTE — Anesthesia Postprocedure Evaluation (Signed)
Anesthesia Post Note  Patient: Cathy Curtis  Procedure(s) Performed: COLONOSCOPY WITH PROPOFOL (N/A )  Patient location during evaluation: Endoscopy Anesthesia Type: General Level of consciousness: awake and alert Pain management: pain level controlled Vital Signs Assessment: post-procedure vital signs reviewed and stable Respiratory status: spontaneous breathing, nonlabored ventilation and respiratory function stable Cardiovascular status: blood pressure returned to baseline and stable Postop Assessment: no apparent nausea or vomiting Anesthetic complications: no   No complications documented.   Last Vitals:  Vitals:   11/14/19 0924 11/14/19 0934  BP: (!) 180/79 (!) 178/84  Pulse:    Resp:    Temp:    SpO2:      Last Pain:  Vitals:   11/14/19 0934  TempSrc:   PainSc: 0-No pain                 Alphonsus Sias

## 2019-11-14 NOTE — H&P (Signed)
Cathy Curtis [id number] Dec 11, 1940     HPI:  79 year old woman with 2 first degree relatives with colon cancer. For screening exam. Tolerated prep well. Off Eliquis x 2 days as requested.   Medications Prior to Admission  Medication Sig Dispense Refill Last Dose  . atorvastatin (LIPITOR) 10 MG tablet 1 tablet once daily. 90 tablet 1 11/13/2019 at Unknown time  . celecoxib (CELEBREX) 200 MG capsule Take 1 capsule (200 mg total) by mouth daily. 30 capsule 5 11/13/2019 at Unknown time  . metoprolol tartrate (LOPRESSOR) 25 MG tablet Take 25 mg by mouth 2 (two) times daily.   11/14/2019 at Unknown time  . omeprazole (PRILOSEC) 40 MG capsule Take 1 capsule (40 mg total) by mouth daily. 90 capsule 1 11/13/2019 at Unknown time  . oxybutynin (DITROPAN XL) 15 MG 24 hr tablet Take 1 tablet (15 mg total) by mouth at bedtime. 90 tablet 3 11/13/2019 at Unknown time  . telmisartan (MICARDIS) 80 MG tablet    11/14/2019 at Unknown time  . zolpidem (AMBIEN) 10 MG tablet Take 1 tablet (10 mg total) by mouth at bedtime as needed for sleep. 30 tablet 3 11/13/2019 at Unknown time  . acetaminophen (TYLENOL) 500 MG tablet Take 500 mg by mouth every 6 (six) hours as needed.     Marland Kitchen apixaban (ELIQUIS) 5 MG TABS tablet Take 5 mg by mouth 2 (two) times daily.   11/11/2019  . Calcium Citrate-Vitamin D 200-250 MG-UNIT TABS Take 600 mg by mouth.      . Cholecalciferol (VITAMIN D-3) 1000 UNITS CAPS Take by mouth daily.     . Coenzyme Q10 (CO Q-10) 100 MG CAPS Take 200 mg by mouth daily.      Marland Kitchen sulfamethoxazole-trimethoprim (BACTRIM DS) 800-160 MG tablet Take 1 tablet by mouth 2 (two) times daily. (Patient not taking: Reported on 11/14/2019) 14 tablet 0 Not Taking at Unknown time   Allergies  Allergen Reactions  . Prevacid [Lansoprazole] Other (See Comments)    whelps   Past Medical History:  Diagnosis Date  . Atrial fibrillation ([place]) 2016  . Atrial flutter ([place]) 2009  . Breast screening, unspecified   . Diffuse cystic mastopathy     FCD  . Family history of malignant neoplasm of gastrointestinal tract   . Heart disease   . Hemorrhoids 2009   resolved  . Hiatal hernia   . Hyperlipidemia   . Obesity, unspecified   . Osteoarthritis   . Osteoporosis   . Personal history of tobacco use, presenting hazards to health   . Screening for obesity   . Sleep apnea    admits to c-pap  . Special screening for malignant neoplasms, colon   . Unspecified essential hypertension 2002   Past Surgical History:  Procedure Laterality Date  . ABDOMINAL HYSTERECTOMY    . CARDIOVERSION  2009, 2010  . CHOLECYSTECTOMY    . COLONOSCOPY  2010,02/2014   Dr. Jamal Collin, [facility]  . HEMORRHOID SURGERY  09-06-2007   stapled  . INSERT / REPLACE / REMOVE PACEMAKER    . PACEMAKER INSERTION Left 09/2011  . SALPINGOOPHORECTOMY  1974   Social History   Socioeconomic History  . Marital status: Married    Spouse name: Not on file  . Number of children: Not on file  . Years of education: Not on file  . Highest education level: Not on file  Occupational History  . Not on file  Tobacco Use  . Smoking status: Former Smoker  Packs/day: 1.00    Years: 20.00    Pack years: 20.00    Types: Cigarettes    Quit date: 05/16/1991    Years since quitting: 28.5  . Smokeless tobacco: Never Used  Vaping Use  . Vaping Use: Never used  Substance and Sexual Activity  . Alcohol use: Yes    Comment: rarely  . Drug use: No  . Sexual activity: Not on file  Other Topics Concern  . Not on file  Social History Narrative  . Not on file   Social Determinants of Health   Financial Resource Strain:   . Difficulty of Paying Living Expenses:   Food Insecurity:   . Worried About Charity fundraiser in the Last Year:   . Arboriculturist in the Last Year:   Transportation Needs:   . Film/video editor (Medical):   Marland Kitchen Lack of Transportation (Non-Medical):   Physical Activity:   . Days of Exercise per Week:   . Minutes of Exercise per Session:   Stress:   .  Feeling of Stress :   Social Connections:   . Frequency of Communication with Friends and Family:   . Frequency of Social Gatherings with Friends and Family:   . Attends Religious Services:   . Active Member of Clubs or Organizations:   . Attends Archivist Meetings:   Marland Kitchen Marital Status:   Intimate Partner Violence:   . Fear of Current or Ex-Partner:   . Emotionally Abused:   Marland Kitchen Physically Abused:   . Sexually Abused:    Social History   Social History Narrative  . Not on file     ROS: Negative.     PE: HEENT: Negative. Lungs: Clear. Cardio: RR.  Assessment/Plan:  Proceed with planned endoscopy.   Forest Gleason [facility] 11/14/2019

## 2019-11-15 NOTE — Progress Notes (Signed)
Non-identified Voicemail.  No Message Left. 

## 2019-11-18 LAB — SURGICAL PATHOLOGY

## 2019-12-01 ENCOUNTER — Other Ambulatory Visit: Payer: Self-pay

## 2019-12-01 MED ORDER — ATORVASTATIN CALCIUM 10 MG PO TABS: ORAL_TABLET | ORAL | 1 refills | Status: DC

## 2020-01-01 DIAGNOSIS — I480 Paroxysmal atrial fibrillation: Secondary | ICD-10-CM | POA: Diagnosis not present

## 2020-01-01 DIAGNOSIS — I495 Sick sinus syndrome: Secondary | ICD-10-CM | POA: Diagnosis not present

## 2020-01-01 DIAGNOSIS — E782 Mixed hyperlipidemia: Secondary | ICD-10-CM | POA: Diagnosis not present

## 2020-01-01 DIAGNOSIS — I10 Essential (primary) hypertension: Secondary | ICD-10-CM | POA: Diagnosis not present

## 2020-01-12 ENCOUNTER — Other Ambulatory Visit: Payer: Self-pay

## 2020-01-12 DIAGNOSIS — N393 Stress incontinence (female) (male): Secondary | ICD-10-CM

## 2020-01-12 MED ORDER — OXYBUTYNIN CHLORIDE ER 15 MG PO TB24: 15.0000 mg | ORAL_TABLET | Freq: Every day | ORAL | 3 refills | Status: DC

## 2020-01-26 ENCOUNTER — Telehealth: Payer: Self-pay

## 2020-01-26 ENCOUNTER — Other Ambulatory Visit: Payer: Self-pay

## 2020-01-27 ENCOUNTER — Other Ambulatory Visit: Payer: Self-pay

## 2020-01-27 DIAGNOSIS — F5101 Primary insomnia: Secondary | ICD-10-CM

## 2020-01-27 MED ORDER — ZOLPIDEM TARTRATE 10 MG PO TABS: 10.0000 mg | ORAL_TABLET | Freq: Every evening | ORAL | 3 refills | Status: DC | PRN

## 2020-01-27 NOTE — Telephone Encounter (Signed)
Medication refilled and sent to pharmacy.

## 2020-01-27 NOTE — Telephone Encounter (Signed)
desha already send message

## 2020-02-02 DIAGNOSIS — Z23 Encounter for immunization: Secondary | ICD-10-CM | POA: Diagnosis not present

## 2020-02-19 ENCOUNTER — Ambulatory Visit: Payer: Medicare Other | Admitting: Internal Medicine

## 2020-03-02 ENCOUNTER — Other Ambulatory Visit: Payer: Self-pay

## 2020-03-02 DIAGNOSIS — K219 Gastro-esophageal reflux disease without esophagitis: Secondary | ICD-10-CM

## 2020-03-02 MED ORDER — OMEPRAZOLE 40 MG PO CPDR: 40.0000 mg | DELAYED_RELEASE_CAPSULE | Freq: Every day | ORAL | 1 refills | Status: DC

## 2020-03-11 DIAGNOSIS — S8264XA Nondisplaced fracture of lateral malleolus of right fibula, initial encounter for closed fracture: Secondary | ICD-10-CM | POA: Diagnosis not present

## 2020-03-15 ENCOUNTER — Ambulatory Visit: Payer: Medicare Other | Admitting: Internal Medicine

## 2020-03-18 DIAGNOSIS — S8264XA Nondisplaced fracture of lateral malleolus of right fibula, initial encounter for closed fracture: Secondary | ICD-10-CM | POA: Diagnosis not present

## 2020-03-19 DIAGNOSIS — Z23 Encounter for immunization: Secondary | ICD-10-CM | POA: Diagnosis not present

## 2020-03-24 ENCOUNTER — Ambulatory Visit: Payer: Medicare Other

## 2020-03-25 DIAGNOSIS — S8264XA Nondisplaced fracture of lateral malleolus of right fibula, initial encounter for closed fracture: Secondary | ICD-10-CM | POA: Diagnosis not present

## 2020-03-30 ENCOUNTER — Ambulatory Visit: Payer: Medicare Other | Admitting: Internal Medicine

## 2020-03-31 ENCOUNTER — Other Ambulatory Visit: Payer: Self-pay

## 2020-03-31 ENCOUNTER — Ambulatory Visit (INDEPENDENT_AMBULATORY_CARE_PROVIDER_SITE_OTHER): Payer: Medicare Other

## 2020-03-31 DIAGNOSIS — G4733 Obstructive sleep apnea (adult) (pediatric): Secondary | ICD-10-CM | POA: Diagnosis not present

## 2020-03-31 NOTE — Progress Notes (Deleted)
95 percentile pressure 12   95th percentile leak 6   apnea index 0.7 /hr  apnea-hypopnea index  1.9 /hr   total days used  >4 hr 53 days  total days used <4 hr 17 days  Total compliance 39 percent

## 2020-04-05 NOTE — Progress Notes (Signed)
[facility] [place], [place] [address]  Pulmonary Sleep Medicine   Office Visit Note  Patient Name: Cathy Curtis DOB: [birth year] MRN [id number]  Date of Service: 04/06/2020  Complaints/HPI: Patient is here for routine pulmonary follow-up Followed for OSA treated with CPAP Most recent compliance download: 93% compliance, AHI 1.1 Would like to see about getting a new CPAP machine as her current machine is more than 77 years old Feels better sleeping with CPAP, wakes up feeling rested, denies headaches, dryness or congestion Washing machine by hand with warm water and vinegar--changes tubing and mask as directed Hx of atrial fibrillation/pacemaker--rate well controlled, followed by Dr. Clayborn Bigness  ROS  General: (-) fever, (-) chills, (-) night sweats, (-) weakness Skin: (-) rashes, (-) itching,. Eyes: (-) visual changes, (-) redness, (-) itching. Nose and Sinuses: (-) nasal stuffiness or itchiness, (-) postnasal drip, (-) nosebleeds, (-) sinus trouble. Mouth and Throat: (-) sore throat, (-) hoarseness. Neck: (-) swollen glands, (-) enlarged thyroid, (-) neck pain. Respiratory: - cough, (-) bloody sputum, - shortness of breath, - wheezing. Cardiovascular: - ankle swelling, (-) chest pain. Lymphatic: (-) lymph node enlargement. Neurologic: (-) numbness, (-) tingling. Psychiatric: (-) anxiety, (-) depression   Current Medication: Outpatient Encounter Medications as of 04/06/2020  Medication Sig  . acetaminophen (TYLENOL) 500 MG tablet Take 500 mg by mouth every 6 (six) hours as needed.  Marland Kitchen apixaban (ELIQUIS) 5 MG TABS tablet Take 1 tablet (5 mg total) by mouth 2 (two) times daily.  Marland Kitchen atorvastatin (LIPITOR) 10 MG tablet 1 tablet once daily.  . Calcium Citrate-Vitamin D 200-250 MG-UNIT TABS Take 600 mg by mouth.   . celecoxib (CELEBREX) 200 MG capsule Take 1 capsule (200 mg total) by mouth daily.  . Cholecalciferol (VITAMIN D-3) 1000 UNITS CAPS Take by  mouth daily.  . Coenzyme Q10 (CO Q-10) 100 MG CAPS Take 200 mg by mouth daily.   . metoprolol tartrate (LOPRESSOR) 25 MG tablet Take 25 mg by mouth 2 (two) times daily.  Marland Kitchen omeprazole (PRILOSEC) 40 MG capsule Take 1 capsule (40 mg total) by mouth daily.  Marland Kitchen oxybutynin (DITROPAN XL) 15 MG 24 hr tablet Take 1 tablet (15 mg total) by mouth at bedtime.  Marland Kitchen telmisartan (MICARDIS) 80 MG tablet   . zolpidem (AMBIEN) 10 MG tablet Take 1 tablet (10 mg total) by mouth at bedtime as needed for sleep.  . [DISCONTINUED] sulfamethoxazole-trimethoprim (BACTRIM DS) 800-160 MG tablet Take 1 tablet by mouth 2 (two) times daily. (Patient not taking: Reported on 11/14/2019)   No facility-administered encounter medications on file as of 04/06/2020.    Surgical History: Past Surgical History:  Procedure Laterality Date  . ABDOMINAL HYSTERECTOMY    . CARDIOVERSION  2009, 2010  . CHOLECYSTECTOMY    . COLONOSCOPY  2010,02/2014   Dr. Jamal Collin, [facility]  . COLONOSCOPY WITH PROPOFOL N/A 11/14/2019   Procedure: COLONOSCOPY WITH PROPOFOL;  Surgeon: Robert Bellow, MD;  Location: [facility] ENDOSCOPY;  Service: Endoscopy;  Laterality: N/A;  . HEMORRHOID SURGERY  09-06-2007   stapled  . INSERT / REPLACE / REMOVE PACEMAKER    . PACEMAKER INSERTION Left 09/2011  . SALPINGOOPHORECTOMY  1974    Medical History: Past Medical History:  Diagnosis Date  . Atrial fibrillation ([place]) 2016  . Atrial flutter ([place]) 2009  . Breast screening, unspecified   . Diffuse cystic mastopathy    FCD  . Family history of malignant neoplasm of gastrointestinal tract   . Heart disease   .  Hemorrhoids 2009   resolved  . Hiatal hernia   . Hyperlipidemia   . Obesity, unspecified   . Osteoarthritis   . Osteoporosis   . Personal history of tobacco use, presenting hazards to health   . Screening for obesity   . Sleep apnea    admits to c-pap  . Special screening for malignant neoplasms, colon   . Unspecified essential hypertension 2002     Family History: Family History  Problem Relation Age of Onset  . Colon cancer Father   . Colon cancer Sister   . Heart disease Sister   . Colon cancer Sister   . Stroke Mother   . Colon cancer Nephew     Social History: Social History   Socioeconomic History  . Marital status: Married    Spouse name: Not on file  . Number of children: Not on file  . Years of education: Not on file  . Highest education level: Not on file  Occupational History  . Not on file  Tobacco Use  . Smoking status: Former Smoker    Packs/day: 1.00    Years: 20.00    Pack years: 20.00    Types: Cigarettes    Quit date: 05/16/1991    Years since quitting: 28.9  . Smokeless tobacco: Never Used  Vaping Use  . Vaping Use: Never used  Substance and Sexual Activity  . Alcohol use: Yes    Comment: rarely  . Drug use: No  . Sexual activity: Not on file  Other Topics Concern  . Not on file  Social History Narrative  . Not on file   Social Determinants of Health   Financial Resource Strain:   . Difficulty of Paying Living Expenses: Not on file  Food Insecurity:   . Worried About Charity fundraiser in the Last Year: Not on file  . Ran Out of Food in the Last Year: Not on file  Transportation Needs:   . Lack of Transportation (Medical): Not on file  . Lack of Transportation (Non-Medical): Not on file  Physical Activity:   . Days of Exercise per Week: Not on file  . Minutes of Exercise per Session: Not on file  Stress:   . Feeling of Stress : Not on file  Social Connections:   . Frequency of Communication with Friends and Family: Not on file  . Frequency of Social Gatherings with Friends and Family: Not on file  . Attends Religious Services: Not on file  . Active Member of Clubs or Organizations: Not on file  . Attends Archivist Meetings: Not on file  . Marital Status: Not on file  Intimate Partner Violence:   . Fear of Current or Ex-Partner: Not on file  . Emotionally  Abused: Not on file  . Physically Abused: Not on file  . Sexually Abused: Not on file    Vital Signs: Blood pressure (!) 146/64, pulse 76, temperature (!) 97.3 F (36.3 C), resp. rate 16, height 5\' 6"  (1.676 m), weight 190 lb 3.2 oz (86.3 kg), SpO2 98 %.  Examination: General Appearance: The patient is well-developed, well-nourished, and in no distress. Skin: Gross inspection of skin unremarkable. Head: normocephalic, no gross deformities. Eyes: no gross deformities noted. ENT: ears appear grossly normal no exudates. Neck: Supple. No thyromegaly. No LAD. Respiratory: Clear bilaterally, no rhonchi, wheezing or rales noted. Cardiovascular: Normal S1 and S2 without murmur or rub. Extremities: No cyanosis. pulses are equal. Neurologic: Alert and oriented. No involuntary movements.  LABS: No results found for this or any previous visit (from the past 2160 hour(s)).  Radiology: No results found.  Assessment and Plan: Patient Active Problem List   Diagnosis Date Noted  . Family history of colon cancer 10/23/2018  . Hematuria 07/31/2018  . Chronic constipation 07/01/2018  . Urinary tract infection with hematuria 06/23/2018  . Dysuria 06/23/2018  . Encounter for general adult medical examination with abnormal findings 05/05/2018  . Stress incontinence (female) (female) 05/05/2018  . Primary insomnia 05/05/2018  . Paroxysmal atrial fibrillation ([place]) 07/04/2017  . GERD (gastroesophageal reflux disease) 05/01/2017  . Sick sinus syndrome ([place]) 05/01/2017  . Other fatigue 05/01/2017  . Moderate mitral insufficiency 11/02/2016  . Essential hypertension 03/01/2015  . Cardiac pacemaker 03/01/2015  . Hyperlipemia, mixed 03/06/2014  . OSA on CPAP 03/06/2014  . Diffuse cystic mastopathy 08/06/2012  . Family history of malignant neoplasm of gastrointestinal tract 08/06/2012   1. OSA on CPAP Continue with nightly compliance with CPAP, order placed for new machine, will need 30 day  follow-up after set up - For home use only DME continuous positive airway pressure (CPAP)  2. Encounter for counseling on use of CPAP Discussed importance of adequate CPAP use as well as proper care and cleaning techniques of machine and all supplies.  3. Paroxysmal atrial fibrillation (HCC) Well controlled at this time, continue with current management and follow-up with cardiology as directed   General Counseling: I have discussed the findings of the evaluation and examination with Tomi Bamberger.  I have also discussed any further diagnostic evaluation thatmay be needed or ordered today. Elgie verbalizes understanding of the findings of todays visit. We also reviewed her medications today and discussed drug interactions and side effects including but not limited excessive drowsiness and altered mental states. We also discussed that there is always a risk not just to her but also people around her. she has been encouraged to call the office with any questions or concerns that should arise related to todays visit.  Orders Placed This Encounter  Procedures  . For home use only DME continuous positive airway pressure (CPAP)    Keep pressures settings the same as on previous machine    Order Specific Question:   Length of Need    Answer:   Lifetime    Order Specific Question:   Patient has OSA or probable OSA    Answer:   Yes    Order Specific Question:   Settings    Answer:   5-10    Order Specific Question:   CPAP supplies needed    Answer:   Mask, headgear, cushions, filters, heated tubing and water chamber     Time spent: 30 Time spent includes review of chart, medications, test results and follow-up plan with the patient.  I have personally obtained a history, examined the patient, evaluated laboratory and imaging results, formulated the assessment and plan and placed orders.    Allyne Gee, MD [facility] Pulmonary and Critical Care Sleep medicine

## 2020-04-06 ENCOUNTER — Ambulatory Visit (INDEPENDENT_AMBULATORY_CARE_PROVIDER_SITE_OTHER): Payer: Medicare Other | Admitting: Internal Medicine

## 2020-04-06 ENCOUNTER — Encounter: Payer: Self-pay | Admitting: Internal Medicine

## 2020-04-06 ENCOUNTER — Other Ambulatory Visit: Payer: Self-pay

## 2020-04-06 DIAGNOSIS — I480 Paroxysmal atrial fibrillation: Secondary | ICD-10-CM | POA: Diagnosis not present

## 2020-04-06 DIAGNOSIS — Z9989 Dependence on other enabling machines and devices: Secondary | ICD-10-CM

## 2020-04-06 DIAGNOSIS — G4733 Obstructive sleep apnea (adult) (pediatric): Secondary | ICD-10-CM | POA: Diagnosis not present

## 2020-04-06 DIAGNOSIS — Z7189 Other specified counseling: Secondary | ICD-10-CM | POA: Diagnosis not present

## 2020-04-07 NOTE — Progress Notes (Signed)
95 percentile pressure 9   95th percentile leak na   apnea index na /hr  apnea-hypopnea index  1.1 /hr   total days used  >4 hr 168 days  total days used <4 hr 12 days  Total compliance 93.3   percent

## 2020-04-12 ENCOUNTER — Encounter: Payer: Self-pay | Admitting: Internal Medicine

## 2020-04-12 NOTE — Patient Instructions (Signed)

## 2020-04-15 DIAGNOSIS — I480 Paroxysmal atrial fibrillation: Secondary | ICD-10-CM | POA: Diagnosis not present

## 2020-04-15 DIAGNOSIS — I10 Essential (primary) hypertension: Secondary | ICD-10-CM | POA: Diagnosis not present

## 2020-04-15 DIAGNOSIS — S8264XD Nondisplaced fracture of lateral malleolus of right fibula, subsequent encounter for closed fracture with routine healing: Secondary | ICD-10-CM | POA: Diagnosis not present

## 2020-04-15 DIAGNOSIS — I495 Sick sinus syndrome: Secondary | ICD-10-CM | POA: Diagnosis not present

## 2020-04-26 ENCOUNTER — Other Ambulatory Visit: Payer: Self-pay | Admitting: Nurse Practitioner

## 2020-04-26 DIAGNOSIS — Z0001 Encounter for general adult medical examination with abnormal findings: Secondary | ICD-10-CM | POA: Diagnosis not present

## 2020-04-26 DIAGNOSIS — E559 Vitamin D deficiency, unspecified: Secondary | ICD-10-CM | POA: Diagnosis not present

## 2020-04-26 DIAGNOSIS — I10 Essential (primary) hypertension: Secondary | ICD-10-CM | POA: Diagnosis not present

## 2020-04-27 LAB — CBC
Hematocrit: 34.9 % (ref 34.0–46.6)
Hemoglobin: 11.6 g/dL (ref 11.1–15.9)
MCH: 30.3 pg (ref 26.6–33.0)
MCHC: 33.2 g/dL (ref 31.5–35.7)
MCV: 91 fL (ref 79–97)
Platelets: 168 10*3/uL (ref 150–450)
RBC: 3.83 x10E6/uL (ref 3.77–5.28)
RDW: 13.4 % (ref 11.7–15.4)
WBC: 6.6 10*3/uL (ref 3.4–10.8)

## 2020-04-27 LAB — COMPREHENSIVE METABOLIC PANEL
ALT: 9 IU/L (ref 0–32)
AST: 14 IU/L (ref 0–40)
Albumin/Globulin Ratio: 1.4 (ref 1.2–2.2)
Albumin: 4.2 g/dL (ref 3.7–4.7)
Alkaline Phosphatase: 63 IU/L (ref 44–121)
BUN/Creatinine Ratio: 17 (ref 12–28)
BUN: 20 mg/dL (ref 8–27)
Bilirubin Total: 0.4 mg/dL (ref 0.0–1.2)
CO2: 23 mmol/L (ref 20–29)
Calcium: 9.7 mg/dL (ref 8.7–10.3)
Chloride: 105 mmol/L (ref 96–106)
Creatinine, Ser: 1.19 mg/dL — ABNORMAL HIGH (ref 0.57–1.00)
GFR calc Af Amer: 51 mL/min/{1.73_m2} — ABNORMAL LOW (ref >59)
GFR calc non Af Amer: 44 mL/min/{1.73_m2} — ABNORMAL LOW (ref >59)
Globulin, Total: 2.9 g/dL (ref 1.5–4.5)
Glucose: 94 mg/dL (ref 65–99)
Potassium: 4.5 mmol/L (ref 3.5–5.2)
Sodium: 143 mmol/L (ref 134–144)
Total Protein: 7.1 g/dL (ref 6.0–8.5)

## 2020-04-27 LAB — LIPID PANEL WITH LDL/HDL RATIO
Cholesterol, Total: 151 mg/dL (ref 100–199)
HDL: 49 mg/dL (ref >39)
LDL Chol Calc (NIH): 81 mg/dL (ref 0–99)
LDL/HDL Ratio: 1.7 ratio (ref 0.0–3.2)
Triglycerides: 114 mg/dL (ref 0–149)
VLDL Cholesterol Cal: 21 mg/dL (ref 5–40)

## 2020-04-27 LAB — T4, FREE: Free T4: 1.16 ng/dL (ref 0.82–1.77)

## 2020-04-27 LAB — TSH: TSH: 1.3 u[IU]/mL (ref 0.450–4.500)

## 2020-04-27 LAB — VITAMIN D 25 HYDROXY (VIT D DEFICIENCY, FRACTURES): Vit D, 25-Hydroxy: 56 ng/mL (ref 30.0–100.0)

## 2020-05-06 ENCOUNTER — Encounter: Payer: Self-pay | Admitting: Nurse Practitioner

## 2020-05-06 ENCOUNTER — Ambulatory Visit (INDEPENDENT_AMBULATORY_CARE_PROVIDER_SITE_OTHER): Payer: Medicare Other | Admitting: Nurse Practitioner

## 2020-05-06 ENCOUNTER — Other Ambulatory Visit: Payer: Self-pay

## 2020-05-06 VITALS — BP 138/80 | HR 90 | Temp 97.5°F | Resp 16 | Ht 66.0 in | Wt 191.8 lb

## 2020-05-06 DIAGNOSIS — I480 Paroxysmal atrial fibrillation: Secondary | ICD-10-CM | POA: Diagnosis not present

## 2020-05-06 DIAGNOSIS — N393 Stress incontinence (female) (male): Secondary | ICD-10-CM | POA: Diagnosis not present

## 2020-05-06 DIAGNOSIS — I10 Essential (primary) hypertension: Secondary | ICD-10-CM | POA: Diagnosis not present

## 2020-05-06 DIAGNOSIS — Z0001 Encounter for general adult medical examination with abnormal findings: Secondary | ICD-10-CM | POA: Diagnosis not present

## 2020-05-06 DIAGNOSIS — G4733 Obstructive sleep apnea (adult) (pediatric): Secondary | ICD-10-CM | POA: Diagnosis not present

## 2020-05-06 DIAGNOSIS — R300 Dysuria: Secondary | ICD-10-CM

## 2020-05-06 NOTE — Progress Notes (Signed)
[facility] [place], [place] [address]  Internal MEDICINE  Office Visit Note  Patient Name: Cathy Curtis  [id number]  [id number]  Date of Service: 05/06/2020   Pt is here for routine health maintenance examination  Chief Complaint  Patient presents with  . Medicare Wellness  . Hyperlipidemia  . Hypertension     The patient is here for health maintenance exam. -blood pressure slightly elevated in the office today. Does keep log of BP every day systolic pressure running mostly in 120s and 130s. distolic pressure generally running in the 70s.  -denies chest pain, chest pressure, or shortness of breath. -did have a fall in October. Fractured the right fibula. Currently in lower extremity splint.  -mammogram done 10/28/2019 was benign -labs done. Mild elevation of renal functions. Other labs normal.  Has OSA. Currently on CPAP and seen regularly per Dr. Devona Konig for management    Current Medication: Outpatient Encounter Medications as of 05/06/2020  Medication Sig  . acetaminophen (TYLENOL) 500 MG tablet Take 500 mg by mouth every 6 (six) hours as needed.  Marland Kitchen apixaban (ELIQUIS) 5 MG TABS tablet Take 1 tablet (5 mg total) by mouth 2 (two) times daily.  Marland Kitchen atorvastatin (LIPITOR) 10 MG tablet 1 tablet once daily.  . Calcium Citrate-Vitamin D 200-250 MG-UNIT TABS Take 600 mg by mouth.   . celecoxib (CELEBREX) 200 MG capsule Take 1 capsule (200 mg total) by mouth daily.  . Cholecalciferol (VITAMIN D-3) 1000 UNITS CAPS Take by mouth daily.  . Coenzyme Q10 (CO Q-10) 100 MG CAPS Take 200 mg by mouth daily.   . metoprolol tartrate (LOPRESSOR) 25 MG tablet Take 25 mg by mouth 2 (two) times daily.  Marland Kitchen omeprazole (PRILOSEC) 40 MG capsule Take 1 capsule (40 mg total) by mouth daily.  Marland Kitchen oxybutynin (DITROPAN XL) 15 MG 24 hr tablet Take 1 tablet (15 mg total) by mouth at bedtime.  Marland Kitchen telmisartan (MICARDIS) 80 MG tablet   . zolpidem (AMBIEN) 10 MG tablet Take 1 tablet  (10 mg total) by mouth at bedtime as needed for sleep.   No facility-administered encounter medications on file as of 05/06/2020.    Surgical History: Past Surgical History:  Procedure Laterality Date  . ABDOMINAL HYSTERECTOMY    . CARDIOVERSION  2009, 2010  . CHOLECYSTECTOMY    . COLONOSCOPY  2010,02/2014   Dr. Jamal Collin, [facility]  . COLONOSCOPY WITH PROPOFOL N/A 11/14/2019   Procedure: COLONOSCOPY WITH PROPOFOL;  Surgeon: Robert Bellow, MD;  Location: [facility] ENDOSCOPY;  Service: Endoscopy;  Laterality: N/A;  . HEMORRHOID SURGERY  09-06-2007   stapled  . INSERT / REPLACE / REMOVE PACEMAKER    . PACEMAKER INSERTION Left 09/2011  . SALPINGOOPHORECTOMY  1974    Medical History: Past Medical History:  Diagnosis Date  . Atrial fibrillation ([place]) 2016  . Atrial flutter ([place]) 2009  . Breast screening, unspecified   . Diffuse cystic mastopathy    FCD  . Family history of malignant neoplasm of gastrointestinal tract   . Heart disease   . Hemorrhoids 2009   resolved  . Hiatal hernia   . Hyperlipidemia   . Obesity, unspecified   . Osteoarthritis   . Osteoporosis   . Personal history of tobacco use, presenting hazards to health   . Screening for obesity   . Sleep apnea    admits to c-pap  . Special screening for malignant neoplasms, colon   . Unspecified essential hypertension 2002    Family  History: Family History  Problem Relation Age of Onset  . Colon cancer Father   . Colon cancer Sister   . Heart disease Sister   . Colon cancer Sister   . Stroke Mother   . Colon cancer Nephew       Review of Systems  Constitutional: Negative for activity change, chills, fatigue and unexpected weight change.  HENT: Negative for congestion, postnasal drip, rhinorrhea, sneezing and sore throat.   Respiratory: Negative for cough, chest tightness, shortness of breath and wheezing.   Cardiovascular: Negative for chest pain and palpitations.       Blood pressure mildly elevated today, but  generally stable. Sees cardiology.   Gastrointestinal: Negative for abdominal pain, constipation, diarrhea, nausea and vomiting.  Endocrine: Negative for cold intolerance, heat intolerance, polydipsia and polyuria.  Genitourinary: Negative for dysuria, frequency and urgency.  Musculoskeletal: Negative for arthralgias, back pain, joint swelling and neck pain.  Skin: Negative for rash.  Allergic/Immunologic: Negative for environmental allergies.  Neurological: Negative for dizziness, tremors, light-headedness, numbness and headaches.  Hematological: Negative for adenopathy. Does not bruise/bleed easily.  Psychiatric/Behavioral: Negative for behavioral problems (Depression), sleep disturbance and suicidal ideas. The patient is not nervous/anxious.      Today's Vitals   05/06/20 0955  BP: 138/80  Pulse: 90  Resp: 16  Temp: (!) 97.5 F (36.4 C)  SpO2: 96%  Weight: 191 lb 12.8 oz (87 kg)  Height: 5' 6"  (1.676 m)   Body mass index is 30.96 kg/m.  Physical Exam Vitals and nursing note reviewed.  Constitutional:      General: She is not in acute distress.    Appearance: Normal appearance. She is well-developed and well-nourished. She is obese. She is not diaphoretic.  HENT:     Head: Normocephalic and atraumatic.     Mouth/Throat:     Mouth: Oropharynx is clear and moist.     Pharynx: No oropharyngeal exudate.  Eyes:     Extraocular Movements: EOM normal.     Pupils: Pupils are equal, round, and reactive to light.  Neck:     Thyroid: No thyromegaly.     Vascular: No carotid bruit or JVD.     Trachea: No tracheal deviation.  Cardiovascular:     Rate and Rhythm: Normal rate and regular rhythm.     Pulses: Normal pulses.     Heart sounds: Normal heart sounds. No murmur heard. No friction rub. No gallop.   Pulmonary:     Effort: Pulmonary effort is normal. No respiratory distress.     Breath sounds: Normal breath sounds. No wheezing or rales.  Chest:     Chest wall: No  tenderness.  Abdominal:     General: Bowel sounds are normal.     Palpations: Abdomen is soft.     Tenderness: There is no abdominal tenderness.  Musculoskeletal:        General: Normal range of motion.     Cervical back: Normal range of motion and neck supple.  Lymphadenopathy:     Cervical: No cervical adenopathy.  Skin:    General: Skin is warm and dry.     Capillary Refill: Capillary refill takes less than 2 seconds.  Neurological:     General: No focal deficit present.     Mental Status: She is alert and oriented to person, place, and time.     Cranial Nerves: No cranial nerve deficit.  Psychiatric:        Mood and Affect: Mood and affect and  mood normal.        Behavior: Behavior normal.        Thought Content: Thought content normal.        Judgment: Judgment normal.   . Depression screen [facility] 2/9 05/06/2020 11/04/2019 05/06/2019 02/20/2019 11/12/2018  Decreased Interest 0 0 0 0 0  Down, Depressed, Hopeless 0 0 0 0 0  PHQ - 2 Score 0 0 0 0 0    Functional Status Survey: Is the patient deaf or have difficulty hearing?: No Does the patient have difficulty seeing, even when wearing glasses/contacts?: No Does the patient have difficulty concentrating, remembering, or making decisions?: Yes Does the patient have difficulty walking or climbing stairs?: No Does the patient have difficulty dressing or bathing?: No Does the patient have difficulty doing errands alone such as visiting a doctor's office or shopping?: No  MMSE - [place] Exam 05/06/2020 05/06/2019 05/03/2018  Orientation to time 5 5 5   Orientation to Place 5 5 5   Registration 3 3 3   Attention/ Calculation 5 5 5   Recall 3 3 3   Language- name 2 objects 2 2 2   Language- repeat 1 1 1   Language- follow 3 step command 3 3 3   Language- read & follow direction 1 1 1   Write a sentence 1 1 1   Copy design 1 1 1   Total score 30 30 30     Fall Risk  05/06/2020 11/04/2019 05/06/2019 02/20/2019 11/12/2018  Falls in  the past year? 1 0 0 0 1  Comment - - - - -  Number falls in past yr: 0 - 0 - 0  Injury with Fall? 1 - 0 - 0      LABS: Recent Results (from the past 2160 hour(s))  Comprehensive metabolic panel     Status: Abnormal   Collection Time: 04/26/20  9:21 AM  Result Value Ref Range   Glucose 94 65 - 99 mg/dL   BUN 20 8 - 27 mg/dL   Creatinine, Ser 1.19 (H) 0.57 - 1.00 mg/dL   GFR calc non Af Amer 44 (L) >59 mL/min/1.73   GFR calc Af Amer 51 (L) >59 mL/min/1.73    Comment: **In accordance with recommendations from the NKF-ASN Task force,**   [facility] is in the process of updating its eGFR calculation to the   2021 CKD-EPI creatinine equation that estimates kidney function   without a race variable.    BUN/Creatinine Ratio 17 12 - 28   Sodium 143 134 - 144 mmol/L   Potassium 4.5 3.5 - 5.2 mmol/L   Chloride 105 96 - 106 mmol/L   CO2 23 20 - 29 mmol/L   Calcium 9.7 8.7 - 10.3 mg/dL   Total Protein 7.1 6.0 - 8.5 g/dL   Albumin 4.2 3.7 - 4.7 g/dL   Globulin, Total 2.9 1.5 - 4.5 g/dL   Albumin/Globulin Ratio 1.4 1.2 - 2.2   Bilirubin Total 0.4 0.0 - 1.2 mg/dL   Alkaline Phosphatase 63 44 - 121 IU/L    Comment:               **Please note reference interval change**   AST 14 0 - 40 IU/L   ALT 9 0 - 32 IU/L  CBC     Status: None   Collection Time: 04/26/20  9:21 AM  Result Value Ref Range   WBC 6.6 3.4 - 10.8 x10E3/uL   RBC 3.83 3.77 - 5.28 x10E6/uL   Hemoglobin 11.6 11.1 - 15.9 g/dL  Hematocrit 34.9 34.0 - 46.6 %   MCV 91 79 - 97 fL   MCH 30.3 26.6 - 33.0 pg   MCHC 33.2 31.5 - 35.7 g/dL   RDW 13.4 11.7 - 15.4 %   Platelets 168 150 - 450 x10E3/uL  Lipid Panel With LDL/HDL Ratio     Status: None   Collection Time: 04/26/20  9:21 AM  Result Value Ref Range   Cholesterol, Total 151 100 - 199 mg/dL   Triglycerides 114 0 - 149 mg/dL   HDL 49 >39 mg/dL   VLDL Cholesterol Cal 21 5 - 40 mg/dL   LDL Chol Calc (NIH) 81 0 - 99 mg/dL   LDL/HDL Ratio 1.7 0.0 - 3.2 ratio    Comment:                                      LDL/HDL Ratio                                             Men  Women                               1/2 Avg.Risk  1.0    1.5                                   Avg.Risk  3.6    3.2                                2X Avg.Risk  6.2    5.0                                3X Avg.Risk  8.0    6.1   T4, free     Status: None   Collection Time: 04/26/20  9:21 AM  Result Value Ref Range   Free T4 1.16 0.82 - 1.77 ng/dL  TSH     Status: None   Collection Time: 04/26/20  9:21 AM  Result Value Ref Range   TSH 1.300 0.450 - 4.500 uIU/mL  VITAMIN D 25 Hydroxy (Vit-D Deficiency, Fractures)     Status: None   Collection Time: 04/26/20  9:21 AM  Result Value Ref Range   Vit D, 25-Hydroxy 56.0 30.0 - 100.0 ng/mL    Comment: Vitamin D deficiency has been defined by the [place] practice guideline as a level of serum 25-OH vitamin D less than 20 ng/mL (1,2). The Endocrine Society went on to further define vitamin D insufficiency as a level between 21 and 29 ng/mL (2). 1. IOM (Institute of Medicine). 2010. Dietary reference    intakes for calcium and D. [place]: The    [employer]. 2. Holick MF, Binkley Cisco, Bischoff-Ferrari HA, et al.    Evaluation, treatment, and prevention of vitamin D    deficiency: an Endocrine Society clinical practice    guideline. JCEM. 2011 Jul; 96(7):1911-30.     Assessment/Plan: 1. Encounter for general adult medical examination with abnormal findings ammual  health maintenance exam today  2. Essential hypertension Generally stable. Reviewed blood pressure log with patient. No changes made to meds. She should follow up with cardiology as scheduled  3. Paroxysmal atrial fibrillation ([place]) She should follow up with cardiology as scheduled  4. Stress incontinence (female) (female) Continue ditropan as prescribed   5. Obstructive sleep apnea (adult) (pediatric) Continue regular visits  with Dr. Devona Konig for CPAP management.  6. Dysuria - UA/M w/rflx Culture, Routine  General Counseling: Collen verbalizes understanding of the findings of todays visit and agrees with plan of treatment. I have discussed any further diagnostic evaluation that may be needed or ordered today. We also reviewed her medications today. she has been encouraged to call the office with any questions or concerns that should arise related to todays visit.    Counseling:  This patient was seen by Leretha Pol FNP Collaboration with Dr Lavera Guise as a part of collaborative care agreement  Orders Placed This Encounter  Procedures  . UA/M w/rflx Culture, Routine     Total time spent: 30 Minutes  Time spent includes review of chart, medications, test results, and follow up plan with the patient.     Lavera Guise, MD  Internal Medicine

## 2020-05-09 ENCOUNTER — Other Ambulatory Visit: Payer: Self-pay | Admitting: Nurse Practitioner

## 2020-05-09 DIAGNOSIS — N390 Urinary tract infection, site not specified: Secondary | ICD-10-CM

## 2020-05-09 MED ORDER — NITROFURANTOIN MONOHYD MACRO 100 MG PO CAPS: 100.0000 mg | ORAL_CAPSULE | Freq: Two times a day (BID) | ORAL | 0 refills | Status: DC

## 2020-05-09 NOTE — Progress Notes (Signed)
Please let the patient know that urine sample from her physical showed infection. I have added macrobid 100mg  twice daily for 7 days. Sent this to her pharmacy. Thanks.

## 2020-05-09 NOTE — Progress Notes (Signed)
Review with patient at next visit

## 2020-05-10 ENCOUNTER — Telehealth: Payer: Self-pay

## 2020-05-10 NOTE — Telephone Encounter (Signed)
Pt informed of test results and that antibiotic was sent to pharmacy.  Pt asked if she needed to come in for a fup to recheck after finishing antibiotic

## 2020-05-10 NOTE — Telephone Encounter (Signed)
-----   Message from Carlean Jews, NP sent at 05/09/2020  8:52 PM EST ----- Please let the patient know that urine sample from her physical showed infection. I have added macrobid 100mg  twice daily for 7 days. Sent this to her pharmacy. Thanks.

## 2020-05-12 NOTE — Progress Notes (Signed)
Not neccessary since she was asymptomatic. Thanks

## 2020-05-13 DIAGNOSIS — S8264XD Nondisplaced fracture of lateral malleolus of right fibula, subsequent encounter for closed fracture with routine healing: Secondary | ICD-10-CM | POA: Diagnosis not present

## 2020-05-15 LAB — URINE CULTURE, REFLEX

## 2020-05-15 LAB — UA/M W/RFLX CULTURE, ROUTINE
Bilirubin, UA: NEGATIVE
Glucose, UA: NEGATIVE
Ketones, UA: NEGATIVE
Nitrite, UA: POSITIVE — AB
Protein,UA: NEGATIVE
RBC, UA: NEGATIVE
Specific Gravity, UA: 1.012 (ref 1.005–1.030)
Urobilinogen, Ur: 0.2 mg/dL (ref 0.2–1.0)
pH, UA: 7 (ref 5.0–7.5)

## 2020-05-15 LAB — MICROSCOPIC EXAMINATION
Casts: NONE SEEN /lpf
RBC, Urine: NONE SEEN /hpf (ref 0–2)
WBC, UA: >30 /hpf — AB (ref 0–5)

## 2020-05-17 ENCOUNTER — Telehealth: Payer: Self-pay

## 2020-05-17 NOTE — Telephone Encounter (Signed)
Pt informed that we will hold giving another antibiotic for now for her UTI that we treated and see how she does.  Pt informed to drink plenty of fluids.

## 2020-05-17 NOTE — Telephone Encounter (Signed)
Pt is concerned about taking that medication again and maybe it needs to be on her allergy list.

## 2020-05-17 NOTE — Telephone Encounter (Signed)
If she was able to complete five days of the antibiotic, I think that should be ok. She needs to drink plenty of water. Thanks.

## 2020-05-18 NOTE — Telephone Encounter (Signed)
Spoke with pt that she stopped med  and drink plenty of water  and made her appt for Thursday

## 2020-05-20 ENCOUNTER — Ambulatory Visit: Payer: Medicare Other | Admitting: Nurse Practitioner

## 2020-06-01 ENCOUNTER — Other Ambulatory Visit: Payer: Self-pay

## 2020-06-01 DIAGNOSIS — K219 Gastro-esophageal reflux disease without esophagitis: Secondary | ICD-10-CM

## 2020-06-01 MED ORDER — OMEPRAZOLE 40 MG PO CPDR: 40.0000 mg | DELAYED_RELEASE_CAPSULE | Freq: Every day | ORAL | 1 refills | Status: DC

## 2020-06-01 MED ORDER — ATORVASTATIN CALCIUM 10 MG PO TABS: ORAL_TABLET | ORAL | 1 refills | Status: DC

## 2020-08-20 ENCOUNTER — Telehealth: Payer: Self-pay

## 2020-08-20 NOTE — Telephone Encounter (Signed)
Confirmation of verbal order signed by provider and placed in [facility] Patient folder. Loma Sousa

## 2020-08-24 ENCOUNTER — Telehealth: Payer: Self-pay

## 2020-08-24 DIAGNOSIS — F5101 Primary insomnia: Secondary | ICD-10-CM

## 2020-08-24 MED ORDER — ZOLPIDEM TARTRATE 10 MG PO TABS: 10.0000 mg | ORAL_TABLET | Freq: Every evening | ORAL | 1 refills | Status: DC | PRN

## 2020-08-24 NOTE — Telephone Encounter (Signed)
sent 

## 2020-08-30 ENCOUNTER — Other Ambulatory Visit: Payer: Self-pay | Admitting: Internal Medicine

## 2020-08-30 DIAGNOSIS — K219 Gastro-esophageal reflux disease without esophagitis: Secondary | ICD-10-CM

## 2020-09-06 ENCOUNTER — Other Ambulatory Visit: Payer: Self-pay

## 2020-09-06 DIAGNOSIS — M25562 Pain in left knee: Secondary | ICD-10-CM

## 2020-09-06 DIAGNOSIS — G8929 Other chronic pain: Secondary | ICD-10-CM

## 2020-09-06 MED ORDER — CELECOXIB 200 MG PO CAPS: 200.0000 mg | ORAL_CAPSULE | Freq: Every day | ORAL | 5 refills | Status: DC

## 2020-10-06 ENCOUNTER — Ambulatory Visit: Payer: Medicare PPO

## 2020-10-06 ENCOUNTER — Other Ambulatory Visit: Payer: Self-pay

## 2020-10-06 DIAGNOSIS — G4733 Obstructive sleep apnea (adult) (pediatric): Secondary | ICD-10-CM | POA: Diagnosis not present

## 2020-10-06 NOTE — Progress Notes (Signed)
95 percentile pressure 9   95th percentile leak 0.0     apnea-hypopnea index  1.1 /hr   total days used  >4 hr 27 days  total days used <4 hr 3 days  Total compliance 86.7 percent  She is doing great on cpap. No problems or questions at this time

## 2020-10-26 ENCOUNTER — Other Ambulatory Visit: Payer: Self-pay | Admitting: Internal Medicine

## 2020-10-26 DIAGNOSIS — F5101 Primary insomnia: Secondary | ICD-10-CM

## 2020-11-05 ENCOUNTER — Ambulatory Visit: Payer: Medicare PPO | Admitting: Nurse Practitioner

## 2020-11-05 ENCOUNTER — Other Ambulatory Visit: Payer: Self-pay

## 2020-11-05 ENCOUNTER — Encounter: Payer: Self-pay | Admitting: Nurse Practitioner

## 2020-11-05 VITALS — BP 140/80 | HR 72 | Temp 98.4°F | Resp 16 | Ht 66.0 in | Wt 190.2 lb

## 2020-11-05 DIAGNOSIS — F320 Major depressive disorder, single episode, mild: Secondary | ICD-10-CM

## 2020-11-05 DIAGNOSIS — F5101 Primary insomnia: Secondary | ICD-10-CM

## 2020-11-05 DIAGNOSIS — I10 Essential (primary) hypertension: Secondary | ICD-10-CM | POA: Diagnosis not present

## 2020-11-05 DIAGNOSIS — I495 Sick sinus syndrome: Secondary | ICD-10-CM | POA: Diagnosis not present

## 2020-11-05 DIAGNOSIS — Z9989 Dependence on other enabling machines and devices: Secondary | ICD-10-CM

## 2020-11-05 DIAGNOSIS — K219 Gastro-esophageal reflux disease without esophagitis: Secondary | ICD-10-CM

## 2020-11-05 DIAGNOSIS — G4733 Obstructive sleep apnea (adult) (pediatric): Secondary | ICD-10-CM | POA: Diagnosis not present

## 2020-11-05 DIAGNOSIS — E782 Mixed hyperlipidemia: Secondary | ICD-10-CM

## 2020-11-05 NOTE — Progress Notes (Signed)
[name], [place] [address]  Internal MEDICINE  Office Visit Note  Patient Name: Cathy Curtis  [id number]  [id number]  Date of Service: 11/17/2020  Chief Complaint  Patient presents with   Follow-up    Med review   Hypertension   Hyperlipidemia    HPI Cathy Curtis presents for a follow up visit for medication review, hypertension and hyperlipidemia. She has her annual physical scheduled for May 06, 2021. She denies any pain. She denies any current questions, concerns or problems. Her blood pressure is elevated at her visit today. She reports that it was 132/80 this morning at home. She sees Dr. Nehemiah Massed for cardiology. She was started on amlodipine 5 mg this week. She has a history of sick sinus syndrome. She does not need any refills today. She declined the shingles and tetanus vaccine today. She has had 3 doses of the COVID vaccine and has declined the 4th dose at this time.   Current Medication: Outpatient Encounter Medications as of 11/05/2020  Medication Sig   acetaminophen (TYLENOL) 500 MG tablet Take 500 mg by mouth every 6 (six) hours as needed.   amLODipine (NORVASC) 5 MG tablet Take by mouth.   apixaban (ELIQUIS) 5 MG TABS tablet Take 1 tablet (5 mg total) by mouth 2 (two) times daily.   atorvastatin (LIPITOR) 10 MG tablet TAKE 1 TABLET BY MOUTH ONCE DAILY   Calcium Citrate-Vitamin D 200-250 MG-UNIT TABS Take 600 mg by mouth.    celecoxib (CELEBREX) 200 MG capsule Take 1 capsule (200 mg total) by mouth daily.   Cholecalciferol (VITAMIN D-3) 1000 UNITS CAPS Take by mouth daily.   Coenzyme Q10 (CO Q-10) 100 MG CAPS Take 200 mg by mouth daily.    metoprolol tartrate (LOPRESSOR) 25 MG tablet Take 25 mg by mouth 2 (two) times daily.   omeprazole (PRILOSEC) 40 MG capsule TAKE (1) CAPSULE BY MOUTH ONCE DAILY.   oxybutynin (DITROPAN XL) 15 MG 24 hr tablet Take 1 tablet (15 mg total) by mouth at bedtime.   telmisartan (MICARDIS) 80 MG tablet     zolpidem (AMBIEN) 10 MG tablet TAKE 1 TABLET BY MOUTH AT BEDTIME AS NEEDED FOR SLEEP   [DISCONTINUED] nitrofurantoin, macrocrystal-monohydrate, (MACROBID) 100 MG capsule Take 1 capsule (100 mg total) by mouth 2 (two) times daily. (Patient not taking: Reported on 11/05/2020)   No facility-administered encounter medications on file as of 11/05/2020.    Surgical History: Past Surgical History:  Procedure Laterality Date   ABDOMINAL HYSTERECTOMY     CARDIOVERSION  2009, 2010   CHOLECYSTECTOMY     COLONOSCOPY  2010,02/2014   Dr. Jamal Collin, [place] WITH PROPOFOL N/A 11/14/2019   Procedure: COLONOSCOPY WITH PROPOFOL;  Surgeon: Robert Bellow, MD;  Location: [facility] ENDOSCOPY;  Service: Endoscopy;  Laterality: N/A;   HEMORRHOID SURGERY  09-06-2007   stapled   INSERT / REPLACE / REMOVE PACEMAKER     PACEMAKER INSERTION Left 09/2011   SALPINGOOPHORECTOMY  1974    Medical History: Past Medical History:  Diagnosis Date   Atrial fibrillation ([place]) 2016   Atrial flutter ([place]) 2009   Breast screening, unspecified    Diffuse cystic mastopathy    FCD   Family history of malignant neoplasm of gastrointestinal tract    Heart disease    Hemorrhoids 2009   resolved   Hiatal hernia    Hyperlipidemia    Obesity, unspecified    Osteoarthritis    Osteoporosis    Personal  history of tobacco use, presenting hazards to health    Screening for obesity    Sleep apnea    admits to c-pap   Special screening for malignant neoplasms, colon    Unspecified essential hypertension 2002    Family History: Family History  Problem Relation Age of Onset   Colon cancer Father    Colon cancer Sister    Heart disease Sister    Colon cancer Sister    Stroke Mother    Colon cancer Nephew     Social History   Socioeconomic History   Marital status: Married    Spouse name: Not on file   Number of children: Not on file   Years of education: Not on file   Highest education level: Not on file   Occupational History   Not on file  Tobacco Use   Smoking status: Former    Packs/day: 1.00    Years: 20.00    Pack years: 20.00    Types: Cigarettes    Quit date: 05/16/1991    Years since quitting: 29.5   Smokeless tobacco: Never  Vaping Use   Vaping Use: Never used  Substance and Sexual Activity   Alcohol use: Yes    Comment: rarely   Drug use: No   Sexual activity: Not on file  Other Topics Concern   Not on file  Social History Narrative   Not on file   Social Determinants of Health   Financial Resource Strain: Not on file  Food Insecurity: Not on file  Transportation Needs: Not on file  Physical Activity: Not on file  Stress: Not on file  Social Connections: Not on file  Intimate Partner Violence: Not on file      Review of Systems  Constitutional:  Negative for chills, fatigue and unexpected weight change.  HENT:  Negative for congestion, rhinorrhea, sneezing and sore throat.   Eyes:  Negative for redness.  Respiratory:  Negative for cough, chest tightness and shortness of breath.   Cardiovascular:  Negative for chest pain and palpitations.  Gastrointestinal:  Negative for abdominal pain, constipation, diarrhea, nausea and vomiting.  Genitourinary:  Negative for dysuria and frequency.  Musculoskeletal:  Negative for arthralgias, back pain, joint swelling and neck pain.  Skin:  Negative for rash.  Neurological: Negative.  Negative for tremors and numbness.  Hematological:  Negative for adenopathy. Does not bruise/bleed easily.  Psychiatric/Behavioral:  Negative for behavioral problems (Depression), sleep disturbance and suicidal ideas. The patient is not nervous/anxious.    Vital Signs: BP 140/80 Comment: 159/80  Pulse 72   Temp 98.4 F (36.9 C)   Resp 16   Ht 5\' 6"  (1.676 m)   Wt 190 lb 3.2 oz (86.3 kg)   SpO2 98%   BMI 30.70 kg/m    Physical Exam Vitals reviewed.  Constitutional:      General: She is not in acute distress.    Appearance:  Normal appearance. She is well-developed. She is obese. She is not diaphoretic.  HENT:     Head: Normocephalic and atraumatic.  Neck:     Thyroid: No thyromegaly.     Vascular: No JVD.     Trachea: No tracheal deviation.  Cardiovascular:     Rate and Rhythm: Normal rate and regular rhythm.     Pulses: Normal pulses.     Heart sounds: Normal heart sounds. No murmur heard.   No friction rub. No gallop.  Pulmonary:     Effort: Pulmonary effort is normal.  No respiratory distress.     Breath sounds: Normal breath sounds. No wheezing or rales.  Chest:     Chest wall: No tenderness.  Skin:    General: Skin is warm and dry.     Capillary Refill: Capillary refill takes less than 2 seconds.  Neurological:     Mental Status: She is alert and oriented to person, place, and time.     Cranial Nerves: No cranial nerve deficit.  Psychiatric:        Mood and Affect: Mood normal.        Behavior: Behavior normal.       Assessment/Plan: 1. Essential hypertension Continue current medications, managed by Dr. Nehemiah Massed, recently started patietn on amlodipine 5 mg daily.   2. Sick sinus syndrome (HCC) History of SSS and paroxysmal A-Fib with implanted pacemaker. Managed by Dr. Nehemiah Massed.   3. OSA on CPAP History of OSA, uses CPAP, continue as instructed  4. Gastroesophageal reflux disease without esophagitis Controlled with medications, no changes.   5. Hyperlipemia, mixed Taking a statin, no changes  6. Depression, major, single episode, mild (HCC) History of depression, stable, not currently on medications, denies any depressive symptoms or thoughts at this time.   7. Primary insomnia History of insomnia, takes zolpidem   General Counseling: Cathy Curtis understanding of the findings of todays visit and agrees with plan of treatment. I have discussed any further diagnostic evaluation that may be needed or ordered today. We also reviewed her medications today. she has been  encouraged to call the office with any questions or concerns that should arise related to todays visit.    No orders of the defined types were placed in this encounter.   No orders of the defined types were placed in this encounter.   Return in 26 weeks (on 05/06/2021) for CPE, Cathy Curtis PCP already previously scheduled.   Total time spent:30 Minutes Time spent includes review of chart, medications, test results, and follow up plan with the patient.   [place] Controlled Substance Database was reviewed by me.  This patient was seen by Cathy Osgood, FNP-C in collaboration with Dr. Clayborn Bigness as a part of collaborative care agreement.   Cathy Curtis R. Valetta Fuller, MSN, FNP-C Internal medicine

## 2020-11-17 ENCOUNTER — Encounter: Payer: Self-pay | Admitting: Nurse Practitioner

## 2021-01-10 ENCOUNTER — Other Ambulatory Visit: Payer: Self-pay

## 2021-01-10 DIAGNOSIS — N393 Stress incontinence (female) (male): Secondary | ICD-10-CM

## 2021-01-10 MED ORDER — OXYBUTYNIN CHLORIDE ER 15 MG PO TB24: 15.0000 mg | ORAL_TABLET | Freq: Every day | ORAL | 3 refills | Status: DC

## 2021-01-13 DIAGNOSIS — R0902 Hypoxemia: Secondary | ICD-10-CM | POA: Diagnosis not present

## 2021-01-13 DIAGNOSIS — G4733 Obstructive sleep apnea (adult) (pediatric): Secondary | ICD-10-CM | POA: Diagnosis not present

## 2021-02-27 ENCOUNTER — Other Ambulatory Visit: Payer: Self-pay | Admitting: Internal Medicine

## 2021-02-27 DIAGNOSIS — K219 Gastro-esophageal reflux disease without esophagitis: Secondary | ICD-10-CM

## 2021-02-28 ENCOUNTER — Other Ambulatory Visit: Payer: Self-pay | Admitting: Internal Medicine

## 2021-02-28 DIAGNOSIS — K219 Gastro-esophageal reflux disease without esophagitis: Secondary | ICD-10-CM

## 2021-03-22 ENCOUNTER — Other Ambulatory Visit: Payer: Self-pay | Admitting: Nurse Practitioner

## 2021-03-22 ENCOUNTER — Telehealth: Payer: Self-pay

## 2021-03-22 DIAGNOSIS — I10 Essential (primary) hypertension: Secondary | ICD-10-CM

## 2021-03-22 DIAGNOSIS — E559 Vitamin D deficiency, unspecified: Secondary | ICD-10-CM

## 2021-03-22 DIAGNOSIS — I480 Paroxysmal atrial fibrillation: Secondary | ICD-10-CM

## 2021-03-22 DIAGNOSIS — E782 Mixed hyperlipidemia: Secondary | ICD-10-CM

## 2021-03-22 DIAGNOSIS — I495 Sick sinus syndrome: Secondary | ICD-10-CM

## 2021-03-22 NOTE — Telephone Encounter (Signed)
Patient called asking about an order for a new cpap machine. I contacted [facility] who stated they don't have record of an order, they stated the provider will have to put one in along with compliance notes at the patients next visit. Notified pt and they understood and agreed.

## 2021-03-22 NOTE — Telephone Encounter (Signed)
Lab orders for this patient need to be put in for upcoming physical coming up 05/11/2021 per patients request. Please call to notify patient once those orders are in, thanks ina advance!

## 2021-03-22 NOTE — Telephone Encounter (Signed)
Pt advised we put lab order

## 2021-03-30 ENCOUNTER — Ambulatory Visit (INDEPENDENT_AMBULATORY_CARE_PROVIDER_SITE_OTHER): Payer: Medicare PPO

## 2021-03-30 ENCOUNTER — Other Ambulatory Visit: Payer: Self-pay

## 2021-03-30 DIAGNOSIS — G4733 Obstructive sleep apnea (adult) (pediatric): Secondary | ICD-10-CM

## 2021-03-30 NOTE — Progress Notes (Signed)
95 percentile pressure 9   95th percentile leak 0.0    apnea-hypopnea index  0.6 /hr   total days used  >4 hr 80 days  total days used <4 hr 10 days  Total compliance 87.8 percent  No problems or questions at this time Pt was seen by Claiborne Billings from [facility]

## 2021-04-18 ENCOUNTER — Other Ambulatory Visit: Payer: Self-pay | Admitting: Internal Medicine

## 2021-04-18 DIAGNOSIS — F5101 Primary insomnia: Secondary | ICD-10-CM

## 2021-04-29 DIAGNOSIS — I480 Paroxysmal atrial fibrillation: Secondary | ICD-10-CM | POA: Diagnosis not present

## 2021-04-29 DIAGNOSIS — E559 Vitamin D deficiency, unspecified: Secondary | ICD-10-CM | POA: Diagnosis not present

## 2021-04-29 DIAGNOSIS — I10 Essential (primary) hypertension: Secondary | ICD-10-CM | POA: Diagnosis not present

## 2021-04-29 DIAGNOSIS — E782 Mixed hyperlipidemia: Secondary | ICD-10-CM | POA: Diagnosis not present

## 2021-04-29 DIAGNOSIS — I495 Sick sinus syndrome: Secondary | ICD-10-CM | POA: Diagnosis not present

## 2021-04-30 LAB — LIPID PANEL
Chol/HDL Ratio: 2.6 ratio (ref 0.0–4.4)
Cholesterol, Total: 147 mg/dL (ref 100–199)
HDL: 57 mg/dL (ref >39)
LDL Chol Calc (NIH): 77 mg/dL (ref 0–99)
Triglycerides: 67 mg/dL (ref 0–149)
VLDL Cholesterol Cal: 13 mg/dL (ref 5–40)

## 2021-04-30 LAB — CBC WITH DIFFERENTIAL/PLATELET
Basophils Absolute: 0 10*3/uL (ref 0.0–0.2)
Basos: 0 %
EOS (ABSOLUTE): 0.2 10*3/uL (ref 0.0–0.4)
Eos: 3 %
Hematocrit: 36.9 % (ref 34.0–46.6)
Hemoglobin: 12.3 g/dL (ref 11.1–15.9)
Immature Grans (Abs): 0 10*3/uL (ref 0.0–0.1)
Immature Granulocytes: 0 %
Lymphocytes Absolute: 1.6 10*3/uL (ref 0.7–3.1)
Lymphs: 23 %
MCH: 30.2 pg (ref 26.6–33.0)
MCHC: 33.3 g/dL (ref 31.5–35.7)
MCV: 91 fL (ref 79–97)
Monocytes Absolute: 0.5 10*3/uL (ref 0.1–0.9)
Monocytes: 7 %
Neutrophils Absolute: 4.6 10*3/uL (ref 1.4–7.0)
Neutrophils: 67 %
Platelets: 157 10*3/uL (ref 150–450)
RBC: 4.07 x10E6/uL (ref 3.77–5.28)
RDW: 13.1 % (ref 11.7–15.4)
WBC: 7 10*3/uL (ref 3.4–10.8)

## 2021-04-30 LAB — CMP14+EGFR
ALT: 8 IU/L (ref 0–32)
AST: 14 IU/L (ref 0–40)
Albumin/Globulin Ratio: 1.5 (ref 1.2–2.2)
Albumin: 4.1 g/dL (ref 3.7–4.7)
Alkaline Phosphatase: 70 IU/L (ref 44–121)
BUN/Creatinine Ratio: 27 (ref 12–28)
BUN: 29 mg/dL — ABNORMAL HIGH (ref 8–27)
Bilirubin Total: 0.4 mg/dL (ref 0.0–1.2)
CO2: 23 mmol/L (ref 20–29)
Calcium: 9.7 mg/dL (ref 8.7–10.3)
Chloride: 104 mmol/L (ref 96–106)
Creatinine, Ser: 1.06 mg/dL — ABNORMAL HIGH (ref 0.57–1.00)
Globulin, Total: 2.8 g/dL (ref 1.5–4.5)
Glucose: 100 mg/dL — ABNORMAL HIGH (ref 70–99)
Potassium: 4.5 mmol/L (ref 3.5–5.2)
Sodium: 142 mmol/L (ref 134–144)
Total Protein: 6.9 g/dL (ref 6.0–8.5)
eGFR: 54 mL/min/{1.73_m2} — ABNORMAL LOW (ref >59)

## 2021-04-30 LAB — TSH+FREE T4
Free T4: 1.1 ng/dL (ref 0.82–1.77)
TSH: 0.886 u[IU]/mL (ref 0.450–4.500)

## 2021-04-30 LAB — VITAMIN D 25 HYDROXY (VIT D DEFICIENCY, FRACTURES): Vit D, 25-Hydroxy: 51.4 ng/mL (ref 30.0–100.0)

## 2021-05-02 NOTE — Progress Notes (Signed)
I have reviewed the lab results. There are no critically abnormal values requiring immediate intervention but there are some abnormals that will be discussed at the next office visit.  

## 2021-05-05 DIAGNOSIS — H353131 Nonexudative age-related macular degeneration, bilateral, early dry stage: Secondary | ICD-10-CM | POA: Diagnosis not present

## 2021-05-05 DIAGNOSIS — H524 Presbyopia: Secondary | ICD-10-CM | POA: Diagnosis not present

## 2021-05-11 ENCOUNTER — Encounter: Payer: Self-pay | Admitting: Nurse Practitioner

## 2021-05-11 ENCOUNTER — Ambulatory Visit (INDEPENDENT_AMBULATORY_CARE_PROVIDER_SITE_OTHER): Payer: Medicare PPO | Admitting: Nurse Practitioner

## 2021-05-11 ENCOUNTER — Other Ambulatory Visit: Payer: Self-pay

## 2021-05-11 VITALS — BP 130/82 | HR 67 | Temp 97.8°F | Resp 16 | Ht 66.0 in | Wt 201.6 lb

## 2021-05-11 DIAGNOSIS — I480 Paroxysmal atrial fibrillation: Secondary | ICD-10-CM

## 2021-05-11 DIAGNOSIS — R300 Dysuria: Secondary | ICD-10-CM | POA: Diagnosis not present

## 2021-05-11 DIAGNOSIS — Z0001 Encounter for general adult medical examination with abnormal findings: Secondary | ICD-10-CM

## 2021-05-11 DIAGNOSIS — I10 Essential (primary) hypertension: Secondary | ICD-10-CM

## 2021-05-11 DIAGNOSIS — G4733 Obstructive sleep apnea (adult) (pediatric): Secondary | ICD-10-CM

## 2021-05-11 DIAGNOSIS — Z23 Encounter for immunization: Secondary | ICD-10-CM

## 2021-05-11 MED ORDER — PNEUMOCOCCAL 20-VAL CONJ VACC 0.5 ML IM SUSY: 0.5000 mL | PREFILLED_SYRINGE | Freq: Once | INTRAMUSCULAR | 0 refills | Status: AC

## 2021-05-11 NOTE — Addendum Note (Signed)
Addended by: Jonetta Osgood on: 05/11/2021 11:58 AM   Modules accepted: Orders, Level of Service

## 2021-05-11 NOTE — Progress Notes (Signed)
[facility] [place], [place] [address]  Internal MEDICINE  Office Visit Note  Patient Name: Cathy Curtis  [id number]  [id number]  Date of Service: 05/11/2021  Chief Complaint  Patient presents with   Medicare Wellness    Pt states she does not need breast exam bc she gets mammograms annually   Hyperlipidemia   Hypertension    HPI [name] presents for an annual well visit and physical exam. She is a well appearing 80 yo female. Her last colonoscopy was done in 2021 and it was normal. She needs to get her mammogram scheduled with Dr. Fleet Contras. Her home BP log is good, BP is stable and controlled. Discussed labs with patient, all labs were normal except for slightly elevated creatinine and GFR of 54. She does have some arthritis in her knees bilaterally. She manages this with OTC medication right now. She is a former smoker and quit in 1993. She has no other questions or concerns and no new or worsening pains.      Current Medication: Outpatient Encounter Medications as of 05/11/2021  Medication Sig   acetaminophen (TYLENOL) 500 MG tablet Take 500 mg by mouth every 6 (six) hours as needed.   amLODipine (NORVASC) 5 MG tablet Take by mouth.   apixaban (ELIQUIS) 5 MG TABS tablet Take 1 tablet (5 mg total) by mouth 2 (two) times daily.   atorvastatin (LIPITOR) 10 MG tablet TAKE 1 TABLET BY MOUTH ONCE DAILY   Calcium Citrate-Vitamin D 200-250 MG-UNIT TABS Take 600 mg by mouth.    celecoxib (CELEBREX) 200 MG capsule Take 1 capsule (200 mg total) by mouth daily.   Cholecalciferol (VITAMIN D-3) 1000 UNITS CAPS Take by mouth daily.   Coenzyme Q10 (CO Q-10) 100 MG CAPS Take 200 mg by mouth daily.    metoprolol tartrate (LOPRESSOR) 25 MG tablet Take 25 mg by mouth 2 (two) times daily.   omeprazole (PRILOSEC) 40 MG capsule TAKE (1) CAPSULE BY MOUTH ONCE DAILY.   oxybutynin (DITROPAN XL) 15 MG 24 hr tablet Take 1 tablet (15 mg total) by mouth at bedtime.   pneumococcal  20-valent conjugate vaccine (PREVNAR 20) 0.5 ML injection Inject 0.5 mLs into the muscle once for 1 dose.   telmisartan (MICARDIS) 80 MG tablet    zolpidem (AMBIEN) 10 MG tablet TAKE 1 TABLET BY MOUTH AT BEDTIME AS NEEDED FOR SLEEP   No facility-administered encounter medications on file as of 05/11/2021.    Surgical History: Past Surgical History:  Procedure Laterality Date   ABDOMINAL HYSTERECTOMY     CARDIOVERSION  2009, 2010   CHOLECYSTECTOMY     COLONOSCOPY  2010,02/2014   Dr. Jamal Collin, [place] WITH PROPOFOL N/A 11/14/2019   Procedure: COLONOSCOPY WITH PROPOFOL;  Surgeon: Robert Bellow, MD;  Location: [facility] ENDOSCOPY;  Service: Endoscopy;  Laterality: N/A;   HEMORRHOID SURGERY  09-06-2007   stapled   INSERT / REPLACE / REMOVE PACEMAKER     PACEMAKER INSERTION Left 09/2011   SALPINGOOPHORECTOMY  1974    Medical History: Past Medical History:  Diagnosis Date   Atrial fibrillation ([place]) 2016   Atrial flutter ([place]) 2009   Breast screening, unspecified    Diffuse cystic mastopathy    FCD   Family history of malignant neoplasm of gastrointestinal tract    Heart disease    Hemorrhoids 2009   resolved   Hiatal hernia    Hyperlipidemia    Obesity, unspecified    Osteoarthritis  Osteoporosis    Personal history of tobacco use, presenting hazards to health    Screening for obesity    Sleep apnea    admits to c-pap   Special screening for malignant neoplasms, colon    Unspecified essential hypertension 2002    Family History: Family History  Problem Relation Age of Onset   Colon cancer Father    Colon cancer Sister    Heart disease Sister    Colon cancer Sister    Stroke Mother    Colon cancer Nephew     Social History   Socioeconomic History   Marital status: Married    Spouse name: Not on file   Number of children: Not on file   Years of education: Not on file   Highest education level: Not on file  Occupational History   Not on file   Tobacco Use   Smoking status: Former    Packs/day: 1.00    Years: 20.00    Pack years: 20.00    Types: Cigarettes    Quit date: 05/16/1991    Years since quitting: 30.0   Smokeless tobacco: Never  Vaping Use   Vaping Use: Never used  Substance and Sexual Activity   Alcohol use: Yes    Comment: rarely   Drug use: No   Sexual activity: Not on file  Other Topics Concern   Not on file  Social History Narrative   Not on file   Social Determinants of Health   Financial Resource Strain: Not on file  Food Insecurity: Not on file  Transportation Needs: Not on file  Physical Activity: Not on file  Stress: Not on file  Social Connections: Not on file  Intimate Partner Violence: Not on file      Review of Systems  Constitutional:  Negative for activity change, appetite change, chills, fatigue, fever and unexpected weight change.  HENT:  Negative for congestion, ear pain, postnasal drip, rhinorrhea, sneezing, sore throat and trouble swallowing.   Eyes: Negative.  Negative for redness.  Respiratory: Negative.  Negative for cough, chest tightness, shortness of breath and wheezing.   Cardiovascular: Negative.  Negative for chest pain and palpitations.  Gastrointestinal: Negative.  Negative for abdominal pain, blood in stool, constipation, diarrhea, nausea and vomiting.  Endocrine: Negative.   Genitourinary: Negative.  Negative for difficulty urinating, dysuria, frequency, hematuria and urgency.  Musculoskeletal:  Positive for arthralgias. Negative for back pain, joint swelling, myalgias and neck pain.  Skin: Negative.  Negative for rash and wound.  Allergic/Immunologic: Negative.  Negative for immunocompromised state.  Neurological: Negative.  Negative for dizziness, tremors, seizures, numbness and headaches.  Hematological: Negative.  Negative for adenopathy. Does not bruise/bleed easily.  Psychiatric/Behavioral: Negative.  Negative for behavioral problems (Depression), self-injury,  sleep disturbance and suicidal ideas. The patient is not nervous/anxious.    Vital Signs: BP 130/82 Comment: 150/77   Pulse 67    Temp 97.8 F (36.6 C)    Resp 16    Ht 5\' 6"  (1.676 m)    Wt 201 lb 9.6 oz (91.4 kg)    SpO2 98%    BMI 32.54 kg/m    Physical Exam Vitals reviewed.  Constitutional:      General: She is awake. She is not in acute distress.    Appearance: Normal appearance. She is well-developed and well-groomed. She is obese. She is not ill-appearing or diaphoretic.  HENT:     Head: Normocephalic and atraumatic.     Right Ear: Tympanic membrane,  ear canal and external ear normal.     Left Ear: Tympanic membrane, ear canal and external ear normal.     Nose: Nose normal. No congestion or rhinorrhea.     Mouth/Throat:     Mouth: Mucous membranes are moist.     Pharynx: Oropharynx is clear. Uvula midline. No oropharyngeal exudate or posterior oropharyngeal erythema.  Eyes:     General: Lids are normal. Vision grossly intact. Gaze aligned appropriately. No scleral icterus.       Right eye: No discharge.        Left eye: No discharge.     Extraocular Movements: Extraocular movements intact.     Conjunctiva/sclera: Conjunctivae normal.     Pupils: Pupils are equal, round, and reactive to light.     Funduscopic exam:    Right eye: Red reflex present.        Left eye: Red reflex present. Neck:     Thyroid: No thyromegaly.     Vascular: No JVD.     Trachea: Trachea and phonation normal. No tracheal deviation.  Cardiovascular:     Rate and Rhythm: Normal rate and regular rhythm.     Pulses: Normal pulses.     Heart sounds: Normal heart sounds, S1 normal and S2 normal. No murmur heard.   No friction rub. No gallop.  Pulmonary:     Effort: Pulmonary effort is normal. No accessory muscle usage or respiratory distress.     Breath sounds: Normal breath sounds and air entry. No stridor. No wheezing or rales.  Chest:     Chest wall: No tenderness.  Abdominal:     General:  Bowel sounds are normal. There is no distension.     Palpations: Abdomen is soft. There is no shifting dullness, fluid wave, mass or pulsatile mass.     Tenderness: There is no abdominal tenderness. There is no guarding or rebound.  Musculoskeletal:        General: No tenderness or deformity. Normal range of motion.     Cervical back: Normal range of motion and neck supple.  Lymphadenopathy:     Cervical: No cervical adenopathy.  Skin:    General: Skin is warm and dry.     Capillary Refill: Capillary refill takes less than 2 seconds.     Coloration: Skin is not pale.     Findings: No erythema or rash.  Neurological:     Mental Status: She is alert and oriented to person, place, and time.     Cranial Nerves: No cranial nerve deficit.     Motor: No abnormal muscle tone.     Coordination: Coordination normal.     Gait: Gait normal.     Deep Tendon Reflexes: Reflexes are normal and symmetric.  Psychiatric:        Mood and Affect: Mood and affect normal.        Behavior: Behavior normal. Behavior is cooperative.        Thought Content: Thought content normal.        Judgment: Judgment normal.       Assessment/Plan: 1. Encounter for general adult medical examination with abnormal findings Age-appropriate preventive screenings and vaccinations discussed, annual physical exam completed. Routine labs for health maintenance results discussed with patient today. PHM updated.   2. Essential hypertension Stable with current medications  3. Paroxysmal atrial fibrillation (HCC) Has a pacemaker, followed by cardiology  4. OSA on CPAP Stable, compliant with CPAP, follow up with Claiborne Billings.   5. Dysuria  Routine urinalysis done - UA/M w/rflx Culture, Routine  6. Need for vaccination - pneumococcal 20-valent conjugate vaccine (PREVNAR 20) 0.5 ML injection; Inject 0.5 mLs into the muscle once for 1 dose.  Dispense: 0.5 mL; Refill: 0      General Counseling: Cambrey verbalizes understanding  of the findings of todays visit and agrees with plan of treatment. I have discussed any further diagnostic evaluation that may be needed or ordered today. We also reviewed her medications today. she has been encouraged to call the office with any questions or concerns that should arise related to todays visit.    Orders Placed This Encounter  Procedures   UA/M w/rflx Culture, Routine    Meds ordered this encounter  Medications   pneumococcal 20-valent conjugate vaccine (PREVNAR 20) 0.5 ML injection    Sig: Inject 0.5 mLs into the muscle once for 1 dose.    Dispense:  0.5 mL    Refill:  0    Return in about 6 months (around 11/09/2021) for F/U, med refill, Cyndee Giammarco PCP.   Total time spent:30 Minutes Time spent includes review of chart, medications, test results, and follow up plan with the patient.   [place] Controlled Substance Database was reviewed by me.  This patient was seen by Jonetta Osgood, FNP-C in collaboration with Dr. Clayborn Bigness as a part of collaborative care agreement.  Sterling Mondo R. Valetta Fuller, MSN, FNP-C Internal medicine

## 2021-05-18 ENCOUNTER — Other Ambulatory Visit: Payer: Self-pay | Admitting: Nurse Practitioner

## 2021-05-18 DIAGNOSIS — N3000 Acute cystitis without hematuria: Secondary | ICD-10-CM

## 2021-05-18 LAB — MICROSCOPIC EXAMINATION: Casts: NONE SEEN /lpf

## 2021-05-18 LAB — UA/M W/RFLX CULTURE, ROUTINE
Bilirubin, UA: NEGATIVE
Glucose, UA: NEGATIVE
Ketones, UA: NEGATIVE
Nitrite, UA: POSITIVE — AB
Protein,UA: NEGATIVE
RBC, UA: NEGATIVE
Specific Gravity, UA: 1.016 (ref 1.005–1.030)
Urobilinogen, Ur: 0.2 mg/dL (ref 0.2–1.0)
pH, UA: 6.5 (ref 5.0–7.5)

## 2021-05-18 LAB — URINE CULTURE, REFLEX

## 2021-05-18 MED ORDER — CIPROFLOXACIN HCL 250 MG PO TABS: 250.0000 mg | ORAL_TABLET | Freq: Two times a day (BID) | ORAL | 0 refills | Status: AC

## 2021-06-13 DIAGNOSIS — I10 Essential (primary) hypertension: Secondary | ICD-10-CM | POA: Diagnosis not present

## 2021-06-13 DIAGNOSIS — G4733 Obstructive sleep apnea (adult) (pediatric): Secondary | ICD-10-CM | POA: Diagnosis not present

## 2021-06-13 DIAGNOSIS — I340 Nonrheumatic mitral (valve) insufficiency: Secondary | ICD-10-CM | POA: Diagnosis not present

## 2021-06-13 DIAGNOSIS — I495 Sick sinus syndrome: Secondary | ICD-10-CM | POA: Diagnosis not present

## 2021-06-13 DIAGNOSIS — E782 Mixed hyperlipidemia: Secondary | ICD-10-CM | POA: Diagnosis not present

## 2021-06-13 DIAGNOSIS — R0602 Shortness of breath: Secondary | ICD-10-CM | POA: Diagnosis not present

## 2021-06-13 DIAGNOSIS — I480 Paroxysmal atrial fibrillation: Secondary | ICD-10-CM | POA: Diagnosis not present

## 2021-06-14 ENCOUNTER — Other Ambulatory Visit: Payer: Self-pay | Admitting: Internal Medicine

## 2021-06-14 DIAGNOSIS — F5101 Primary insomnia: Secondary | ICD-10-CM

## 2021-06-16 NOTE — Telephone Encounter (Signed)
Med sent to pharmacy.

## 2021-06-22 DIAGNOSIS — I495 Sick sinus syndrome: Secondary | ICD-10-CM | POA: Diagnosis not present

## 2021-06-22 DIAGNOSIS — I480 Paroxysmal atrial fibrillation: Secondary | ICD-10-CM | POA: Diagnosis not present

## 2021-06-22 DIAGNOSIS — R0602 Shortness of breath: Secondary | ICD-10-CM | POA: Diagnosis not present

## 2021-07-04 ENCOUNTER — Other Ambulatory Visit: Payer: Self-pay | Admitting: Internal Medicine

## 2021-07-04 DIAGNOSIS — G8929 Other chronic pain: Secondary | ICD-10-CM

## 2021-07-06 DIAGNOSIS — I495 Sick sinus syndrome: Secondary | ICD-10-CM | POA: Diagnosis not present

## 2021-07-06 DIAGNOSIS — I10 Essential (primary) hypertension: Secondary | ICD-10-CM | POA: Diagnosis not present

## 2021-07-06 DIAGNOSIS — Z950 Presence of cardiac pacemaker: Secondary | ICD-10-CM | POA: Diagnosis not present

## 2021-07-06 DIAGNOSIS — E782 Mixed hyperlipidemia: Secondary | ICD-10-CM | POA: Diagnosis not present

## 2021-07-06 DIAGNOSIS — I480 Paroxysmal atrial fibrillation: Secondary | ICD-10-CM | POA: Diagnosis not present

## 2021-07-06 DIAGNOSIS — I340 Nonrheumatic mitral (valve) insufficiency: Secondary | ICD-10-CM | POA: Diagnosis not present

## 2021-08-15 ENCOUNTER — Other Ambulatory Visit: Payer: Self-pay | Admitting: Nurse Practitioner

## 2021-08-15 DIAGNOSIS — F5101 Primary insomnia: Secondary | ICD-10-CM

## 2021-09-20 DIAGNOSIS — I495 Sick sinus syndrome: Secondary | ICD-10-CM | POA: Diagnosis not present

## 2021-10-17 ENCOUNTER — Other Ambulatory Visit: Payer: Self-pay | Admitting: Internal Medicine

## 2021-10-17 DIAGNOSIS — F5101 Primary insomnia: Secondary | ICD-10-CM

## 2021-10-17 DIAGNOSIS — N393 Stress incontinence (female) (male): Secondary | ICD-10-CM

## 2021-10-25 NOTE — Telephone Encounter (Signed)
Med sent to pharmacy.

## 2021-11-09 ENCOUNTER — Encounter: Payer: Self-pay | Admitting: Nurse Practitioner

## 2021-11-09 ENCOUNTER — Ambulatory Visit: Payer: Medicare PPO | Admitting: Nurse Practitioner

## 2021-11-09 VITALS — BP 134/71 | HR 71 | Temp 98.6°F | Resp 16 | Ht 66.0 in | Wt 199.8 lb

## 2021-11-09 DIAGNOSIS — F5101 Primary insomnia: Secondary | ICD-10-CM

## 2021-11-09 DIAGNOSIS — G4733 Obstructive sleep apnea (adult) (pediatric): Secondary | ICD-10-CM

## 2021-11-09 DIAGNOSIS — Z9989 Dependence on other enabling machines and devices: Secondary | ICD-10-CM | POA: Diagnosis not present

## 2021-11-09 DIAGNOSIS — I10 Essential (primary) hypertension: Secondary | ICD-10-CM

## 2021-11-09 DIAGNOSIS — Z1231 Encounter for screening mammogram for malignant neoplasm of breast: Secondary | ICD-10-CM

## 2021-11-09 DIAGNOSIS — I480 Paroxysmal atrial fibrillation: Secondary | ICD-10-CM

## 2021-11-09 MED ORDER — ZOLPIDEM TARTRATE 10 MG PO TABS: 10.0000 mg | ORAL_TABLET | Freq: Every evening | ORAL | 1 refills | Status: DC | PRN

## 2021-11-09 NOTE — Progress Notes (Signed)
[facility] [place], [place] [address]  Internal MEDICINE  Office Visit Note  Patient Name: Cathy Curtis  [id number]  [id number]  Date of Service: 11/09/2021  Chief Complaint  Patient presents with   Follow-up    At next CPE wants breast exam    Hyperlipidemia   Hypertension    HPI Cathy Curtis presents for a follow-up visit for hypertension, hyperlipidemia and obstructive sleep apnea.  She also brought her blood pressure and heart rate log from home.  Her blood pressure has been consistently stable.  She has had a couple of low heart rates in the 40s.  She does have a pacemaker that is supposed to kick in if her heart rate drops below 50 per patient report. Her general surgeon Dr. Bary Castilla is retiring and she would like to have a mammogram ordered and she is also requesting to have her breast exam done at her next annual wellness visit. She uses CPAP for obstructive sleep apnea and she has had her current CPAP for 5 to 6 years.  She spoke to [place] from [facility] patient and she said that the patient should be able to get a new CPAP machine and her insurance should cover it.  The patient needs an order for a new CPAP machine and her current CPAP setting is a pressure of 9.   Current Medication: Outpatient Encounter Medications as of 11/09/2021  Medication Sig   acetaminophen (TYLENOL) 500 MG tablet Take 500 mg by mouth every 6 (six) hours as needed.   apixaban (ELIQUIS) 5 MG TABS tablet Take 1 tablet (5 mg total) by mouth 2 (two) times daily.   atorvastatin (LIPITOR) 10 MG tablet TAKE 1 TABLET BY MOUTH ONCE DAILY   Calcium Citrate-Vitamin D 200-250 MG-UNIT TABS Take 600 mg by mouth.    celecoxib (CELEBREX) 200 MG capsule TAKE (1) CAPSULE BY MOUTH ONCE DAILY.   Cholecalciferol (VITAMIN D-3) 1000 UNITS CAPS Take by mouth daily.   Coenzyme Q10 (CO Q-10) 100 MG CAPS Take 200 mg by mouth daily.    metoprolol tartrate (LOPRESSOR) 25 MG tablet Take 25 mg by mouth 2  (two) times daily.   omeprazole (PRILOSEC) 40 MG capsule TAKE (1) CAPSULE BY MOUTH ONCE DAILY.   oxybutynin (DITROPAN XL) 15 MG 24 hr tablet TAKE ONE TABLET BY MOUTH AT BEDTIME.   telmisartan (MICARDIS) 80 MG tablet    [DISCONTINUED] zolpidem (AMBIEN) 10 MG tablet TAKE 1 TABLET BY MOUTH AT BEDTIME AS NEEDED FOR SLEEP   zolpidem (AMBIEN) 10 MG tablet Take 1 tablet (10 mg total) by mouth at bedtime as needed. for sleep   No facility-administered encounter medications on file as of 11/09/2021.    Surgical History: Past Surgical History:  Procedure Laterality Date   ABDOMINAL HYSTERECTOMY     CARDIOVERSION  2009, 2010   CHOLECYSTECTOMY     COLONOSCOPY  2010,02/2014   Dr. Jamal Collin, [place] WITH PROPOFOL N/A 11/14/2019   Procedure: COLONOSCOPY WITH PROPOFOL;  Surgeon: Robert Bellow, MD;  Location: [facility] ENDOSCOPY;  Service: Endoscopy;  Laterality: N/A;   HEMORRHOID SURGERY  09-06-2007   stapled   INSERT / REPLACE / REMOVE PACEMAKER     PACEMAKER INSERTION Left 09/2011   SALPINGOOPHORECTOMY  1974    Medical History: Past Medical History:  Diagnosis Date   Atrial fibrillation ([place]) 2016   Atrial flutter ([place]) 2009   Breast screening, unspecified    Diffuse cystic mastopathy    FCD  Family history of malignant neoplasm of gastrointestinal tract    Heart disease    Hemorrhoids 2009   resolved   Hiatal hernia    Hyperlipidemia    Obesity, unspecified    Osteoarthritis    Osteoporosis    Personal history of tobacco use, presenting hazards to health    Screening for obesity    Sleep apnea    admits to c-pap   Special screening for malignant neoplasms, colon    Unspecified essential hypertension 2002    Family History: Family History  Problem Relation Age of Onset   Colon cancer Father    Colon cancer Sister    Heart disease Sister    Colon cancer Sister    Stroke Mother    Colon cancer Nephew     Social History   Socioeconomic History   Marital status:  Married    Spouse name: Not on file   Number of children: Not on file   Years of education: Not on file   Highest education level: Not on file  Occupational History   Not on file  Tobacco Use   Smoking status: Former    Packs/day: 1.00    Years: 20.00    Total pack years: 20.00    Types: Cigarettes    Quit date: 05/16/1991    Years since quitting: 30.5   Smokeless tobacco: Never  Vaping Use   Vaping Use: Never used  Substance and Sexual Activity   Alcohol use: Yes    Comment: rarely   Drug use: No   Sexual activity: Not on file  Other Topics Concern   Not on file  Social History Narrative   Not on file   Social Determinants of Health   Financial Resource Strain: Not on file  Food Insecurity: Not on file  Transportation Needs: Not on file  Physical Activity: Not on file  Stress: Not on file  Social Connections: Not on file  Intimate Partner Violence: Not on file      Review of Systems  Constitutional:  Negative for chills, fatigue and unexpected weight change.  HENT:  Negative for congestion, rhinorrhea, sneezing and sore throat.   Eyes:  Negative for redness.  Respiratory:  Negative for cough, chest tightness and shortness of breath.   Cardiovascular:  Negative for chest pain and palpitations.  Gastrointestinal:  Negative for abdominal pain, constipation, diarrhea, nausea and vomiting.  Genitourinary:  Negative for dysuria and frequency.  Musculoskeletal:  Negative for arthralgias, back pain, joint swelling and neck pain.  Skin:  Negative for rash.  Neurological: Negative.  Negative for tremors and numbness.  Hematological:  Negative for adenopathy. Does not bruise/bleed easily.  Psychiatric/Behavioral:  Negative for behavioral problems (Depression), sleep disturbance and suicidal ideas. The patient is not nervous/anxious.     Vital Signs: BP 134/71   Pulse 71   Temp 98.6 F (37 C)   Resp 16   Ht '5\' 6"'$  (1.676 m)   Wt 199 lb 12.8 oz (90.6 kg)   SpO2 98%    BMI 32.25 kg/m    Physical Exam Vitals reviewed.  Constitutional:      General: She is not in acute distress.    Appearance: Normal appearance. She is well-developed. She is obese. She is not ill-appearing or diaphoretic.  HENT:     Head: Normocephalic and atraumatic.  Eyes:     Pupils: Pupils are equal, round, and reactive to light.  Neck:     Thyroid: No thyromegaly.  Vascular: No JVD.     Trachea: No tracheal deviation.  Cardiovascular:     Rate and Rhythm: Normal rate and regular rhythm.  Pulmonary:     Effort: Pulmonary effort is normal. No respiratory distress.  Neurological:     Mental Status: She is alert and oriented to person, place, and time.  Psychiatric:        Mood and Affect: Mood normal.        Behavior: Behavior normal.        Assessment/Plan: 1. Essential hypertension Stable and well controlled, no changes, continue medications as prescribed.   2. Paroxysmal atrial fibrillation (HCC) Rate controlled, on metoprolol, eliquis for anticoagulation  3. OSA on CPAP Due for new CPAP, order placed - For home use only DME continuous positive airway pressure (CPAP)  4. Primary insomnia Ambien refills ordered.  - zolpidem (AMBIEN) 10 MG tablet; Take 1 tablet (10 mg total) by mouth at bedtime as needed. for sleep  Dispense: 30 tablet; Refill: 1  5. Encounter for screening mammogram for malignant neoplasm of breast Routine mammogram ordered.  - MM 3D SCREEN BREAST BILATERAL; Future   General Counseling: davionne mastrangelo understanding of the findings of todays visit and agrees with plan of treatment. I have discussed any further diagnostic evaluation that may be needed or ordered today. We also reviewed her medications today. she has been encouraged to call the office with any questions or concerns that should arise related to todays visit.    Orders Placed This Encounter  Procedures   For home use only DME continuous positive airway pressure (CPAP)    MM 3D SCREEN BREAST BILATERAL    Meds ordered this encounter  Medications   zolpidem (AMBIEN) 10 MG tablet    Sig: Take 1 tablet (10 mg total) by mouth at bedtime as needed. for sleep    Dispense:  30 tablet    Refill:  1    Return for previously scheduled, CPE, Emojean Gertz PCP in approx 6 months.   Total time spent:30 Minutes Time spent includes review of chart, medications, test results, and follow up plan with the patient.   [place] Controlled Substance Database was reviewed by me.  This patient was seen by Jonetta Osgood, FNP-C in collaboration with Dr. Clayborn Bigness as a part of collaborative care agreement.   Jaydyn Menon R. Valetta Fuller, MSN, FNP-C Internal medicine

## 2021-11-10 ENCOUNTER — Telehealth: Payer: Self-pay

## 2021-11-10 NOTE — Telephone Encounter (Addendum)
Sent message to [place], order for CPAP in epic, and requested for them to review.  Called [facility] 11-11-21, spoke to [facility] and sent message to her for Cpap

## 2021-11-18 ENCOUNTER — Encounter: Payer: Self-pay | Admitting: Nurse Practitioner

## 2021-11-21 ENCOUNTER — Other Ambulatory Visit: Payer: Self-pay | Admitting: Internal Medicine

## 2021-11-30 ENCOUNTER — Ambulatory Visit (INDEPENDENT_AMBULATORY_CARE_PROVIDER_SITE_OTHER): Payer: Medicare PPO

## 2021-11-30 DIAGNOSIS — G4733 Obstructive sleep apnea (adult) (pediatric): Secondary | ICD-10-CM

## 2021-11-30 NOTE — Progress Notes (Signed)
She was setup on a new resmed S-11 auto cpap @ 5-10 cmH20 with humidifier and supplies. She has good understanding of cleaning and using the cpap    Pt was seen by Claiborne Billings  RRT/RCP  from [facility]

## 2021-12-20 DIAGNOSIS — I495 Sick sinus syndrome: Secondary | ICD-10-CM | POA: Diagnosis not present

## 2021-12-28 ENCOUNTER — Ambulatory Visit (INDEPENDENT_AMBULATORY_CARE_PROVIDER_SITE_OTHER): Payer: Medicare PPO

## 2021-12-28 DIAGNOSIS — G4733 Obstructive sleep apnea (adult) (pediatric): Secondary | ICD-10-CM | POA: Diagnosis not present

## 2021-12-28 NOTE — Progress Notes (Unsigned)
95 percentile pressure 9.9   95th percentile leak 30.7   apnea index 0.3 /hr  apnea-hypopnea index  0.4 /hr   total days used  >4 hr 27 days  total days used <4 hr 2 days  Total compliance 90 percent  She is doing great likes new machine.no problems or questions at this time   Pt was seen by Claiborne Billings  RRT/RCP  from [facility]

## 2022-01-10 DIAGNOSIS — I340 Nonrheumatic mitral (valve) insufficiency: Secondary | ICD-10-CM | POA: Diagnosis not present

## 2022-01-10 DIAGNOSIS — I10 Essential (primary) hypertension: Secondary | ICD-10-CM | POA: Diagnosis not present

## 2022-01-10 DIAGNOSIS — I495 Sick sinus syndrome: Secondary | ICD-10-CM | POA: Diagnosis not present

## 2022-01-10 DIAGNOSIS — I480 Paroxysmal atrial fibrillation: Secondary | ICD-10-CM | POA: Diagnosis not present

## 2022-01-12 ENCOUNTER — Ambulatory Visit: Payer: Medicare PPO | Admitting: Physician Assistant

## 2022-01-12 ENCOUNTER — Encounter: Payer: Self-pay | Admitting: Physician Assistant

## 2022-01-12 VITALS — BP 130/72 | HR 70 | Temp 98.2°F | Resp 16 | Ht 66.0 in | Wt 201.2 lb

## 2022-01-12 DIAGNOSIS — I10 Essential (primary) hypertension: Secondary | ICD-10-CM

## 2022-01-12 DIAGNOSIS — Z7189 Other specified counseling: Secondary | ICD-10-CM | POA: Diagnosis not present

## 2022-01-12 DIAGNOSIS — E669 Obesity, unspecified: Secondary | ICD-10-CM | POA: Diagnosis not present

## 2022-01-12 DIAGNOSIS — Z9989 Dependence on other enabling machines and devices: Secondary | ICD-10-CM

## 2022-01-12 DIAGNOSIS — G4733 Obstructive sleep apnea (adult) (pediatric): Secondary | ICD-10-CM

## 2022-01-12 NOTE — Progress Notes (Signed)
[facility] [place], [place] [address]  Pulmonary Sleep Medicine   Office Visit Note  Patient Name: Cathy Curtis DOB: [birth year] MRN [id number]  Date of Service: 01/18/2022  Complaints/HPI: Pt is here for routine pulmonary follow up. She recently was setup on a new cpap machine and is here for a follow up. She has no complaints with her therapy and likes the new machine. Her download shows excellent compliance with apnea well controlled with an AHI of 0.4. She denies any dryness, SOB, or headaches. She cleans her machine and changes supplies regularly. Her BP was initially elevated in office, but improved on recheck and reports normally well controlled at home at 595-638 systolic.   ROS  General: (-) fever, (-) chills, (-) night sweats, (-) weakness Skin: (-) rashes, (-) itching,. Eyes: (-) visual changes, (-) redness, (-) itching. Nose and Sinuses: (-) nasal stuffiness or itchiness, (-) postnasal drip, (-) nosebleeds, (-) sinus trouble. Mouth and Throat: (-) sore throat, (-) hoarseness. Neck: (-) swollen glands, (-) enlarged thyroid, (-) neck pain. Respiratory: - cough, (-) bloody sputum, - shortness of breath, - wheezing. Cardiovascular: - ankle swelling, (-) chest pain. Lymphatic: (-) lymph node enlargement. Neurologic: (-) numbness, (-) tingling. Psychiatric: (-) anxiety, (-) depression   Current Medication: Outpatient Encounter Medications as of 01/12/2022  Medication Sig   acetaminophen (TYLENOL) 500 MG tablet Take 500 mg by mouth every 6 (six) hours as needed.   amLODipine (NORVASC) 5 MG tablet Take 5 mg by mouth daily.   apixaban (ELIQUIS) 5 MG TABS tablet Take 1 tablet (5 mg total) by mouth 2 (two) times daily.   atorvastatin (LIPITOR) 10 MG tablet TAKE 1 TABLET BY MOUTH ONCE DAILY   Calcium Citrate-Vitamin D 200-250 MG-UNIT TABS Take 600 mg by mouth.    celecoxib (CELEBREX) 200 MG capsule TAKE (1) CAPSULE BY MOUTH ONCE DAILY.   Cholecalciferol  (VITAMIN D-3) 1000 UNITS CAPS Take by mouth daily.   Coenzyme Q10 (CO Q-10) 100 MG CAPS Take 200 mg by mouth daily.    metoprolol tartrate (LOPRESSOR) 25 MG tablet Take 25 mg by mouth 2 (two) times daily.   omeprazole (PRILOSEC) 40 MG capsule TAKE (1) CAPSULE BY MOUTH ONCE DAILY.   oxybutynin (DITROPAN XL) 15 MG 24 hr tablet TAKE ONE TABLET BY MOUTH AT BEDTIME.   telmisartan (MICARDIS) 80 MG tablet    zolpidem (AMBIEN) 10 MG tablet Take 1 tablet (10 mg total) by mouth at bedtime as needed. for sleep   No facility-administered encounter medications on file as of 01/12/2022.    Surgical History: Past Surgical History:  Procedure Laterality Date   ABDOMINAL HYSTERECTOMY     CARDIOVERSION  2009, 2010   CHOLECYSTECTOMY     COLONOSCOPY  2010,02/2014   Dr. Jamal Collin, [place] WITH PROPOFOL N/A 11/14/2019   Procedure: COLONOSCOPY WITH PROPOFOL;  Surgeon: Robert Bellow, MD;  Location: [facility] ENDOSCOPY;  Service: Endoscopy;  Laterality: N/A;   HEMORRHOID SURGERY  09-06-2007   stapled   INSERT / REPLACE / REMOVE PACEMAKER     PACEMAKER INSERTION Left 09/2011   SALPINGOOPHORECTOMY  1974    Medical History: Past Medical History:  Diagnosis Date   Atrial fibrillation ([place]) 2016   Atrial flutter ([place]) 2009   Breast screening, unspecified    Diffuse cystic mastopathy    FCD   Family history of malignant neoplasm of gastrointestinal tract    Heart disease    Hemorrhoids 2009   resolved  Hiatal hernia    Hyperlipidemia    Obesity, unspecified    Osteoarthritis    Osteoporosis    Personal history of tobacco use, presenting hazards to health    Screening for obesity    Sleep apnea    admits to c-pap   Special screening for malignant neoplasms, colon    Unspecified essential hypertension 2002    Family History: Family History  Problem Relation Age of Onset   Colon cancer Father    Colon cancer Sister    Heart disease Sister    Colon cancer Sister    Stroke Mother     Colon cancer Nephew     Social History: Social History   Socioeconomic History   Marital status: Married    Spouse name: Not on file   Number of children: Not on file   Years of education: Not on file   Highest education level: Not on file  Occupational History   Not on file  Tobacco Use   Smoking status: Former    Packs/day: 1.00    Years: 20.00    Total pack years: 20.00    Types: Cigarettes    Quit date: 05/16/1991    Years since quitting: 30.6   Smokeless tobacco: Never  Vaping Use   Vaping Use: Never used  Substance and Sexual Activity   Alcohol use: Yes    Comment: rarely   Drug use: No   Sexual activity: Not on file  Other Topics Concern   Not on file  Social History Narrative   Not on file   Social Determinants of Health   Financial Resource Strain: Not on file  Food Insecurity: Not on file  Transportation Needs: Not on file  Physical Activity: Not on file  Stress: Not on file  Social Connections: Not on file  Intimate Partner Violence: Not on file    Vital Signs: Blood pressure 130/72, pulse 70, temperature 98.2 F (36.8 C), resp. rate 16, height '5\' 6"'$  (1.676 m), weight 201 lb 3.2 oz (91.3 kg), SpO2 93 %.  Examination: General Appearance: The patient is well-developed, well-nourished, and in no distress. Skin: Gross inspection of skin unremarkable. Head: normocephalic, no gross deformities. Eyes: no gross deformities noted. ENT: ears appear grossly normal no exudates. Neck: Supple. No thyromegaly. No LAD. Respiratory: Lungs clear to auscultation bilaterally. Cardiovascular: Normal S1 and S2 without murmur or rub. Extremities: No cyanosis. pulses are equal. Neurologic: Alert and oriented. No involuntary movements.  LABS: No results found for this or any previous visit (from the past 2160 hour(s)).  Radiology: No results found.  No results found.  No results found.    Assessment and Plan: Patient Active Problem List   Diagnosis Date  Noted   Depression, major, single episode, mild ([place]) 11/05/2020   Family history of colon cancer 10/23/2018   Hematuria 07/31/2018   Chronic constipation 07/01/2018   Urinary tract infection with hematuria 06/23/2018   Dysuria 06/23/2018   Encounter for general adult medical examination with abnormal findings 05/05/2018   Stress incontinence (female) (female) 05/05/2018   Primary insomnia 05/05/2018   Paroxysmal atrial fibrillation ([place]) 07/04/2017   GERD (gastroesophageal reflux disease) 05/01/2017   Sick sinus syndrome ([place]) 05/01/2017   Other fatigue 05/01/2017   Moderate mitral insufficiency 11/02/2016   Essential hypertension 03/01/2015   Cardiac pacemaker 03/01/2015   Hyperlipemia, mixed 03/06/2014   OSA on CPAP 03/06/2014   Diffuse cystic mastopathy 08/06/2012   Family history of malignant neoplasm of gastrointestinal tract 08/06/2012  1. OSA on CPAP Continue excellent compliance  2. CPAP use counseling CPAP couseling-Discussed importance of adequate CPAP use as well as proper care and cleaning techniques of machine and all supplies.  3. Essential hypertension Continue current medication and f/u with PCP.  4. Obesity (BMI 30.0-34.9) Obesity Counseling: Had a lengthy discussion regarding patients BMI and weight issues. Patient was instructed on portion control as well as increased activity. Also discussed caloric restrictions with trying to maintain intake less than 2000 Kcal. Discussions were made in accordance with the 5As of weight management. Simple actions such as not eating late and if able to, taking a walk is suggested.    General Counseling: I have discussed the findings of the evaluation and examination with Tomi Bamberger.  I have also discussed any further diagnostic evaluation thatmay be needed or ordered today. Kamy verbalizes understanding of the findings of todays visit. We also reviewed her medications today and discussed drug interactions and side effects  including but not limited excessive drowsiness and altered mental states. We also discussed that there is always a risk not just to her but also people around her. she has been encouraged to call the office with any questions or concerns that should arise related to todays visit.  No orders of the defined types were placed in this encounter.    Time spent: 30  I have personally obtained a history, examined the patient, evaluated laboratory and imaging results, formulated the assessment and plan and placed orders. This patient was seen by Drema Dallas, PA-C in collaboration with Dr. Devona Konig as a part of collaborative care agreement.     Allyne Gee, MD [facility] Pulmonary and Critical Care Sleep medicine

## 2022-01-18 ENCOUNTER — Ambulatory Visit
Admission: RE | Admit: 2022-01-18 | Discharge: 2022-01-18 | Disposition: A | Discharge status: Home / Self Care | Payer: Medicare PPO | Source: Ambulatory Visit | Attending: Nurse Practitioner | Admitting: Nurse Practitioner

## 2022-01-18 DIAGNOSIS — Z1231 Encounter for screening mammogram for malignant neoplasm of breast: Secondary | ICD-10-CM | POA: Insufficient documentation

## 2022-01-18 NOTE — Patient Instructions (Signed)

## 2022-01-19 ENCOUNTER — Inpatient Hospital Stay
Admission: RE | Admit: 2022-01-19 | Discharge: 2022-01-19 | Disposition: A | Discharge status: Home / Self Care | Payer: Self-pay | Source: Ambulatory Visit | Attending: *Deleted | Admitting: *Deleted

## 2022-01-19 ENCOUNTER — Other Ambulatory Visit: Payer: Self-pay | Admitting: *Deleted

## 2022-01-19 DIAGNOSIS — Z1231 Encounter for screening mammogram for malignant neoplasm of breast: Secondary | ICD-10-CM

## 2022-02-02 ENCOUNTER — Encounter: Payer: Self-pay | Admitting: Emergency Medicine

## 2022-02-02 ENCOUNTER — Ambulatory Visit
Admission: EM | Admit: 2022-02-02 | Discharge: 2022-02-02 | Disposition: A | Discharge status: Home / Self Care | Payer: Medicare PPO | Attending: Urgent Care | Admitting: Urgent Care

## 2022-02-02 DIAGNOSIS — L237 Allergic contact dermatitis due to plants, except food: Secondary | ICD-10-CM | POA: Diagnosis not present

## 2022-02-02 MED ORDER — PREDNISONE 10 MG PO TABS: ORAL_TABLET | ORAL | 0 refills | Status: DC

## 2022-02-02 NOTE — Discharge Instructions (Addendum)
Follow-up here or with your primary care provider if symptoms do not resolve with treatment.

## 2022-02-02 NOTE — ED Triage Notes (Signed)
Patient c/o generalized rash x 1 week.   Patient denies pain.   Patient endorses onset of symptoms began after " falling into some poison ivy in the yard".   Patient endorses itching.   Patient has used cortisone with no relief of symptoms.

## 2022-02-02 NOTE — ED Provider Notes (Signed)
[facility]    CSN: [id number] Arrival date & time: 02/02/22  1158      History   Chief Complaint Chief Complaint  Patient presents with   Rash    HPI Cathy Curtis is a 81 y.o. female.    Rash   Accompanied by her husband, presents to urgent care with complaint of rash on her left lower leg, right lower leg and right wrist x1 week.  She denies pain but endorses severe itching.  Patient says she fell in her garden into some poison ivy about a week ago.  She has tried cortisone but with no relief of symptoms.  Past Medical History:  Diagnosis Date   Atrial fibrillation ([place]) 2016   Atrial flutter ([place]) 2009   Breast screening, unspecified    Diffuse cystic mastopathy    FCD   Family history of malignant neoplasm of gastrointestinal tract    Heart disease    Hemorrhoids 2009   resolved   Hiatal hernia    Hyperlipidemia    Obesity, unspecified    Osteoarthritis    Osteoporosis    Personal history of tobacco use, presenting hazards to health    Screening for obesity    Sleep apnea    admits to c-pap   Special screening for malignant neoplasms, colon    Unspecified essential hypertension 2002    Patient Active Problem List   Diagnosis Date Noted   Depression, major, single episode, mild ([place]) 11/05/2020   Family history of colon cancer 10/23/2018   Hematuria 07/31/2018   Chronic constipation 07/01/2018   Urinary tract infection with hematuria 06/23/2018   Dysuria 06/23/2018   Encounter for general adult medical examination with abnormal findings 05/05/2018   Stress incontinence (female) (female) 05/05/2018   Primary insomnia 05/05/2018   Paroxysmal atrial fibrillation ([place]) 07/04/2017   GERD (gastroesophageal reflux disease) 05/01/2017   Sick sinus syndrome ([place]) 05/01/2017   Other fatigue 05/01/2017   Moderate mitral insufficiency 11/02/2016   Essential hypertension 03/01/2015   Cardiac pacemaker 03/01/2015   Hyperlipemia, mixed 03/06/2014    OSA on CPAP 03/06/2014   Diffuse cystic mastopathy 08/06/2012   Family history of malignant neoplasm of gastrointestinal tract 08/06/2012    Past Surgical History:  Procedure Laterality Date   ABDOMINAL HYSTERECTOMY     CARDIOVERSION  2009, 2010   CHOLECYSTECTOMY     COLONOSCOPY  2010,02/2014   Dr. Jamal Collin, [facility]   COLONOSCOPY WITH PROPOFOL N/A 11/14/2019   Procedure: COLONOSCOPY WITH PROPOFOL;  Surgeon: Robert Bellow, MD;  Location: [facility] ENDOSCOPY;  Service: Endoscopy;  Laterality: N/A;   HEMORRHOID SURGERY  09-06-2007   stapled   INSERT / REPLACE / REMOVE PACEMAKER     PACEMAKER INSERTION Left 09/2011   SALPINGOOPHORECTOMY  1974    OB History     Gravida  0   Para  0   Term      Preterm      AB      Living         SAB      IAB      Ectopic      Multiple      Live Births           Obstetric Comments  Age first menstrual cycle 15          Home Medications    Prior to Admission medications   Medication Sig Start Date End Date Taking? Authorizing Provider  amLODipine (NORVASC) 5 MG tablet  Take 5 mg by mouth daily.   Yes [provider]  apixaban (ELIQUIS) 5 MG TABS tablet Take 1 tablet (5 mg total) by mouth 2 (two) times daily. 11/16/19  Yes Byrnett, Forest Gleason, MD  atorvastatin (LIPITOR) 10 MG tablet TAKE 1 TABLET BY MOUTH ONCE DAILY 11/21/21  Yes Abernathy, Yetta Flock, NP  Calcium Citrate-Vitamin D 200-250 MG-UNIT TABS Take 600 mg by mouth.    Yes [provider]  Cholecalciferol (VITAMIN D-3) 1000 UNITS CAPS Take by mouth daily.   Yes [provider]  Coenzyme Q10 (CO Q-10) 100 MG CAPS Take 200 mg by mouth daily.    Yes [provider]  metoprolol tartrate (LOPRESSOR) 25 MG tablet Take 25 mg by mouth 2 (two) times daily.   Yes [provider]  omeprazole (PRILOSEC) 40 MG capsule TAKE (1) CAPSULE BY MOUTH ONCE DAILY. 02/28/21  Yes Abernathy, Yetta Flock, NP  oxybutynin (DITROPAN XL) 15 MG 24 hr tablet TAKE ONE TABLET  BY MOUTH AT BEDTIME. 10/17/21  Yes Lavera Guise, MD  telmisartan (MICARDIS) 80 MG tablet  10/28/18  Yes [provider]  zolpidem (AMBIEN) 10 MG tablet Take 1 tablet (10 mg total) by mouth at bedtime as needed. for sleep 11/09/21  Yes Abernathy, Yetta Flock, NP  acetaminophen (TYLENOL) 500 MG tablet Take 500 mg by mouth every 6 (six) hours as needed.    [provider]  celecoxib (CELEBREX) 200 MG capsule TAKE (1) CAPSULE BY MOUTH ONCE DAILY. 07/04/21   Jonetta Osgood, NP    Family History Family History  Problem Relation Age of Onset   Colon cancer Father    Colon cancer Sister    Heart disease Sister    Colon cancer Sister    Stroke Mother    Colon cancer Nephew     Social History Social History   Tobacco Use   Smoking status: Former    Packs/day: 1.00    Years: 20.00    Total pack years: 20.00    Types: Cigarettes    Quit date: 05/16/1991    Years since quitting: 30.7   Smokeless tobacco: Never  Vaping Use   Vaping Use: Never used  Substance Use Topics   Alcohol use: Yes    Comment: rarely   Drug use: No     Allergies   Prevacid [lansoprazole]   Review of Systems Review of Systems  Skin:  Positive for rash.     Physical Exam Triage Vital Signs ED Triage Vitals  Enc Vitals Group     BP 02/02/22 1221 (!) 155/80     Pulse Rate 02/02/22 1221 68     Resp 02/02/22 1221 16     Temp 02/02/22 1221 98.2 F (36.8 C)     Temp Source 02/02/22 1221 Oral     SpO2 02/02/22 1221 95 %     Weight --      Height --      Head Circumference --      Peak Flow --      Pain Score 02/02/22 1216 0     Pain Loc --      Pain Edu? --      Excl. in [place]? --    No data found.  Updated Vital Signs BP (!) 155/80 (BP Location: Right Arm)   Pulse 68   Temp 98.2 F (36.8 C) (Oral)   Resp 16   SpO2 95%   Visual Acuity Right Eye Distance:   Left Eye Distance:  Bilateral Distance:    Right Eye Near:   Left Eye Near:    Bilateral Near:     Physical  Exam Vitals reviewed.  Constitutional:      General: She is not in acute distress.    Appearance: Normal appearance.  Musculoskeletal:       Arms:       Legs:  Skin:    General: Skin is warm.     Findings: Erythema and rash present.  Neurological:     General: No focal deficit present.     Mental Status: She is alert and oriented to person, place, and time.  Psychiatric:        Mood and Affect: Mood normal.        Behavior: Behavior normal.      UC Treatments / Results  Labs (all labs ordered are listed, but only abnormal results are displayed) Labs Reviewed - No data to display  EKG   Radiology No results found.  Procedures Procedures (including critical care time)  Medications Ordered in UC Medications - No data to display  Initial Impression / Assessment and Plan / UC Course  I have reviewed the triage vital signs and the nursing notes.  Pertinent labs & imaging results that were available during my care of the patient were reviewed by me and considered in my medical decision making (see chart for details).   Poison ivy dermatitis.  Will treat rash with oral steroid.  Patient states she is not treated for diabetes.  Has never used oral steroid before.  Reviewed side effects of steroids and asked her to stop taking if she develops anxiety.  Advised her to take medication first thing in the morning to avoid nighttime sleep disturbance.  Patient states she routinely uses a "sleep tablet".   Final Clinical Impressions(s) / UC Diagnoses   Final diagnoses:  None   Discharge Instructions   None    ED Prescriptions   None    PDMP not reviewed this encounter.   Rose Phi, [place] 02/02/22 1249

## 2022-02-03 ENCOUNTER — Ambulatory Visit
Admission: EM | Admit: 2022-02-03 | Discharge: 2022-02-03 | Disposition: A | Discharge status: Home / Self Care | Payer: Medicare PPO | Attending: Urgent Care | Admitting: Urgent Care

## 2022-02-03 ENCOUNTER — Telehealth: Payer: Self-pay

## 2022-02-03 DIAGNOSIS — Z7901 Long term (current) use of anticoagulants: Secondary | ICD-10-CM | POA: Diagnosis not present

## 2022-02-03 DIAGNOSIS — L299 Pruritus, unspecified: Secondary | ICD-10-CM | POA: Diagnosis not present

## 2022-02-03 DIAGNOSIS — R21 Rash and other nonspecific skin eruption: Secondary | ICD-10-CM | POA: Diagnosis not present

## 2022-02-03 DIAGNOSIS — I4891 Unspecified atrial fibrillation: Secondary | ICD-10-CM | POA: Diagnosis not present

## 2022-02-03 MED ORDER — PREDNISONE 20 MG PO TABS: ORAL_TABLET | ORAL | 0 refills | Status: DC

## 2022-02-03 MED ORDER — HYDROXYZINE HCL 25 MG PO TABS: 12.5000 mg | ORAL_TABLET | Freq: Three times a day (TID) | ORAL | 0 refills | Status: DC | PRN

## 2022-02-03 NOTE — Telephone Encounter (Signed)
Pt called that she went urgent care for poison oak and they gave her prednisone she is having rash all over the body as per lauren advised her she need stopped prednisone go back to urgent care  or called them

## 2022-02-03 NOTE — ED Provider Notes (Signed)
[facility] [place]  Note:  This document was prepared using Systems analyst and may include unintentional dictation errors.  MRN: [id number] DOB: 1940/10/13  Subjective:   Cathy Curtis is a 81 y.o. female presenting for suffering a fall 11 days ago into a brush that she suspected contained a lot of poison ivy.  Patient states she was wearing a long sleeve and pants.  Thereafter she developed a rash over the leg that extended upwards toward the right hip area.  Initially this area felt feverish but has since calm down.  Then she started to have lesions on the wrists.  The rash has itched throughout.  It did not resolve and therefore patient came in to be seen yesterday especially given concerns that she has significant sensitivity to poison ivy.  She started an oral prednisone course for suspected poison ivy dermatitis.  She took 40 mg this morning and became concerned that she noticed another kind of rash over the chest, neck and right flank side.  No swelling, tenderness, warmth, nausea, vomiting, abdominal pain, drainage of pus or bleeding.  Patient is on Eliquis.  Has a history of atrial fibrillation, sick sinus syndrome.  Has pacemaker status.  No current facility-administered medications for this encounter.  Current Outpatient Medications:    hydrOXYzine (ATARAX) 25 MG tablet, Take 0.5 tablets (12.5 mg total) by mouth every 8 (eight) hours as needed for itching., Disp: 30 tablet, Rfl: 0   predniSONE (DELTASONE) 20 MG tablet, Day 1-5: Take 3 tablets daily. Day 6-10: Take 2 tablets daily. Day 11-15: Take 1 tablet daily. Take tablets with breakfast., Disp: 30 tablet, Rfl: 0   acetaminophen (TYLENOL) 500 MG tablet, Take 500 mg by mouth every 6 (six) hours as needed., Disp: , Rfl:    amLODipine (NORVASC) 5 MG tablet, Take 5 mg by mouth daily., Disp: , Rfl:    apixaban (ELIQUIS) 5 MG TABS tablet, Take 1 tablet (5 mg total) by mouth 2 (two) times daily., Disp: 60 tablet,  Rfl: 0   atorvastatin (LIPITOR) 10 MG tablet, TAKE 1 TABLET BY MOUTH ONCE DAILY, Disp: 90 tablet, Rfl: 1   Calcium Citrate-Vitamin D 200-250 MG-UNIT TABS, Take 600 mg by mouth. , Disp: , Rfl:    celecoxib (CELEBREX) 200 MG capsule, TAKE (1) CAPSULE BY MOUTH ONCE DAILY., Disp: 30 capsule, Rfl: 11   Cholecalciferol (VITAMIN D-3) 1000 UNITS CAPS, Take by mouth daily., Disp: , Rfl:    Coenzyme Q10 (CO Q-10) 100 MG CAPS, Take 200 mg by mouth daily. , Disp: , Rfl:    metoprolol tartrate (LOPRESSOR) 25 MG tablet, Take 25 mg by mouth 2 (two) times daily., Disp: , Rfl:    omeprazole (PRILOSEC) 40 MG capsule, TAKE (1) CAPSULE BY MOUTH ONCE DAILY., Disp: 90 capsule, Rfl: 3   oxybutynin (DITROPAN XL) 15 MG 24 hr tablet, TAKE ONE TABLET BY MOUTH AT BEDTIME., Disp: 90 tablet, Rfl: 1   telmisartan (MICARDIS) 80 MG tablet, , Disp: , Rfl:    zolpidem (AMBIEN) 10 MG tablet, Take 1 tablet (10 mg total) by mouth at bedtime as needed. for sleep, Disp: 30 tablet, Rfl: 1   Allergies  Allergen Reactions   Prevacid [Lansoprazole] Other (See Comments)    whelps    Past Medical History:  Diagnosis Date   Atrial fibrillation ([place]) 2016   Atrial flutter ([place]) 2009   Breast screening, unspecified    Diffuse cystic mastopathy    FCD   Family history of malignant neoplasm  of gastrointestinal tract    Heart disease    Hemorrhoids 2009   resolved   Hiatal hernia    Hyperlipidemia    Obesity, unspecified    Osteoarthritis    Osteoporosis    Personal history of tobacco use, presenting hazards to health    Screening for obesity    Sleep apnea    admits to c-pap   Special screening for malignant neoplasms, colon    Unspecified essential hypertension 2002     Past Surgical History:  Procedure Laterality Date   ABDOMINAL HYSTERECTOMY     CARDIOVERSION  2009, 2010   CHOLECYSTECTOMY     COLONOSCOPY  2010,02/2014   Dr. Jamal Collin, [facility]   COLONOSCOPY WITH PROPOFOL N/A 11/14/2019   Procedure: COLONOSCOPY WITH  PROPOFOL;  Surgeon: Robert Bellow, MD;  Location: [facility] ENDOSCOPY;  Service: Endoscopy;  Laterality: N/A;   HEMORRHOID SURGERY  09-06-2007   stapled   INSERT / REPLACE / REMOVE PACEMAKER     PACEMAKER INSERTION Left 09/2011   SALPINGOOPHORECTOMY  1974    Family History  Problem Relation Age of Onset   Colon cancer Father    Colon cancer Sister    Heart disease Sister    Colon cancer Sister    Stroke Mother    Colon cancer Nephew     Social History   Tobacco Use   Smoking status: Former    Packs/day: 1.00    Years: 20.00    Total pack years: 20.00    Types: Cigarettes    Quit date: 05/16/1991    Years since quitting: 30.7   Smokeless tobacco: Never  Vaping Use   Vaping Use: Never used  Substance Use Topics   Alcohol use: Yes    Comment: rarely   Drug use: No    ROS   Objective:   Vitals: BP (!) 162/82   Pulse 82   Temp 98.2 F (36.8 C)   Resp 18   Ht '5\' 6"'$  (1.676 m)   Wt 197 lb (89.4 kg)   SpO2 93%   BMI 31.80 kg/m   Physical Exam Constitutional:      General: She is not in acute distress.    Appearance: Normal appearance. She is well-developed. She is not ill-appearing, toxic-appearing or diaphoretic.  HENT:     Head: Normocephalic and atraumatic.     Nose: Nose normal.     Mouth/Throat:     Mouth: Mucous membranes are moist.  Eyes:     General: No scleral icterus.       Right eye: No discharge.        Left eye: No discharge.     Extraocular Movements: Extraocular movements intact.  Cardiovascular:     Rate and Rhythm: Normal rate and regular rhythm.     Heart sounds: Normal heart sounds. No murmur heard.    No friction rub. No gallop.  Pulmonary:     Effort: Pulmonary effort is normal. No respiratory distress.     Breath sounds: No stridor. No wheezing, rhonchi or rales.  Chest:     Chest wall: No tenderness.  Skin:    General: Skin is warm and dry.     Findings: Rash (petechial macular lesions coalescing into a large patch over left leg  extending laterally toward the posterior knee; macular papular lesions over the chest and neck area, right flank side bordering the breast; refer to images) present.  Neurological:     General: No focal deficit present.  Mental Status: She is alert and oriented to person, place, and time.  Psychiatric:        Mood and Affect: Mood normal.        Behavior: Behavior normal.                Assessment and Plan :   PDMP not reviewed this encounter.  1. Rash and nonspecific skin eruption   2. Adequate anticoagulation on anticoagulant therapy   3. Atrial fibrillation, unspecified type ([place])   4. Itching     Rash has different appearances, appears petechial but has elements of vasculitis versus dermatitis.  I do not suspect a medication reaction and after careful discussion with Dr. Alphonzo Cruise I will be using a higher dose of prednisone for the same timeframe.  Adding hydroxyzine at 12.5 mg 3 times daily.  Creatinine clearance calculated out to 17m/min using the creatinine level from her most recent blood draw at the end of 2022.  We will pursue lab work. Counseled patient on potential for adverse effects with medications prescribed/recommended today, ER and return-to-clinic precautions discussed, patient verbalized understanding.    MJaynee Eagles PV[place]09/22/23 1331

## 2022-02-03 NOTE — Discharge Instructions (Addendum)
Please set aside the prescription for prednisone that you filled yesterday.  I would like to use a higher dosing of prednisone.  I am adding hydroxyzine for itching and antihistamine properties.  We will let you know about your blood test results when they come back to [place].  If your rash worsens or you develop swelling then please report to the emergency room for further testing and intervention.

## 2022-02-03 NOTE — ED Triage Notes (Signed)
Patient to Urgent Care for reevaluation of rash (bilateral shins).   Reports being seen on 9/21 after falling on some poison ivy in her yard, is being treated with prednisone- concerned about possible allergic reaction to prednisone. First dose this am. Rash now present to neck and chest.

## 2022-02-04 LAB — CBC WITH DIFFERENTIAL/PLATELET
Basophils Absolute: 0 10*3/uL (ref 0.0–0.2)
Basos: 0 %
EOS (ABSOLUTE): 0.1 10*3/uL (ref 0.0–0.4)
Eos: 1 %
Hematocrit: 44.4 % (ref 34.0–46.6)
Hemoglobin: 14.5 g/dL (ref 11.1–15.9)
Immature Grans (Abs): 0 10*3/uL (ref 0.0–0.1)
Immature Granulocytes: 0 %
Lymphocytes Absolute: 1 10*3/uL (ref 0.7–3.1)
Lymphs: 12 %
MCH: 29.9 pg (ref 26.6–33.0)
MCHC: 32.7 g/dL (ref 31.5–35.7)
MCV: 92 fL (ref 79–97)
Monocytes Absolute: 0.1 10*3/uL (ref 0.1–0.9)
Monocytes: 1 %
Neutrophils Absolute: 6.8 10*3/uL (ref 1.4–7.0)
Neutrophils: 86 %
Platelets: 198 10*3/uL (ref 150–450)
RBC: 4.85 x10E6/uL (ref 3.77–5.28)
RDW: 13.1 % (ref 11.7–15.4)
WBC: 8 10*3/uL (ref 3.4–10.8)

## 2022-02-04 LAB — COMPREHENSIVE METABOLIC PANEL
ALT: 8 IU/L (ref 0–32)
AST: 15 IU/L (ref 0–40)
Albumin/Globulin Ratio: 1.4 (ref 1.2–2.2)
Albumin: 4.6 g/dL (ref 3.8–4.8)
Alkaline Phosphatase: 85 IU/L (ref 44–121)
BUN/Creatinine Ratio: 12 (ref 12–28)
BUN: 12 mg/dL (ref 8–27)
Bilirubin Total: 0.4 mg/dL (ref 0.0–1.2)
CO2: 24 mmol/L (ref 20–29)
Calcium: 10.1 mg/dL (ref 8.7–10.3)
Chloride: 103 mmol/L (ref 96–106)
Creatinine, Ser: 0.97 mg/dL (ref 0.57–1.00)
Globulin, Total: 3.3 g/dL (ref 1.5–4.5)
Glucose: 139 mg/dL — ABNORMAL HIGH (ref 70–99)
Potassium: 4.4 mmol/L (ref 3.5–5.2)
Sodium: 141 mmol/L (ref 134–144)
Total Protein: 7.9 g/dL (ref 6.0–8.5)
eGFR: 59 mL/min/{1.73_m2} — ABNORMAL LOW (ref >59)

## 2022-02-04 LAB — SEDIMENTATION RATE: Sed Rate: 14 mm/hr (ref 0–40)

## 2022-02-27 ENCOUNTER — Other Ambulatory Visit: Payer: Self-pay

## 2022-02-27 DIAGNOSIS — K219 Gastro-esophageal reflux disease without esophagitis: Secondary | ICD-10-CM

## 2022-02-27 MED ORDER — OMEPRAZOLE 40 MG PO CPDR: DELAYED_RELEASE_CAPSULE | ORAL | 3 refills | Status: DC

## 2022-03-05 ENCOUNTER — Other Ambulatory Visit: Payer: Self-pay | Admitting: Nurse Practitioner

## 2022-03-05 DIAGNOSIS — F5101 Primary insomnia: Secondary | ICD-10-CM

## 2022-03-06 NOTE — Telephone Encounter (Signed)
Last here 8/23 and next 1/24

## 2022-03-08 DIAGNOSIS — I495 Sick sinus syndrome: Secondary | ICD-10-CM | POA: Diagnosis not present

## 2022-05-03 ENCOUNTER — Ambulatory Visit: Payer: Medicare PPO | Admitting: Nurse Practitioner

## 2022-05-23 ENCOUNTER — Ambulatory Visit: Payer: Medicare PPO | Admitting: Nurse Practitioner

## 2022-05-31 DIAGNOSIS — R0902 Hypoxemia: Secondary | ICD-10-CM | POA: Diagnosis not present

## 2022-05-31 DIAGNOSIS — G4733 Obstructive sleep apnea (adult) (pediatric): Secondary | ICD-10-CM | POA: Diagnosis not present

## 2022-06-02 ENCOUNTER — Ambulatory Visit: Payer: Medicare PPO | Admitting: Nurse Practitioner

## 2022-06-02 DIAGNOSIS — J449 Chronic obstructive pulmonary disease, unspecified: Secondary | ICD-10-CM | POA: Diagnosis not present

## 2022-06-05 DIAGNOSIS — H353131 Nonexudative age-related macular degeneration, bilateral, early dry stage: Secondary | ICD-10-CM | POA: Diagnosis not present

## 2022-06-05 DIAGNOSIS — H43811 Vitreous degeneration, right eye: Secondary | ICD-10-CM | POA: Diagnosis not present

## 2022-06-05 DIAGNOSIS — H2513 Age-related nuclear cataract, bilateral: Secondary | ICD-10-CM | POA: Diagnosis not present

## 2022-06-05 DIAGNOSIS — Z0100 Encounter for examination of eyes and vision without abnormal findings: Secondary | ICD-10-CM | POA: Diagnosis not present

## 2022-06-07 ENCOUNTER — Ambulatory Visit (INDEPENDENT_AMBULATORY_CARE_PROVIDER_SITE_OTHER): Payer: Medicare PPO | Admitting: Nurse Practitioner

## 2022-06-07 ENCOUNTER — Encounter: Payer: Self-pay | Admitting: Nurse Practitioner

## 2022-06-07 VITALS — BP 140/85 | HR 85 | Temp 98.4°F | Resp 16 | Ht 66.0 in | Wt 198.2 lb

## 2022-06-07 DIAGNOSIS — R300 Dysuria: Secondary | ICD-10-CM | POA: Diagnosis not present

## 2022-06-07 DIAGNOSIS — Z636 Dependent relative needing care at home: Secondary | ICD-10-CM | POA: Diagnosis not present

## 2022-06-07 DIAGNOSIS — E559 Vitamin D deficiency, unspecified: Secondary | ICD-10-CM | POA: Diagnosis not present

## 2022-06-07 DIAGNOSIS — Z0001 Encounter for general adult medical examination with abnormal findings: Secondary | ICD-10-CM | POA: Diagnosis not present

## 2022-06-07 DIAGNOSIS — L659 Nonscarring hair loss, unspecified: Secondary | ICD-10-CM

## 2022-06-07 DIAGNOSIS — E782 Mixed hyperlipidemia: Secondary | ICD-10-CM | POA: Diagnosis not present

## 2022-06-07 MED ORDER — CELECOXIB 200 MG PO CAPS: 200.0000 mg | ORAL_CAPSULE | Freq: Every day | ORAL | 11 refills | Status: DC

## 2022-06-07 MED ORDER — OXYBUTYNIN CHLORIDE ER 15 MG PO TB24: 15.0000 mg | ORAL_TABLET | Freq: Every day | ORAL | 1 refills | Status: DC

## 2022-06-07 MED ORDER — ATORVASTATIN CALCIUM 10 MG PO TABS: 10.0000 mg | ORAL_TABLET | Freq: Every day | ORAL | 1 refills | Status: DC

## 2022-06-07 MED ORDER — OMEPRAZOLE 40 MG PO CPDR: DELAYED_RELEASE_CAPSULE | ORAL | 3 refills | Status: DC

## 2022-06-07 NOTE — Progress Notes (Signed)
[facility] [place], [place] [address]  Internal MEDICINE  Office Visit Note  Patient Name: Cathy Curtis  [id number]  [id number]  Date of Service: 06/07/2022  Chief Complaint  Patient presents with   Medicare Wellness   Hyperlipidemia   Hypertension    HPI Cathy Curtis presents for an annual well visit and physical exam.  Well-appearing 82 y.o. female with hypertension, GERD, constipation, OSA, atrial fibrillation, stress incontinence, depression and fatigue.  Caregiver stress and Increased depression -- husband is not doing well, and patient is doing everything around the house, feeling depressed  Routine mammogram: normal in September -- discontinue routine screening at this point.  Labs: due for routine labs  New or worsening pain: none Other concerns: depression      06/07/2022   11:03 AM 05/11/2021   10:53 AM 05/06/2020   10:17 AM  MMSE - Mini Mental State Exam  Orientation to time '5 5 5  '$ Orientation to Place '5 5 5  '$ Registration '3 3 3  '$ Attention/ Calculation '5 5 5  '$ Recall '3 3 3  '$ Language- name 2 objects '2 2 2  '$ Language- repeat '1 1 1  '$ Language- follow 3 step command '3 3 3  '$ Language- read & follow direction '1 1 1  '$ Write a sentence '1 1 1  '$ Copy design '1 1 1  '$ Total score '30 30 30    '$ Functional Status Survey: Is the patient deaf or have difficulty hearing?: No Does the patient have difficulty seeing, even when wearing glasses/contacts?: No Does the patient have difficulty concentrating, remembering, or making decisions?: No Does the patient have difficulty walking or climbing stairs?: Yes Does the patient have difficulty dressing or bathing?: No Does the patient have difficulty doing errands alone such as visiting a doctor's office or shopping?: No     05/11/2021   10:51 AM 11/09/2021   11:55 AM 02/02/2022   12:20 PM 02/03/2022   12:49 PM 06/07/2022   10:59 AM  [place] in the past year? 0 0   1  Was there an injury with Fall?     0   Fall Risk Category Calculator     1  (RETIRED) Patient Fall Risk Level  Low fall risk Low fall risk Low fall risk   Patient at Risk for Falls Due to  No Fall Risks     Fall risk Follow up  Falls evaluation completed   Falls evaluation completed       06/07/2022   10:59 AM  Depression screen PHQ 2/9  Decreased Interest 2  Down, Depressed, Hopeless 3  PHQ - 2 Score 5  Altered sleeping 0  Tired, decreased energy 2  Change in appetite 3  Feeling bad or failure about yourself  0  Trouble concentrating 0  Moving slowly or fidgety/restless 0  Suicidal thoughts 0  PHQ-9 Score 10  Difficult doing work/chores Not difficult at all        Current Medication: Outpatient Encounter Medications as of 06/07/2022  Medication Sig   acetaminophen (TYLENOL) 500 MG tablet Take 500 mg by mouth every 6 (six) hours as needed.   amLODipine (NORVASC) 5 MG tablet Take 5 mg by mouth daily.   apixaban (ELIQUIS) 5 MG TABS tablet Take 1 tablet (5 mg total) by mouth 2 (two) times daily.   Calcium Citrate-Vitamin D 200-250 MG-UNIT TABS Take 600 mg by mouth.    Cholecalciferol (VITAMIN D-3) 1000 UNITS CAPS Take by mouth daily.  Coenzyme Q10 (CO Q-10) 100 MG CAPS Take 200 mg by mouth daily.    metoprolol tartrate (LOPRESSOR) 25 MG tablet Take 25 mg by mouth 2 (two) times daily.   telmisartan (MICARDIS) 80 MG tablet    zolpidem (AMBIEN) 10 MG tablet TAKE 1 TABLET BY MOUTH AT BEDTIME AS NEEDED FOR SLEEP   [DISCONTINUED] atorvastatin (LIPITOR) 10 MG tablet TAKE 1 TABLET BY MOUTH ONCE DAILY   [DISCONTINUED] celecoxib (CELEBREX) 200 MG capsule TAKE (1) CAPSULE BY MOUTH ONCE DAILY.   [DISCONTINUED] hydrOXYzine (ATARAX) 25 MG tablet Take 0.5 tablets (12.5 mg total) by mouth every 8 (eight) hours as needed for itching.   [DISCONTINUED] omeprazole (PRILOSEC) 40 MG capsule TAKE (1) CAPSULE BY MOUTH ONCE DAILY.   [DISCONTINUED] oxybutynin (DITROPAN XL) 15 MG 24 hr tablet TAKE ONE TABLET BY MOUTH AT BEDTIME.    [DISCONTINUED] predniSONE (DELTASONE) 20 MG tablet Day 1-5: Take 3 tablets daily. Day 6-10: Take 2 tablets daily. Day 11-15: Take 1 tablet daily. Take tablets with breakfast.   atorvastatin (LIPITOR) 10 MG tablet Take 1 tablet (10 mg total) by mouth daily.   celecoxib (CELEBREX) 200 MG capsule Take 1 capsule (200 mg total) by mouth daily.   omeprazole (PRILOSEC) 40 MG capsule TAKE (1) CAPSULE BY MOUTH ONCE DAILY.   oxybutynin (DITROPAN XL) 15 MG 24 hr tablet Take 1 tablet (15 mg total) by mouth at bedtime.   No facility-administered encounter medications on file as of 06/07/2022.    Surgical History: Past Surgical History:  Procedure Laterality Date   ABDOMINAL HYSTERECTOMY     CARDIOVERSION  2009, 2010   CHOLECYSTECTOMY     COLONOSCOPY  2010,02/2014   Dr. Jamal Curtis, [place] WITH PROPOFOL N/A 11/14/2019   Procedure: COLONOSCOPY WITH PROPOFOL;  Surgeon: Cathy Bellow, MD;  Location: [facility];  Service: Endoscopy;  Laterality: N/A;   HEMORRHOID SURGERY  09-06-2007   stapled   INSERT / REPLACE / REMOVE PACEMAKER     PACEMAKER INSERTION Left 09/2011   SALPINGOOPHORECTOMY  1974    Medical History: Past Medical History:  Diagnosis Date   Atrial fibrillation ([place]) 2016   Atrial flutter ([place]) 2009   Breast screening, unspecified    Diffuse cystic mastopathy    FCD   Family history of malignant neoplasm of gastrointestinal tract    Heart disease    Hemorrhoids 2009   resolved   Hiatal hernia    Hyperlipidemia    Obesity, unspecified    Osteoarthritis    Osteoporosis    Personal history of tobacco use, presenting hazards to health    Screening for obesity    Sleep apnea    admits to c-pap   Special screening for malignant neoplasms, colon    Unspecified essential hypertension 2002    Family History: Family History  Problem Relation Age of Onset   Colon cancer Father    Colon cancer Sister    Heart disease Sister    Colon cancer Sister    Stroke Mother     Colon cancer Nephew     Social History   Socioeconomic History   Marital status: Married    Spouse name: Not on file   Number of children: Not on file   Years of education: Not on file   Highest education level: Not on file  Occupational History   Not on file  Tobacco Use   Smoking status: Former    Packs/day: 1.00    Years: 20.00  Total pack years: 20.00    Types: Cigarettes    Quit date: 05/16/1991    Years since quitting: 31.0   Smokeless tobacco: Never  Vaping Use   Vaping Use: Never used  Substance and Sexual Activity   Alcohol use: Yes    Comment: rarely   Drug use: No   Sexual activity: Not on file  Other Topics Concern   Not on file  Social History Narrative   Not on file   Social Determinants of Health   Financial Resource Strain: Not on file  Food Insecurity: Not on file  Transportation Needs: Not on file  Physical Activity: Not on file  Stress: Not on file  Social Connections: Not on file  Intimate Partner Violence: Not on file      Review of Systems  Constitutional:  Negative for activity change, appetite change, chills, fatigue, fever and unexpected weight change.  HENT:  Negative for congestion, ear pain, postnasal drip, rhinorrhea, sneezing, sore throat and trouble swallowing.   Eyes: Negative.  Negative for redness.  Respiratory: Negative.  Negative for cough, chest tightness, shortness of breath and wheezing.   Cardiovascular: Negative.  Negative for chest pain and palpitations.  Gastrointestinal: Negative.  Negative for abdominal pain, blood in stool, constipation, diarrhea, nausea and vomiting.  Endocrine: Negative.   Genitourinary: Negative.  Negative for difficulty urinating, dysuria, frequency, hematuria and urgency.  Musculoskeletal:  Positive for arthralgias. Negative for back pain, joint swelling, myalgias and neck pain.  Skin: Negative.  Negative for rash and wound.  Allergic/Immunologic: Negative.  Negative for immunocompromised  state.  Neurological: Negative.  Negative for dizziness, tremors, seizures, numbness and headaches.  Hematological: Negative.  Negative for adenopathy. Does not bruise/bleed easily.  Psychiatric/Behavioral: Negative.  Negative for behavioral problems (Depression), self-injury, sleep disturbance and suicidal ideas. The patient is not nervous/anxious.     Vital Signs: BP (!) 140/85 Comment: 149/88  Pulse 85   Temp 98.4 F (36.9 C)   Resp 16   Ht '5\' 6"'$  (1.676 m)   Wt 198 lb 3.2 oz (89.9 kg)   SpO2 95%   BMI 31.99 kg/m    Physical Exam Vitals reviewed.  Constitutional:      General: She is awake. She is not in acute distress.    Appearance: Normal appearance. She is well-developed and well-groomed. She is obese. She is not ill-appearing or diaphoretic.  HENT:     Head: Normocephalic and atraumatic.     Right Ear: Tympanic membrane, ear canal and external ear normal.     Left Ear: Tympanic membrane, ear canal and external ear normal.     Nose: Nose normal. No congestion or rhinorrhea.     Mouth/Throat:     Mouth: Mucous membranes are moist.     Pharynx: Oropharynx is clear. Uvula midline. No oropharyngeal exudate or posterior oropharyngeal erythema.  Eyes:     General: Lids are normal. Vision grossly intact. Gaze aligned appropriately. No scleral icterus.       Right eye: No discharge.        Left eye: No discharge.     Extraocular Movements: Extraocular movements intact.     Conjunctiva/sclera: Conjunctivae normal.     Pupils: Pupils are equal, round, and reactive to light.     Funduscopic exam:    Right eye: Red reflex present.        Left eye: Red reflex present. Neck:     Thyroid: No thyromegaly.     Vascular: No JVD.  Trachea: Trachea and phonation normal. No tracheal deviation.  Cardiovascular:     Rate and Rhythm: Normal rate and regular rhythm.     Pulses: Normal pulses.     Heart sounds: Normal heart sounds, S1 normal and S2 normal. No murmur heard.    No  friction rub. No gallop.  Pulmonary:     Effort: Pulmonary effort is normal. No accessory muscle usage or respiratory distress.     Breath sounds: Normal breath sounds and air entry. No stridor. No wheezing or rales.  Chest:     Chest wall: No tenderness.  Abdominal:     General: Bowel sounds are normal. There is no distension.     Palpations: Abdomen is soft. There is no shifting dullness, fluid wave, mass or pulsatile mass.     Tenderness: There is no abdominal tenderness. There is no guarding or rebound.  Musculoskeletal:        General: No tenderness or deformity. Normal range of motion.     Cervical back: Normal range of motion and neck supple.  Lymphadenopathy:     Cervical: No cervical adenopathy.  Skin:    General: Skin is warm and dry.     Capillary Refill: Capillary refill takes less than 2 seconds.     Coloration: Skin is not pale.     Findings: No erythema or rash.  Neurological:     Mental Status: She is alert and oriented to person, place, and time.     Cranial Nerves: No cranial nerve deficit.     Motor: No abnormal muscle tone.     Coordination: Coordination normal.     Gait: Gait normal.     Deep Tendon Reflexes: Reflexes are normal and symmetric.  Psychiatric:        Mood and Affect: Mood and affect normal.        Behavior: Behavior normal. Behavior is cooperative.        Thought Content: Thought content normal.        Judgment: Judgment normal.        Assessment/Plan: 1. Encounter for routine adult health examination with abnormal findings Age-appropriate preventive screenings and vaccinations discussed, annual physical exam completed. Routine labs for health maintenance ordered, see below. Also medication refills ordered. Marland Kitchen PHM updated.  - CBC with Differential/Platelet - CMP14+EGFR - Lipid Profile - Vitamin D (25 hydroxy) - TSH + free T4 - oxybutynin (DITROPAN XL) 15 MG 24 hr tablet; Take 1 tablet (15 mg total) by mouth at bedtime.  Dispense: 90  tablet; Refill: 1 - omeprazole (PRILOSEC) 40 MG capsule; TAKE (1) CAPSULE BY MOUTH ONCE DAILY.  Dispense: 90 capsule; Refill: 3 - celecoxib (CELEBREX) 200 MG capsule; Take 1 capsule (200 mg total) by mouth daily.  Dispense: 30 capsule; Refill: 11 - atorvastatin (LIPITOR) 10 MG tablet; Take 1 tablet (10 mg total) by mouth daily.  Dispense: 90 tablet; Refill: 1  2. Hair loss Labs ordered for further evaluation  - CMP14+EGFR - Vitamin D (25 hydroxy) - TSH + free T4  3. Vitamin D deficiency Routine lab ordered - Vitamin D (25 hydroxy)  4. Mixed hyperlipidemia Routine labs ordered - CBC with Differential/Platelet - CMP14+EGFR - Lipid Profile - Vitamin D (25 hydroxy) - TSH + free T4  5. Dysuria Routine urinalysis done  - UA/M w/rflx Culture, Routine  6. Caregiver stress Sole caregiver for husband, increased stress which has been causing depressed mood, not effecting daily activities, not on any antidepressant medication, continue to monitor  General Counseling: etana beets understanding of the findings of todays visit and agrees with plan of treatment. I have discussed any further diagnostic evaluation that may be needed or ordered today. We also reviewed her medications today. she has been encouraged to call the office with any questions or concerns that should arise related to todays visit.    Orders Placed This Encounter  Procedures   CBC with Differential/Platelet   CMP14+EGFR   Lipid Profile   Vitamin D (25 hydroxy)   TSH + free T4   UA/M w/rflx Culture, Routine    Meds ordered this encounter  Medications   oxybutynin (DITROPAN XL) 15 MG 24 hr tablet    Sig: Take 1 tablet (15 mg total) by mouth at bedtime.    Dispense:  90 tablet    Refill:  1   omeprazole (PRILOSEC) 40 MG capsule    Sig: TAKE (1) CAPSULE BY MOUTH ONCE DAILY.    Dispense:  90 capsule    Refill:  3   celecoxib (CELEBREX) 200 MG capsule    Sig: Take 1 capsule (200 mg total) by mouth  daily.    Dispense:  30 capsule    Refill:  11   atorvastatin (LIPITOR) 10 MG tablet    Sig: Take 1 tablet (10 mg total) by mouth daily.    Dispense:  90 tablet    Refill:  1    Return in about 6 months (around 12/06/2022) for F/U, Zayvien Canning PCP.   Total time spent:30 Minutes Time spent includes review of chart, medications, test results, and follow up plan with the patient.   [place] Controlled Substance Database was reviewed by me.  This patient was seen by Jonetta Osgood, FNP-C in collaboration with Dr. Clayborn Bigness as a part of collaborative care agreement.  Oshay Stranahan R. Valetta Fuller, MSN, FNP-C Internal medicine

## 2022-06-09 DIAGNOSIS — Z0001 Encounter for general adult medical examination with abnormal findings: Secondary | ICD-10-CM | POA: Diagnosis not present

## 2022-06-09 DIAGNOSIS — E559 Vitamin D deficiency, unspecified: Secondary | ICD-10-CM | POA: Diagnosis not present

## 2022-06-09 DIAGNOSIS — E782 Mixed hyperlipidemia: Secondary | ICD-10-CM | POA: Diagnosis not present

## 2022-06-09 DIAGNOSIS — L659 Nonscarring hair loss, unspecified: Secondary | ICD-10-CM | POA: Diagnosis not present

## 2022-06-10 LAB — CMP14+EGFR
ALT: 10 IU/L (ref 0–32)
AST: 12 IU/L (ref 0–40)
Albumin/Globulin Ratio: 1.6 (ref 1.2–2.2)
Albumin: 4.2 g/dL (ref 3.8–4.8)
Alkaline Phosphatase: 73 IU/L (ref 44–121)
BUN/Creatinine Ratio: 23 (ref 12–28)
BUN: 27 mg/dL (ref 8–27)
Bilirubin Total: 0.6 mg/dL (ref 0.0–1.2)
CO2: 24 mmol/L (ref 20–29)
Calcium: 9.8 mg/dL (ref 8.7–10.3)
Chloride: 106 mmol/L (ref 96–106)
Creatinine, Ser: 1.17 mg/dL — ABNORMAL HIGH (ref 0.57–1.00)
Globulin, Total: 2.7 g/dL (ref 1.5–4.5)
Glucose: 112 mg/dL — ABNORMAL HIGH (ref 70–99)
Potassium: 4.5 mmol/L (ref 3.5–5.2)
Sodium: 143 mmol/L (ref 134–144)
Total Protein: 6.9 g/dL (ref 6.0–8.5)
eGFR: 47 mL/min/{1.73_m2} — ABNORMAL LOW (ref >59)

## 2022-06-10 LAB — CBC WITH DIFFERENTIAL/PLATELET
Basophils Absolute: 0.1 10*3/uL (ref 0.0–0.2)
Basos: 1 %
EOS (ABSOLUTE): 0.4 10*3/uL (ref 0.0–0.4)
Eos: 5 %
Hematocrit: 38.5 % (ref 34.0–46.6)
Hemoglobin: 12.8 g/dL (ref 11.1–15.9)
Immature Grans (Abs): 0 10*3/uL (ref 0.0–0.1)
Immature Granulocytes: 0 %
Lymphocytes Absolute: 2 10*3/uL (ref 0.7–3.1)
Lymphs: 29 %
MCH: 30.6 pg (ref 26.6–33.0)
MCHC: 33.2 g/dL (ref 31.5–35.7)
MCV: 92 fL (ref 79–97)
Monocytes Absolute: 0.6 10*3/uL (ref 0.1–0.9)
Monocytes: 8 %
Neutrophils Absolute: 4 10*3/uL (ref 1.4–7.0)
Neutrophils: 57 %
Platelets: 163 10*3/uL (ref 150–450)
RBC: 4.18 x10E6/uL (ref 3.77–5.28)
RDW: 13.1 % (ref 11.7–15.4)
WBC: 7 10*3/uL (ref 3.4–10.8)

## 2022-06-10 LAB — LIPID PANEL
Chol/HDL Ratio: 2.6 ratio (ref 0.0–4.4)
Cholesterol, Total: 142 mg/dL (ref 100–199)
HDL: 55 mg/dL (ref >39)
LDL Chol Calc (NIH): 68 mg/dL (ref 0–99)
Triglycerides: 102 mg/dL (ref 0–149)
VLDL Cholesterol Cal: 19 mg/dL (ref 5–40)

## 2022-06-10 LAB — TSH+FREE T4
Free T4: 1.29 ng/dL (ref 0.82–1.77)
TSH: 0.777 u[IU]/mL (ref 0.450–4.500)

## 2022-06-10 LAB — VITAMIN D 25 HYDROXY (VIT D DEFICIENCY, FRACTURES): Vit D, 25-Hydroxy: 61 ng/mL (ref 30.0–100.0)

## 2022-06-13 LAB — UA/M W/RFLX CULTURE, ROUTINE
Bilirubin, UA: NEGATIVE
Glucose, UA: NEGATIVE
Ketones, UA: NEGATIVE
Nitrite, UA: NEGATIVE
Protein,UA: NEGATIVE
RBC, UA: NEGATIVE
Specific Gravity, UA: 1.013 (ref 1.005–1.030)
Urobilinogen, Ur: 1 mg/dL (ref 0.2–1.0)
pH, UA: 6.5 (ref 5.0–7.5)

## 2022-06-13 LAB — MICROSCOPIC EXAMINATION
Casts: NONE SEEN /lpf
RBC, Urine: NONE SEEN /hpf (ref 0–2)

## 2022-06-13 LAB — URINE CULTURE, REFLEX

## 2022-06-28 ENCOUNTER — Telehealth: Payer: Self-pay | Admitting: Nurse Practitioner

## 2022-06-28 ENCOUNTER — Ambulatory Visit: Payer: Medicare PPO

## 2022-06-28 NOTE — Telephone Encounter (Signed)
Left vm and sent mychart message to confirm 07/05/22 appointment-Toni

## 2022-07-02 ENCOUNTER — Other Ambulatory Visit: Payer: Self-pay | Admitting: Nurse Practitioner

## 2022-07-02 DIAGNOSIS — F5101 Primary insomnia: Secondary | ICD-10-CM

## 2022-07-03 DIAGNOSIS — J449 Chronic obstructive pulmonary disease, unspecified: Secondary | ICD-10-CM | POA: Diagnosis not present

## 2022-07-05 ENCOUNTER — Ambulatory Visit (INDEPENDENT_AMBULATORY_CARE_PROVIDER_SITE_OTHER): Payer: Medicare PPO

## 2022-07-05 DIAGNOSIS — G4733 Obstructive sleep apnea (adult) (pediatric): Secondary | ICD-10-CM

## 2022-07-05 NOTE — Progress Notes (Signed)
95 percentile pressure 9   95th percentile leak 26.7   apnea index 0.6 /hr  apnea-hypopnea index  0.7 /hr   total days used  >4 hr 78 days  total days used <4 hr 2 days  Total compliance 87 percent  She is doing really good no problems or questions at this time.  Pt was seen by Claiborne Billings  RRT/RCP  from [facility]

## 2022-07-12 DIAGNOSIS — I495 Sick sinus syndrome: Secondary | ICD-10-CM | POA: Diagnosis not present

## 2022-07-12 DIAGNOSIS — I480 Paroxysmal atrial fibrillation: Secondary | ICD-10-CM | POA: Diagnosis not present

## 2022-07-13 ENCOUNTER — Ambulatory Visit: Payer: Medicare PPO | Admitting: Physician Assistant

## 2022-07-17 ENCOUNTER — Encounter: Payer: Self-pay | Admitting: Physician Assistant

## 2022-07-17 ENCOUNTER — Ambulatory Visit (INDEPENDENT_AMBULATORY_CARE_PROVIDER_SITE_OTHER): Payer: Medicare PPO | Admitting: Physician Assistant

## 2022-07-17 VITALS — BP 132/82 | HR 63 | Temp 98.2°F | Resp 16 | Ht 66.0 in | Wt 200.8 lb

## 2022-07-17 DIAGNOSIS — G4733 Obstructive sleep apnea (adult) (pediatric): Secondary | ICD-10-CM | POA: Diagnosis not present

## 2022-07-17 DIAGNOSIS — E669 Obesity, unspecified: Secondary | ICD-10-CM

## 2022-07-17 DIAGNOSIS — Z7189 Other specified counseling: Secondary | ICD-10-CM

## 2022-07-17 DIAGNOSIS — I10 Essential (primary) hypertension: Secondary | ICD-10-CM | POA: Diagnosis not present

## 2022-07-17 DIAGNOSIS — I480 Paroxysmal atrial fibrillation: Secondary | ICD-10-CM | POA: Diagnosis not present

## 2022-07-17 NOTE — Progress Notes (Signed)
[facility] [place], [place] [address]  Pulmonary Sleep Medicine   Office Visit Note  Patient Name: Cathy Curtis DOB: January 25, 1941 MRN [id number]  Date of Service: 07/17/2022  Complaints/HPI: Pt is here for routine pulmonary follow up for OSA on CPAP. Doing well on CPAP. Denies dryness, headaches, and SOB. She feels well rested after CPAP use and is benefiting from use. She is cleaning supplies and changing them regularly. She uses [employer] for her supplies. She has had her new machine for about a year now and likes it. Her download was reviewed and shows very good compliance and her apnea is well controlled.    95 percentile pressure 9  95th percentile leak 26.7  apnea index 0.6 /hr  apnea-hypopnea index  0.7 /hr  total days used  >4 hr 78 days  total days used <4 hr 2 days Total compliance 87 percent  ROS  General: (-) fever, (-) chills, (-) night sweats, (-) weakness Skin: (-) rashes, (-) itching,. Eyes: (-) visual changes, (-) redness, (-) itching. Nose and Sinuses: (-) nasal stuffiness or itchiness, (-) postnasal drip, (-) nosebleeds, (-) sinus trouble. Mouth and Throat: (-) sore throat, (-) hoarseness. Neck: (-) swollen glands, (-) enlarged thyroid, (-) neck pain. Respiratory: - cough, (-) bloody sputum, - shortness of breath, - wheezing. Cardiovascular: - ankle swelling, (-) chest pain. Lymphatic: (-) lymph node enlargement. Neurologic: (-) numbness, (-) tingling. Psychiatric: (-) anxiety, (-) depression   Current Medication: Outpatient Encounter Medications as of 07/17/2022  Medication Sig   acetaminophen (TYLENOL) 500 MG tablet Take 500 mg by mouth every 6 (six) hours as needed.   amLODipine (NORVASC) 5 MG tablet Take 5 mg by mouth daily.   apixaban (ELIQUIS) 5 MG TABS tablet Take 1 tablet (5 mg total) by mouth 2 (two) times daily.   atorvastatin (LIPITOR) 10 MG tablet Take 1 tablet (10 mg total) by mouth daily.   Calcium Citrate-Vitamin D  200-250 MG-UNIT TABS Take 600 mg by mouth.    celecoxib (CELEBREX) 200 MG capsule Take 1 capsule (200 mg total) by mouth daily.   Cholecalciferol (VITAMIN D-3) 1000 UNITS CAPS Take by mouth daily.   Coenzyme Q10 (CO Q-10) 100 MG CAPS Take 200 mg by mouth daily.    metoprolol tartrate (LOPRESSOR) 25 MG tablet Take 25 mg by mouth 2 (two) times daily.   omeprazole (PRILOSEC) 40 MG capsule TAKE (1) CAPSULE BY MOUTH ONCE DAILY.   oxybutynin (DITROPAN XL) 15 MG 24 hr tablet Take 1 tablet (15 mg total) by mouth at bedtime.   telmisartan (MICARDIS) 80 MG tablet    zolpidem (AMBIEN) 10 MG tablet TAKE 1 TABLET BY MOUTH AT BEDTIME AS NEEDED FOR SLEEP   No facility-administered encounter medications on file as of 07/17/2022.    Surgical History: Past Surgical History:  Procedure Laterality Date   ABDOMINAL HYSTERECTOMY     CARDIOVERSION  2009, 2010   CHOLECYSTECTOMY     COLONOSCOPY  2010,02/2014   Dr. Jamal Collin, [place] WITH PROPOFOL N/A 11/14/2019   Procedure: COLONOSCOPY WITH PROPOFOL;  Surgeon: Robert Bellow, MD;  Location: [facility] ENDOSCOPY;  Service: Endoscopy;  Laterality: N/A;   HEMORRHOID SURGERY  09-06-2007   stapled   INSERT / REPLACE / REMOVE PACEMAKER     PACEMAKER INSERTION Left 09/2011   SALPINGOOPHORECTOMY  1974    Medical History: Past Medical History:  Diagnosis Date   Atrial fibrillation ([place]) 2016   Atrial flutter ([place]) 2009  Breast screening, unspecified    Diffuse cystic mastopathy    FCD   Family history of malignant neoplasm of gastrointestinal tract    Heart disease    Hemorrhoids 2009   resolved   Hiatal hernia    Hyperlipidemia    Obesity, unspecified    Osteoarthritis    Osteoporosis    Personal history of tobacco use, presenting hazards to health    Screening for obesity    Sleep apnea    admits to c-pap   Special screening for malignant neoplasms, colon    Unspecified essential hypertension 2002    Family History: Family History  Problem  Relation Age of Onset   Colon cancer Father    Colon cancer Sister    Heart disease Sister    Colon cancer Sister    Stroke Mother    Colon cancer Nephew     Social History: Social History   Socioeconomic History   Marital status: Married    Spouse name: Not on file   Number of children: Not on file   Years of education: Not on file   Highest education level: Not on file  Occupational History   Not on file  Tobacco Use   Smoking status: Former    Packs/day: 1.00    Years: 20.00    Total pack years: 20.00    Types: Cigarettes    Quit date: 05/16/1991    Years since quitting: 31.1   Smokeless tobacco: Never  Vaping Use   Vaping Use: Never used  Substance and Sexual Activity   Alcohol use: Yes    Comment: rarely   Drug use: No   Sexual activity: Not on file  Other Topics Concern   Not on file  Social History Narrative   Not on file   Social Determinants of Health   Financial Resource Strain: Not on file  Food Insecurity: Not on file  Transportation Needs: Not on file  Physical Activity: Not on file  Stress: Not on file  Social Connections: Not on file  Intimate Partner Violence: Not on file    Vital Signs: Blood pressure 132/82, pulse 63, temperature 98.2 F (36.8 C), resp. rate 16, height '5\' 6"'$  (1.676 m), weight 200 lb 12.8 oz (91.1 kg), SpO2 93 %.  Examination: General Appearance: The patient is well-developed, well-nourished, and in no distress. Skin: Gross inspection of skin unremarkable. Head: normocephalic, no gross deformities. Eyes: no gross deformities noted. ENT: ears appear grossly normal no exudates. Neck: Supple. No thyromegaly. No LAD. Respiratory: Lungs clear to auscultation bilaterally. Cardiovascular: Normal S1 and S2 without murmur or rub. Extremities: No cyanosis. pulses are equal. Neurologic: Alert and oriented. No involuntary movements.  LABS: Recent Results (from the past 2160 hour(s))  UA/M w/rflx Culture, Routine     Status:  Abnormal   Collection Time: 06/07/22  4:13 PM   Specimen: Urine   Urine  Result Value Ref Range   Specific Gravity, UA 1.013 1.005 - 1.030   pH, UA 6.5 5.0 - 7.5   Color, UA Yellow Yellow   Appearance Ur Cloudy (A) Clear   Leukocytes,UA 1+ (A) Negative   Protein,UA Negative Negative/Trace   Glucose, UA Negative Negative   Ketones, UA Negative Negative   RBC, UA Negative Negative   Bilirubin, UA Negative Negative   Urobilinogen, Ur 1.0 0.2 - 1.0 mg/dL   Nitrite, UA Negative Negative   Microscopic Examination See below:     Comment: Microscopic was indicated and was performed.  Urinalysis Reflex Comment     Comment: This specimen has reflexed to a Urine Culture.  Microscopic Examination     Status: Abnormal   Collection Time: 06/07/22  4:13 PM   Urine  Result Value Ref Range   WBC, UA 0-5 0 - 5 /hpf   RBC, Urine None seen 0 - 2 /hpf   Epithelial Cells (non renal) 0-10 0 - 10 /hpf   Casts None seen None seen /lpf   Bacteria, UA Many (A) None seen/Few  Urine Culture, Reflex     Status: Abnormal   Collection Time: 06/07/22  4:13 PM   Urine  Result Value Ref Range   Urine Culture, Routine Final report (A)    Organism ID, Bacteria Klebsiella pneumoniae (A)     Comment: Cefazolin <=4 ug/mL Cefazolin with an MIC <=16 predicts susceptibility to the oral agents cefaclor, cefdinir, cefpodoxime, cefprozil, cefuroxime, cephalexin, and loracarbef when used for therapy of uncomplicated urinary tract infections due to E. coli, Klebsiella pneumoniae, and Proteus mirabilis. Greater than 100,000 colony forming units per mL    ORGANISM ID, BACTERIA Klebsiella oxytoca (A)     Comment: Multi-Drug Resistant Organism   Antimicrobial Susceptibility Comment     Comment:       ** S = Susceptible; I = Intermediate; R = Resistant **                    P = Positive; N = Negative             MICS are expressed in micrograms per mL    Antibiotic                 RSLT#1    RSLT#2    RSLT#3     RSLT#4 Amoxicillin/Clavulanic Acid    S         S Ampicillin                     R         R Cefepime                       S         S Ceftriaxone                    S         S Cefuroxime                     S         S Ciprofloxacin                  S         S Ertapenem                      S Gentamicin                     S         S Imipenem                       S         S Levofloxacin                   S         S Meropenem  S         S Nitrofurantoin                 S         I Piperacillin/Tazobactam        S Tetracycline                   R         R Tobramycin                     S         S Trimethoprim/Sulfa             S         S   CBC with Differential/Platelet     Status: None   Collection Time: 06/09/22  9:06 AM  Result Value Ref Range   WBC 7.0 3.4 - 10.8 x10E3/uL   RBC 4.18 3.77 - 5.28 x10E6/uL   Hemoglobin 12.8 11.1 - 15.9 g/dL   Hematocrit 38.5 34.0 - 46.6 %   MCV 92 79 - 97 fL   MCH 30.6 26.6 - 33.0 pg   MCHC 33.2 31.5 - 35.7 g/dL   RDW 13.1 11.7 - 15.4 %   Platelets 163 150 - 450 x10E3/uL   Neutrophils 57 Not Estab. %   Lymphs 29 Not Estab. %   Monocytes 8 Not Estab. %   Eos 5 Not Estab. %   Basos 1 Not Estab. %   Neutrophils Absolute 4.0 1.4 - 7.0 x10E3/uL   Lymphocytes Absolute 2.0 0.7 - 3.1 x10E3/uL   Monocytes Absolute 0.6 0.1 - 0.9 x10E3/uL   EOS (ABSOLUTE) 0.4 0.0 - 0.4 x10E3/uL   Basophils Absolute 0.1 0.0 - 0.2 x10E3/uL   Immature Granulocytes 0 Not Estab. %   Immature Grans (Abs) 0.0 0.0 - 0.1 x10E3/uL  CMP14+EGFR     Status: Abnormal   Collection Time: 06/09/22  9:06 AM  Result Value Ref Range   Glucose 112 (H) 70 - 99 mg/dL   BUN 27 8 - 27 mg/dL   Creatinine, Ser 1.17 (H) 0.57 - 1.00 mg/dL   eGFR 47 (L) >59 mL/min/1.73   BUN/Creatinine Ratio 23 12 - 28   Sodium 143 134 - 144 mmol/L   Potassium 4.5 3.5 - 5.2 mmol/L   Chloride 106 96 - 106 mmol/L   CO2 24 20 - 29 mmol/L   Calcium 9.8 8.7 - 10.3 mg/dL   Total  Protein 6.9 6.0 - 8.5 g/dL   Albumin 4.2 3.8 - 4.8 g/dL   Globulin, Total 2.7 1.5 - 4.5 g/dL   Albumin/Globulin Ratio 1.6 1.2 - 2.2   Bilirubin Total 0.6 0.0 - 1.2 mg/dL   Alkaline Phosphatase 73 44 - 121 IU/L   AST 12 0 - 40 IU/L   ALT 10 0 - 32 IU/L  Lipid Profile     Status: None   Collection Time: 06/09/22  9:06 AM  Result Value Ref Range   Cholesterol, Total 142 100 - 199 mg/dL   Triglycerides 102 0 - 149 mg/dL   HDL 55 >39 mg/dL   VLDL Cholesterol Cal 19 5 - 40 mg/dL   LDL Chol Calc (NIH) 68 0 - 99 mg/dL   Chol/HDL Ratio 2.6 0.0 - 4.4 ratio    Comment:  T. Chol/HDL Ratio                                             Men  Women                               1/2 Avg.Risk  3.4    3.3                                   Avg.Risk  5.0    4.4                                2X Avg.Risk  9.6    7.1                                3X Avg.Risk 23.4   11.0   Vitamin D (25 hydroxy)     Status: None   Collection Time: 06/09/22  9:06 AM  Result Value Ref Range   Vit D, 25-Hydroxy 61.0 30.0 - 100.0 ng/mL    Comment: Vitamin D deficiency has been defined by the [place] practice guideline as a level of serum 25-OH vitamin D less than 20 ng/mL (1,2). The Endocrine Society went on to further define vitamin D insufficiency as a level between 21 and 29 ng/mL (2). 1. IOM (Institute of Medicine). 2010. Dietary reference    intakes for calcium and D. [place]: The    [employer]. 2. Holick MF, Binkley Glidden, Bischoff-Ferrari HA, et al.    Evaluation, treatment, and prevention of vitamin D    deficiency: an Endocrine Society clinical practice    guideline. JCEM. 2011 Jul; 96(7):1911-30.   TSH + free T4     Status: None   Collection Time: 06/09/22  9:06 AM  Result Value Ref Range   TSH 0.777 0.450 - 4.500 uIU/mL   Free T4 1.29 0.82 - 1.77 ng/dL    Radiology: No results found.  No results found.  No  results found.    Assessment and Plan: Patient Active Problem List   Diagnosis Date Noted   Depression, major, single episode, mild ([place]) 11/05/2020   Family history of colon cancer 10/23/2018   Hematuria 07/31/2018   Chronic constipation 07/01/2018   Urinary tract infection with hematuria 06/23/2018   Dysuria 06/23/2018   Encounter for general adult medical examination with abnormal findings 05/05/2018   Stress incontinence (female) (female) 05/05/2018   Primary insomnia 05/05/2018   Paroxysmal atrial fibrillation ([place]) 07/04/2017   GERD (gastroesophageal reflux disease) 05/01/2017   Sick sinus syndrome ([place]) 05/01/2017   Other fatigue 05/01/2017   Moderate mitral insufficiency 11/02/2016   Essential hypertension 03/01/2015   Cardiac pacemaker 03/01/2015   Hyperlipemia, mixed 03/06/2014   OSA on CPAP 03/06/2014   Diffuse cystic mastopathy 08/06/2012   Family history of malignant neoplasm of gastrointestinal tract 08/06/2012   1. Obstructive sleep apnea [G47.33] Continue nightly use  2. CPAP use counseling CPAP couseling-Discussed importance of adequate CPAP use as well as proper care and cleaning techniques of machine and all supplies.  3. Essential hypertension Continue  current medication and f/u with PCP.  4. Paroxysmal atrial fibrillation ([place]) Followed by cardiology  5. Obesity (BMI 30.0-34.9) Obesity Counseling: Had a lengthy discussion regarding patients BMI and weight issues. Patient was instructed on portion control as well as increased activity. Also discussed caloric restrictions with trying to maintain intake less than 2000 Kcal. Discussions were made in accordance with the 5As of weight management. Simple actions such as not eating late and if able to, taking a walk is suggested.    General Counseling: I have discussed the findings of the evaluation and examination with Cathy Curtis.  I have also discussed any further diagnostic evaluation thatmay be needed or  ordered today. Elize verbalizes understanding of the findings of todays visit. We also reviewed her medications today and discussed drug interactions and side effects including but not limited excessive drowsiness and altered mental states. We also discussed that there is always a risk not just to her but also people around her. she has been encouraged to call the office with any questions or concerns that should arise related to todays visit.  No orders of the defined types were placed in this encounter.    Time spent: 30  I have personally obtained a history, examined the patient, evaluated laboratory and imaging results, formulated the assessment and plan and placed orders. This patient was seen by Drema Dallas, PA-C in collaboration with Dr. Devona Konig as a part of collaborative care agreement.     Allyne Gee, MD [facility] Pulmonary and Critical Care Sleep medicine

## 2022-08-01 DIAGNOSIS — J449 Chronic obstructive pulmonary disease, unspecified: Secondary | ICD-10-CM | POA: Diagnosis not present

## 2022-08-29 DIAGNOSIS — G4733 Obstructive sleep apnea (adult) (pediatric): Secondary | ICD-10-CM | POA: Diagnosis not present

## 2022-08-29 DIAGNOSIS — R0902 Hypoxemia: Secondary | ICD-10-CM | POA: Diagnosis not present

## 2022-09-01 DIAGNOSIS — J449 Chronic obstructive pulmonary disease, unspecified: Secondary | ICD-10-CM | POA: Diagnosis not present

## 2022-10-01 DIAGNOSIS — J449 Chronic obstructive pulmonary disease, unspecified: Secondary | ICD-10-CM | POA: Diagnosis not present

## 2022-10-25 ENCOUNTER — Telehealth: Payer: Self-pay | Admitting: Nurse Practitioner

## 2022-10-25 NOTE — Telephone Encounter (Signed)
Lvm & sent mychart msg to either move 01/03/23 appt to morning or to another afternoon-Toni

## 2022-11-01 DIAGNOSIS — J449 Chronic obstructive pulmonary disease, unspecified: Secondary | ICD-10-CM | POA: Diagnosis not present

## 2022-11-27 DIAGNOSIS — R0902 Hypoxemia: Secondary | ICD-10-CM | POA: Diagnosis not present

## 2022-11-27 DIAGNOSIS — G4733 Obstructive sleep apnea (adult) (pediatric): Secondary | ICD-10-CM | POA: Diagnosis not present

## 2022-11-28 DIAGNOSIS — I495 Sick sinus syndrome: Secondary | ICD-10-CM | POA: Diagnosis not present

## 2022-11-29 ENCOUNTER — Encounter: Payer: Self-pay | Admitting: Nurse Practitioner

## 2022-11-29 ENCOUNTER — Ambulatory Visit (INDEPENDENT_AMBULATORY_CARE_PROVIDER_SITE_OTHER): Payer: Medicare PPO | Admitting: Nurse Practitioner

## 2022-11-29 VITALS — BP 125/60 | HR 83 | Temp 98.4°F | Resp 16 | Ht 66.0 in | Wt 204.0 lb

## 2022-11-29 DIAGNOSIS — I10 Essential (primary) hypertension: Secondary | ICD-10-CM

## 2022-11-29 DIAGNOSIS — E782 Mixed hyperlipidemia: Secondary | ICD-10-CM | POA: Diagnosis not present

## 2022-11-29 DIAGNOSIS — Z23 Encounter for immunization: Secondary | ICD-10-CM | POA: Diagnosis not present

## 2022-11-29 DIAGNOSIS — M1991 Primary osteoarthritis, unspecified site: Secondary | ICD-10-CM

## 2022-11-29 DIAGNOSIS — N393 Stress incontinence (female) (male): Secondary | ICD-10-CM | POA: Diagnosis not present

## 2022-11-29 MED ORDER — AMLODIPINE BESYLATE 5 MG PO TABS: 5.0000 mg | ORAL_TABLET | Freq: Every day | ORAL | 3 refills | Status: DC

## 2022-11-29 MED ORDER — ATORVASTATIN CALCIUM 10 MG PO TABS: 10.0000 mg | ORAL_TABLET | Freq: Every day | ORAL | 1 refills | Status: DC

## 2022-11-29 MED ORDER — CELECOXIB 200 MG PO CAPS: 200.0000 mg | ORAL_CAPSULE | Freq: Every day | ORAL | 3 refills | Status: DC

## 2022-11-29 MED ORDER — OXYBUTYNIN CHLORIDE ER 15 MG PO TB24: 15.0000 mg | ORAL_TABLET | Freq: Every day | ORAL | 1 refills | Status: DC

## 2022-11-29 MED ORDER — ZOSTER VAC RECOMB ADJUVANTED 50 MCG/0.5ML IM SUSR: 0.5000 mL | Freq: Once | INTRAMUSCULAR | 0 refills | Status: AC

## 2022-11-29 NOTE — Progress Notes (Signed)
[facility] [address] [place], [place] [address]  Internal MEDICINE  Office Visit Note  Patient Name: Cathy Curtis  [id number]  [id number]  Date of Service: 11/29/2022  Chief Complaint  Patient presents with   Hypertension   Hyperlipidemia   Follow-up    HPI Zahira presents for a follow-up visit for hypertension, GERD, high cholesterol, and insomnia.  Hypertension -- controlled with current medications  GERD -- taking omeprazole, controlled.  High cholesterol -- taking atorvastatin  Insomnia -- takes [place] as needed.      Current Medication: Outpatient Encounter Medications as of 11/29/2022  Medication Sig   acetaminophen (TYLENOL) 500 MG tablet Take 500 mg by mouth every 6 (six) hours as needed.   apixaban (ELIQUIS) 5 MG TABS tablet Take 1 tablet (5 mg total) by mouth 2 (two) times daily.   Calcium Citrate-Vitamin D 200-250 MG-UNIT TABS Take 600 mg by mouth.    Cholecalciferol (VITAMIN D-3) 1000 UNITS CAPS Take by mouth daily.   Coenzyme Q10 (CO Q-10) 100 MG CAPS Take 200 mg by mouth daily.    metoprolol tartrate (LOPRESSOR) 25 MG tablet Take 25 mg by mouth 2 (two) times daily.   omeprazole (PRILOSEC) 40 MG capsule TAKE (1) CAPSULE BY MOUTH ONCE DAILY.   telmisartan (MICARDIS) 80 MG tablet    zolpidem (AMBIEN) 10 MG tablet TAKE 1 TABLET BY MOUTH AT BEDTIME AS NEEDED FOR SLEEP   Zoster Vaccine Adjuvanted [facility]) injection Inject 0.5 mLs into the muscle once for 1 dose.   [DISCONTINUED] amLODipine (NORVASC) 5 MG tablet Take 5 mg by mouth daily.   [DISCONTINUED] atorvastatin (LIPITOR) 10 MG tablet Take 1 tablet (10 mg total) by mouth daily.   [DISCONTINUED] celecoxib (CELEBREX) 200 MG capsule Take 1 capsule (200 mg total) by mouth daily.   [DISCONTINUED] oxybutynin (DITROPAN XL) 15 MG 24 hr tablet Take 1 tablet (15 mg total) by mouth at bedtime.   amLODipine (NORVASC) 5 MG tablet Take 1 tablet (5 mg total) by mouth daily.   atorvastatin (LIPITOR) 10 MG  tablet Take 1 tablet (10 mg total) by mouth daily.   celecoxib (CELEBREX) 200 MG capsule Take 1 capsule (200 mg total) by mouth daily.   oxybutynin (DITROPAN XL) 15 MG 24 hr tablet Take 1 tablet (15 mg total) by mouth at bedtime.   No facility-administered encounter medications on file as of 11/29/2022.    Surgical History: Past Surgical History:  Procedure Laterality Date   ABDOMINAL HYSTERECTOMY     CARDIOVERSION  2009, 2010   CHOLECYSTECTOMY     COLONOSCOPY  2010,02/2014   Dr. Evette Cristal, [facility]   COLONOSCOPY WITH PROPOFOL N/A 11/14/2019   Procedure: COLONOSCOPY WITH PROPOFOL;  Surgeon: Earline Mayotte, MD;  Location[facility] ENDOSCOPY;  Service: Endoscopy;  Laterality: N/A;   HEMORRHOID SURGERY  09-06-2007   stapled   INSERT / REPLACE / REMOVE PACEMAKER     PACEMAKER INSERTION Left 09/2011   SALPINGOOPHORECTOMY  1974    Medical History: Past Medical History:  Diagnosis Date   Atrial fibrillation ([facility]) 2016   Atrial flutter ([facility]) 2009   Breast screening, unspecified    Diffuse cystic mastopathy    FCD   Family history of malignant neoplasm of gastrointestinal tract    Heart disease    Hemorrhoids 2009   resolved   Hiatal hernia    Hyperlipidemia    Obesity, unspecified    Osteoarthritis    Osteoporosis    Personal history of tobacco use, presenting hazards  to health    Screening for obesity    Sleep apnea    admits to c-pap   Special screening for malignant neoplasms, colon    Unspecified essential hypertension 2002    Family History: Family History  Problem Relation Age of Onset   Colon cancer Father    Colon cancer Sister    Heart disease Sister    Colon cancer Sister    Stroke Mother    Colon cancer Nephew     Social History   Socioeconomic History   Marital status: Married    Spouse name: Not on file   Number of children: Not on file   Years of education: Not on file   Highest education level: Not on file  Occupational History   Not on file  Tobacco  Use   Smoking status: Former    Current packs/day: 0.00    Average packs/day: 1 pack/day for 20.0 years (20.0 ttl pk-yrs)    Types: Cigarettes    Start date: 05/16/1971    Quit date: 05/16/1991    Years since quitting: 31.5   Smokeless tobacco: Never  Vaping Use   Vaping status: Never Used  Substance and Sexual Activity   Alcohol use: Yes    Comment: rarely   Drug use: No   Sexual activity: Not on file  Other Topics Concern   Not on file  Social History Narrative   Not on file   Social Determinants of Health   Financial Resource Strain: Not on file  Food Insecurity: Not on file  Transportation Needs: Not on file  Physical Activity: Not on file  Stress: Not on file  Social Connections: Not on file  Intimate Partner Violence: Not on file      Review of Systems  Constitutional:  Negative for chills, fatigue and unexpected weight change.  HENT:  Negative for congestion, rhinorrhea, sneezing and sore throat.   Eyes:  Negative for redness.  Respiratory:  Negative for cough, chest tightness and shortness of breath.   Cardiovascular:  Negative for chest pain and palpitations.  Gastrointestinal:  Negative for abdominal pain, constipation, diarrhea, nausea and vomiting.  Genitourinary:  Negative for dysuria and frequency.  Musculoskeletal:  Negative for arthralgias, back pain, joint swelling and neck pain.  Skin:  Negative for rash.  Neurological: Negative.  Negative for tremors and numbness.  Hematological:  Negative for adenopathy. Does not bruise/bleed easily.  Psychiatric/Behavioral:  Negative for behavioral problems (Depression), sleep disturbance and suicidal ideas. The patient is not nervous/anxious.     Vital Signs: BP 125/60 Comment: 140/88  Pulse 83   Temp 98.4 F (36.9 C)   Resp 16   Ht 5\' 6"  (1.676 m)   Wt 204 lb (92.5 kg)   SpO2 93%   BMI 32.93 kg/m    Physical Exam Vitals reviewed.  Constitutional:      General: She is not in acute distress.     Appearance: Normal appearance. She is well-developed. She is obese. She is not ill-appearing or diaphoretic.  HENT:     Head: Normocephalic and atraumatic.  Eyes:     Pupils: Pupils are equal, round, and reactive to light.  Neck:     Thyroid: No thyromegaly.     Vascular: No JVD.     Trachea: No tracheal deviation.  Cardiovascular:     Rate and Rhythm: Normal rate and regular rhythm.  Pulmonary:     Effort: Pulmonary effort is normal. No respiratory distress.  Neurological:  Mental Status: She is alert and oriented to person, place, and time.  Psychiatric:        Mood and Affect: Mood normal.        Behavior: Behavior normal.        Assessment/Plan: 1. Essential hypertension Continue amlodipine as prescribed - amLODipine (NORVASC) 5 MG tablet; Take 1 tablet (5 mg total) by mouth daily.  Dispense: 90 tablet; Refill: 3  2. Stress incontinence (female) (female) Continue oxybutynin as prescribed  - oxybutynin (DITROPAN XL) 15 MG 24 hr tablet; Take 1 tablet (15 mg total) by mouth at bedtime.  Dispense: 90 tablet; Refill: 1  3. Mixed hyperlipidemia Continue atorvastatin as prescribed  - atorvastatin (LIPITOR) 10 MG tablet; Take 1 tablet (10 mg total) by mouth daily.  Dispense: 90 tablet; Refill: 1  4. Primary osteoarthritis, unspecified site Continue celebrex as prescribed  - celecoxib (CELEBREX) 200 MG capsule; Take 1 capsule (200 mg total) by mouth daily.  Dispense: 90 capsule; Refill: 3  5. Need for vaccination - Zoster Vaccine Adjuvanted [facility]) injection; Inject 0.5 mLs into the muscle once for 1 dose.  Dispense: 0.5 mL; Refill: 0   General Counseling: Pedro verbalizes understanding of the findings of todays visit and agrees with plan of treatment. I have discussed any further diagnostic evaluation that may be needed or ordered today. We also reviewed her medications today. she has been encouraged to call the office with any questions or concerns that should arise  related to todays visit.    No orders of the defined types were placed in this encounter.   Meds ordered this encounter  Medications   amLODipine (NORVASC) 5 MG tablet    Sig: Take 1 tablet (5 mg total) by mouth daily.    Dispense:  90 tablet    Refill:  3    For future refills   atorvastatin (LIPITOR) 10 MG tablet    Sig: Take 1 tablet (10 mg total) by mouth daily.    Dispense:  90 tablet    Refill:  1   celecoxib (CELEBREX) 200 MG capsule    Sig: Take 1 capsule (200 mg total) by mouth daily.    Dispense:  90 capsule    Refill:  3   oxybutynin (DITROPAN XL) 15 MG 24 hr tablet    Sig: Take 1 tablet (15 mg total) by mouth at bedtime.    Dispense:  90 tablet    Refill:  1   Zoster Vaccine Adjuvanted [facility]) injection    Sig: Inject 0.5 mLs into the muscle once for 1 dose.    Dispense:  0.5 mL    Refill:  0    Return for previously scheduled, CPE, Marabeth Melland PCP in 6 months .   Total time spent:30 Minutes Time spent includes review of chart, medications, test results, and follow up plan with the patient.   [place] Controlled Substance Database was reviewed by me.  This patient was seen by Sallyanne Kuster, FNP-C in collaboration with Dr. Beverely Risen as a part of collaborative care agreement.   Vina Byrd R. Tedd Sias, MSN, FNP-C Internal medicine

## 2022-12-01 DIAGNOSIS — J449 Chronic obstructive pulmonary disease, unspecified: Secondary | ICD-10-CM | POA: Diagnosis not present

## 2022-12-11 ENCOUNTER — Other Ambulatory Visit: Payer: Self-pay | Admitting: Internal Medicine

## 2022-12-11 DIAGNOSIS — F5101 Primary insomnia: Secondary | ICD-10-CM

## 2022-12-11 NOTE — Telephone Encounter (Signed)
Next appt 8/24

## 2023-01-03 ENCOUNTER — Ambulatory Visit (INDEPENDENT_AMBULATORY_CARE_PROVIDER_SITE_OTHER): Payer: Medicare PPO

## 2023-01-03 DIAGNOSIS — G4733 Obstructive sleep apnea (adult) (pediatric): Secondary | ICD-10-CM

## 2023-01-03 NOTE — Progress Notes (Signed)
95 percentile pressure 9.9   95th percentile leak 25.1   apnea index 0.3 /hr  apnea-hypopnea index  0.4 /hr   total days used  >4 hr 69 days  total days used <4 hr 2 days  Total compliance 79 percent She is doing great no problems or questions   Pt was seen by Tresa Endo  RRT/RCP  from [facility]

## 2023-01-18 ENCOUNTER — Ambulatory Visit: Payer: Medicare PPO | Admitting: Physician Assistant

## 2023-01-18 ENCOUNTER — Encounter: Payer: Self-pay | Admitting: Physician Assistant

## 2023-01-18 VITALS — BP 130/80 | HR 85 | Temp 97.8°F | Resp 16 | Ht 66.0 in | Wt 203.0 lb

## 2023-01-18 DIAGNOSIS — G4733 Obstructive sleep apnea (adult) (pediatric): Secondary | ICD-10-CM | POA: Diagnosis not present

## 2023-01-18 DIAGNOSIS — I480 Paroxysmal atrial fibrillation: Secondary | ICD-10-CM

## 2023-01-18 DIAGNOSIS — Z7189 Other specified counseling: Secondary | ICD-10-CM | POA: Diagnosis not present

## 2023-01-18 DIAGNOSIS — E669 Obesity, unspecified: Secondary | ICD-10-CM

## 2023-01-18 DIAGNOSIS — I10 Essential (primary) hypertension: Secondary | ICD-10-CM | POA: Diagnosis not present

## 2023-01-18 NOTE — Progress Notes (Signed)
[facility] [address] [place], [place] [address]  Pulmonary Sleep Medicine   Office Visit Note  Patient Name: Cathy Curtis DOB: November 13, 1940 MRN [id number]  Date of Service: 01/18/2023  Complaints/HPI: Pt is here for routine pulmonary follow up. Patient is using almost nightly. Does occasionally doze off in recliner and is working on this. Denies dryness, SOB, or headaches. Changes supplies regularly and keeps it clean. Uses AH and has no problems or concerns today.   Compliance Download:  95 percentile pressure 9.9  95th percentile leak 25.1  apnea index 0.3 /hr  apnea-hypopnea index  0.4 /hr  total days used  >4 hr 69 days  total days used <4 hr 2 days Total compliance 79 percent   ROS  General: (-) fever, (-) chills, (-) night sweats, (-) weakness Skin: (-) rashes, (-) itching,. Eyes: (-) visual changes, (-) redness, (-) itching. Nose and Sinuses: (-) nasal stuffiness or itchiness, (-) postnasal drip, (-) nosebleeds, (-) sinus trouble. Mouth and Throat: (-) sore throat, (-) hoarseness. Neck: (-) swollen glands, (-) enlarged thyroid, (-) neck pain. Respiratory: - cough, (-) bloody sputum, - shortness of breath, - wheezing. Cardiovascular: - ankle swelling, (-) chest pain. Lymphatic: (-) lymph node enlargement. Neurologic: (-) numbness, (-) tingling. Psychiatric: (-) anxiety, (-) depression   Current Medication: Outpatient Encounter Medications as of 01/18/2023  Medication Sig   acetaminophen (TYLENOL) 500 MG tablet Take 500 mg by mouth every 6 (six) hours as needed.   amLODipine (NORVASC) 5 MG tablet Take 1 tablet (5 mg total) by mouth daily.   apixaban (ELIQUIS) 5 MG TABS tablet Take 1 tablet (5 mg total) by mouth 2 (two) times daily.   atorvastatin (LIPITOR) 10 MG tablet Take 1 tablet (10 mg total) by mouth daily.   Calcium Citrate-Vitamin D 200-250 MG-UNIT TABS Take 600 mg by mouth.    celecoxib (CELEBREX) 200 MG capsule Take 1 capsule (200 mg total)  by mouth daily.   Cholecalciferol (VITAMIN D-3) 1000 UNITS CAPS Take by mouth daily.   Coenzyme Q10 (CO Q-10) 100 MG CAPS Take 200 mg by mouth daily.    metoprolol tartrate (LOPRESSOR) 25 MG tablet Take 25 mg by mouth 2 (two) times daily.   omeprazole (PRILOSEC) 40 MG capsule TAKE (1) CAPSULE BY MOUTH ONCE DAILY.   oxybutynin (DITROPAN XL) 15 MG 24 hr tablet Take 1 tablet (15 mg total) by mouth at bedtime.   telmisartan (MICARDIS) 80 MG tablet    zolpidem (AMBIEN) 10 MG tablet TAKE 1 TABLET BY MOUTH AT BEDTIME AS NEEDED FOR SLEEP   No facility-administered encounter medications on file as of 01/18/2023.    Surgical History: Past Surgical History:  Procedure Laterality Date   ABDOMINAL HYSTERECTOMY     CARDIOVERSION  2009, 2010   CHOLECYSTECTOMY     COLONOSCOPY  2010,02/2014   Dr. Evette Cristal, [facility]   COLONOSCOPY WITH PROPOFOL N/A 11/14/2019   Procedure: COLONOSCOPY WITH PROPOFOL;  Surgeon: Earline Mayotte, MD;  Location: [facility] ENDOSCOPY;  Service: Endoscopy;  Laterality: N/A;   HEMORRHOID SURGERY  09-06-2007   stapled   INSERT / REPLACE / REMOVE PACEMAKER     PACEMAKER INSERTION Left 09/2011   SALPINGOOPHORECTOMY  1974    Medical History: Past Medical History:  Diagnosis Date   Atrial fibrillation (HCC) 2016   Atrial flutter (HCC) 2009   Breast screening, unspecified    Diffuse cystic mastopathy    FCD   Family history of malignant neoplasm of gastrointestinal tract  Heart disease    Hemorrhoids 2009   resolved   Hiatal hernia    Hyperlipidemia    Obesity, unspecified    Osteoarthritis    Osteoporosis    Personal history of tobacco use, presenting hazards to health    Screening for obesity    Sleep apnea    admits to c-pap   Special screening for malignant neoplasms, colon    Unspecified essential hypertension 2002    Family History: Family History  Problem Relation Age of Onset   Colon cancer Father    Colon cancer Sister    Heart disease Sister    Colon cancer  Sister    Stroke Mother    Colon cancer Nephew     Social History: Social History   Socioeconomic History   Marital status: Married    Spouse name: Not on file   Number of children: Not on file   Years of education: Not on file   Highest education level: Not on file  Occupational History   Not on file  Tobacco Use   Smoking status: Former    Current packs/day: 0.00    Average packs/day: 1 pack/day for 20.0 years (20.0 ttl pk-yrs)    Types: Cigarettes    Start date: 05/16/1971    Quit date: 05/16/1991    Years since quitting: 31.6   Smokeless tobacco: Never  Vaping Use   Vaping status: Never Used  Substance and Sexual Activity   Alcohol use: Yes    Comment: rarely   Drug use: No   Sexual activity: Not on file  Other Topics Concern   Not on file  Social History Narrative   Not on file   Social Determinants of Health   Financial Resource Strain: Not on file  Food Insecurity: Not on file  Transportation Needs: Not on file  Physical Activity: Not on file  Stress: Not on file  Social Connections: Not on file  Intimate Partner Violence: Not on file    Vital Signs: Blood pressure 130/80, pulse 85, temperature 97.8 F (36.6 C), resp. rate 16, height 5\' 6"  (1.676 m), weight 203 lb (92.1 kg), SpO2 97%.  Examination: General Appearance: The patient is well-developed, well-nourished, and in no distress. Skin: Gross inspection of skin unremarkable. Head: normocephalic, no gross deformities. Eyes: no gross deformities noted. ENT: ears appear grossly normal no exudates. Neck: Supple. No thyromegaly. No LAD. Respiratory: Lungs clear to auscultation. Cardiovascular: Normal S1 and S2 without murmur or rub. Extremities: No cyanosis. pulses are equal. Neurologic: Alert and oriented. No involuntary movements.  LABS: No results found for this or any previous visit (from the past 2160 hour(s)).  Radiology: No results found.  No results found.  No results  found.    Assessment and Plan: Patient Active Problem List   Diagnosis Date Noted   Depression, major, single episode, mild (HCC) 11/05/2020   Family history of colon cancer 10/23/2018   Hematuria 07/31/2018   Chronic constipation 07/01/2018   Urinary tract infection with hematuria 06/23/2018   Dysuria 06/23/2018   Encounter for general adult medical examination with abnormal findings 05/05/2018   Stress incontinence (female) (female) 05/05/2018   Primary insomnia 05/05/2018   Paroxysmal atrial fibrillation (HCC) 07/04/2017   GERD (gastroesophageal reflux disease) 05/01/2017   Sick sinus syndrome (HCC) 05/01/2017   Other fatigue 05/01/2017   Moderate mitral insufficiency 11/02/2016   Essential hypertension 03/01/2015   Cardiac pacemaker 03/01/2015   Hyperlipemia, mixed 03/06/2014   OSA on CPAP 03/06/2014  Diffuse cystic mastopathy 08/06/2012   Family history of malignant neoplasm of gastrointestinal tract 08/06/2012    1. Obstructive sleep apnea [G47.33] Continue nightly cpap use  2. CPAP use counseling CPAP couseling-Discussed importance of adequate CPAP use as well as proper care and cleaning techniques of machine and all supplies.  3. Essential hypertension Continue current medication and f/u with PCP.  4. Paroxysmal atrial fibrillation (HCC) Followed by cardiology  5. Obesity (BMI 30.0-34.9) Obesity Counseling: Had a lengthy discussion regarding patients BMI and weight issues. Patient was instructed on portion control as well as increased activity. Also discussed caloric restrictions with trying to maintain intake less than 2000 Kcal. Discussions were made in accordance with the 5As of weight management. Simple actions such as not eating late and if able to, taking a walk is suggested.    General Counseling: I have discussed the findings of the evaluation and examination with Leta Jungling.  I have also discussed any further diagnostic evaluation thatmay be needed or ordered  today. Allene verbalizes understanding of the findings of todays visit. We also reviewed her medications today and discussed drug interactions and side effects including but not limited excessive drowsiness and altered mental states. We also discussed that there is always a risk not just to her but also people around her. she has been encouraged to call the office with any questions or concerns that should arise related to todays visit.  No orders of the defined types were placed in this encounter.    Time spent: 30  I have personally obtained a history, examined the patient, evaluated laboratory and imaging results, formulated the assessment and plan and placed orders. This patient was seen by Lynn Ito, PA-C in collaboration with Dr. Freda Munro as a part of collaborative care agreement.     Yevonne Pax, MD [facility] Pulmonary and Critical Care Sleep medicine

## 2023-02-02 DIAGNOSIS — Z950 Presence of cardiac pacemaker: Secondary | ICD-10-CM | POA: Diagnosis not present

## 2023-02-02 DIAGNOSIS — I340 Nonrheumatic mitral (valve) insufficiency: Secondary | ICD-10-CM | POA: Diagnosis not present

## 2023-02-02 DIAGNOSIS — I480 Paroxysmal atrial fibrillation: Secondary | ICD-10-CM | POA: Diagnosis not present

## 2023-02-02 DIAGNOSIS — I10 Essential (primary) hypertension: Secondary | ICD-10-CM | POA: Diagnosis not present

## 2023-02-02 DIAGNOSIS — I495 Sick sinus syndrome: Secondary | ICD-10-CM | POA: Diagnosis not present

## 2023-02-02 DIAGNOSIS — E782 Mixed hyperlipidemia: Secondary | ICD-10-CM | POA: Diagnosis not present

## 2023-02-02 DIAGNOSIS — G4733 Obstructive sleep apnea (adult) (pediatric): Secondary | ICD-10-CM | POA: Diagnosis not present

## 2023-02-27 DIAGNOSIS — I495 Sick sinus syndrome: Secondary | ICD-10-CM | POA: Diagnosis not present

## 2023-02-28 DIAGNOSIS — G4733 Obstructive sleep apnea (adult) (pediatric): Secondary | ICD-10-CM | POA: Diagnosis not present

## 2023-02-28 DIAGNOSIS — R0902 Hypoxemia: Secondary | ICD-10-CM | POA: Diagnosis not present

## 2023-03-04 ENCOUNTER — Other Ambulatory Visit: Payer: Self-pay | Admitting: Nurse Practitioner

## 2023-03-04 DIAGNOSIS — F5101 Primary insomnia: Secondary | ICD-10-CM

## 2023-04-24 DIAGNOSIS — I495 Sick sinus syndrome: Secondary | ICD-10-CM | POA: Diagnosis not present

## 2023-04-24 DIAGNOSIS — Z950 Presence of cardiac pacemaker: Secondary | ICD-10-CM | POA: Diagnosis not present

## 2023-05-28 DIAGNOSIS — R0902 Hypoxemia: Secondary | ICD-10-CM | POA: Diagnosis not present

## 2023-05-28 DIAGNOSIS — G4733 Obstructive sleep apnea (adult) (pediatric): Secondary | ICD-10-CM | POA: Diagnosis not present

## 2023-05-31 ENCOUNTER — Other Ambulatory Visit: Payer: Self-pay | Admitting: Nurse Practitioner

## 2023-05-31 DIAGNOSIS — E782 Mixed hyperlipidemia: Secondary | ICD-10-CM | POA: Diagnosis not present

## 2023-05-31 DIAGNOSIS — L659 Nonscarring hair loss, unspecified: Secondary | ICD-10-CM | POA: Diagnosis not present

## 2023-05-31 DIAGNOSIS — Z0001 Encounter for general adult medical examination with abnormal findings: Secondary | ICD-10-CM | POA: Diagnosis not present

## 2023-05-31 DIAGNOSIS — R7301 Impaired fasting glucose: Secondary | ICD-10-CM | POA: Diagnosis not present

## 2023-05-31 DIAGNOSIS — E559 Vitamin D deficiency, unspecified: Secondary | ICD-10-CM | POA: Diagnosis not present

## 2023-06-01 LAB — CBC WITH DIFFERENTIAL/PLATELET
Basophils Absolute: 0.1 10*3/uL (ref 0.0–0.2)
Basos: 1 %
EOS (ABSOLUTE): 0.3 10*3/uL (ref 0.0–0.4)
Eos: 4 %
Hematocrit: 40.4 % (ref 34.0–46.6)
Hemoglobin: 13 g/dL (ref 11.1–15.9)
Immature Grans (Abs): 0 10*3/uL (ref 0.0–0.1)
Immature Granulocytes: 0 %
Lymphocytes Absolute: 1.6 10*3/uL (ref 0.7–3.1)
Lymphs: 29 %
MCH: 30 pg (ref 26.6–33.0)
MCHC: 32.2 g/dL (ref 31.5–35.7)
MCV: 93 fL (ref 79–97)
Monocytes Absolute: 0.5 10*3/uL (ref 0.1–0.9)
Monocytes: 8 %
Neutrophils Absolute: 3.3 10*3/uL (ref 1.4–7.0)
Neutrophils: 58 %
Platelets: 160 10*3/uL (ref 150–450)
RBC: 4.34 x10E6/uL (ref 3.77–5.28)
RDW: 13 % (ref 11.7–15.4)
WBC: 5.7 10*3/uL (ref 3.4–10.8)

## 2023-06-01 LAB — COMPREHENSIVE METABOLIC PANEL
ALT: 9 [IU]/L (ref 0–32)
AST: 15 [IU]/L (ref 0–40)
Albumin: 4.1 g/dL (ref 3.8–4.8)
Alkaline Phosphatase: 65 [IU]/L (ref 44–121)
BUN/Creatinine Ratio: 14 (ref 12–28)
BUN: 14 mg/dL (ref 8–27)
Bilirubin Total: 0.4 mg/dL (ref 0.0–1.2)
CO2: 24 mmol/L (ref 20–29)
Calcium: 9.6 mg/dL (ref 8.7–10.3)
Chloride: 105 mmol/L (ref 96–106)
Creatinine, Ser: 0.97 mg/dL (ref 0.57–1.00)
Globulin, Total: 2.9 g/dL (ref 1.5–4.5)
Glucose: 103 mg/dL — ABNORMAL HIGH (ref 70–99)
Potassium: 4.3 mmol/L (ref 3.5–5.2)
Sodium: 143 mmol/L (ref 134–144)
Total Protein: 7 g/dL (ref 6.0–8.5)
eGFR: 59 mL/min/{1.73_m2} — ABNORMAL LOW (ref >59)

## 2023-06-01 LAB — LIPID PANEL
Chol/HDL Ratio: 3 {ratio} (ref 0.0–4.4)
Cholesterol, Total: 155 mg/dL (ref 100–199)
HDL: 51 mg/dL (ref >39)
LDL Chol Calc (NIH): 85 mg/dL (ref 0–99)
Triglycerides: 107 mg/dL (ref 0–149)
VLDL Cholesterol Cal: 19 mg/dL (ref 5–40)

## 2023-06-01 LAB — TSH: TSH: 0.987 u[IU]/mL (ref 0.450–4.500)

## 2023-06-01 LAB — VITAMIN D 25 HYDROXY (VIT D DEFICIENCY, FRACTURES): Vit D, 25-Hydroxy: 59.9 ng/mL (ref 30.0–100.0)

## 2023-06-01 LAB — HGB A1C W/O EAG: Hgb A1c MFr Bld: 6.1 % — ABNORMAL HIGH (ref 4.8–5.6)

## 2023-06-01 LAB — T4, FREE: Free T4: 1.08 ng/dL (ref 0.82–1.77)

## 2023-06-11 DIAGNOSIS — H43811 Vitreous degeneration, right eye: Secondary | ICD-10-CM | POA: Diagnosis not present

## 2023-06-11 DIAGNOSIS — Z0100 Encounter for examination of eyes and vision without abnormal findings: Secondary | ICD-10-CM | POA: Diagnosis not present

## 2023-06-11 DIAGNOSIS — H2513 Age-related nuclear cataract, bilateral: Secondary | ICD-10-CM | POA: Diagnosis not present

## 2023-06-11 DIAGNOSIS — H353131 Nonexudative age-related macular degeneration, bilateral, early dry stage: Secondary | ICD-10-CM | POA: Diagnosis not present

## 2023-06-13 ENCOUNTER — Encounter: Payer: Self-pay | Admitting: Nurse Practitioner

## 2023-06-13 ENCOUNTER — Ambulatory Visit (INDEPENDENT_AMBULATORY_CARE_PROVIDER_SITE_OTHER): Payer: Medicare PPO | Admitting: Nurse Practitioner

## 2023-06-13 VITALS — BP 136/84 | HR 68 | Temp 97.5°F | Resp 16 | Ht 66.0 in | Wt 201.6 lb

## 2023-06-13 DIAGNOSIS — I10 Essential (primary) hypertension: Secondary | ICD-10-CM | POA: Diagnosis not present

## 2023-06-13 DIAGNOSIS — R7303 Prediabetes: Secondary | ICD-10-CM | POA: Diagnosis not present

## 2023-06-13 DIAGNOSIS — E66811 Obesity, class 1: Secondary | ICD-10-CM

## 2023-06-13 DIAGNOSIS — I480 Paroxysmal atrial fibrillation: Secondary | ICD-10-CM | POA: Diagnosis not present

## 2023-06-13 DIAGNOSIS — Z0000 Encounter for general adult medical examination without abnormal findings: Secondary | ICD-10-CM | POA: Diagnosis not present

## 2023-06-13 NOTE — Progress Notes (Signed)
[facility] [address] [place], [place] [address]  Internal MEDICINE  Office Visit Note  Patient Name: Cathy Curtis  [id number]  [id number]  Date of Service: 06/13/2023  Chief Complaint  Patient presents with   Hypertension   Hyperlipidemia   Medicare Wellness    HPI Reisa presents for an annual well visit and physical exam.  Well-appearing 83 y.o. female with hypertension, GERD, constipation, OSA, atrial fibrillation, stress incontinence, depression and fatigue.  Routine mammogram: only has needed.  Eye exam went to eye doctor on Monday this week foot exam: done  Labs: results discussed with patient today  Kidney function slightly improved.  New or worsening pain: none  Other concerns: Prediabetes noted on labs      06/13/2023    9:51 AM 06/07/2022   11:03 AM 05/11/2021   10:53 AM  MMSE - Mini Mental State Exam  Orientation to time 5 5 5   Orientation to Place 5 5 5   Registration 3 3 3   Attention/ Calculation 5 5 5   Recall 3 3 3   Language- name 2 objects 2 2 2   Language- repeat 1 1 1   Language- follow 3 step command 3 3 3   Language- read & follow direction 1 1 1   Write a sentence 1 1 1   Copy design 1 1 1   Total score 30 30 30     Functional Status Survey: Is the patient deaf or have difficulty hearing?: No Does the patient have difficulty seeing, even when wearing glasses/contacts?: No Does the patient have difficulty concentrating, remembering, or making decisions?: No Does the patient have difficulty walking or climbing stairs?: No Does the patient have difficulty dressing or bathing?: No Does the patient have difficulty doing errands alone such as visiting a doctor's office or shopping?: No     11/09/2021   11:55 AM 02/02/2022   12:20 PM 02/03/2022   12:49 PM 06/07/2022   10:59 AM 06/13/2023    9:50 AM  Fall Risk  Falls in the past year? 0   1 0  Was there an injury with Fall?    0 0  Fall Risk Category Calculator    1 0  (RETIRED) Patient  Fall Risk Level Low fall risk Low fall risk Low fall risk    Patient at Risk for Falls Due to No Fall Risks    No Fall Risks  Fall risk Follow up Falls evaluation completed   Falls evaluation completed Falls evaluation completed       06/13/2023    9:50 AM  Depression screen PHQ 2/9  Decreased Interest 0  Down, Depressed, Hopeless 0  PHQ - 2 Score 0        Current Medication: Outpatient Encounter Medications as of 06/13/2023  Medication Sig   acetaminophen (TYLENOL) 500 MG tablet Take 500 mg by mouth every 6 (six) hours as needed.   amLODipine (NORVASC) 5 MG tablet Take 1 tablet (5 mg total) by mouth daily.   apixaban (ELIQUIS) 5 MG TABS tablet Take 1 tablet (5 mg total) by mouth 2 (two) times daily.   atorvastatin (LIPITOR) 10 MG tablet Take 1 tablet (10 mg total) by mouth daily.   Calcium Citrate-Vitamin D 200-250 MG-UNIT TABS Take 600 mg by mouth.    celecoxib (CELEBREX) 200 MG capsule Take 1 capsule (200 mg total) by mouth daily.   Cholecalciferol (VITAMIN D-3) 1000 UNITS CAPS Take by mouth daily.   Coenzyme Q10 (CO Q-10) 100 MG CAPS Take 200 mg by mouth  daily.    metoprolol tartrate (LOPRESSOR) 25 MG tablet Take 25 mg by mouth 2 (two) times daily.   omeprazole (PRILOSEC) 40 MG capsule TAKE (1) CAPSULE BY MOUTH ONCE DAILY.   oxybutynin (DITROPAN XL) 15 MG 24 hr tablet Take 1 tablet (15 mg total) by mouth at bedtime.   telmisartan (MICARDIS) 80 MG tablet    zolpidem (AMBIEN) 10 MG tablet TAKE 1 TABLET BY MOUTH AT BEDTIME AS NEEDED FOR SLEEP   No facility-administered encounter medications on file as of 06/13/2023.    Surgical History: Past Surgical History:  Procedure Laterality Date   ABDOMINAL HYSTERECTOMY     CARDIOVERSION  2009, 2010   CHOLECYSTECTOMY     COLONOSCOPY  2010,02/2014   Dr. Evette Cristal, [facility]   COLONOSCOPY WITH PROPOFOL N/A 11/14/2019   Procedure: COLONOSCOPY WITH PROPOFOL;  Surgeon: Earline Mayotte, MD;  Location: [facility] ENDOSCOPY;  Service: Endoscopy;   Laterality: N/A;   HEMORRHOID SURGERY  09-06-2007   stapled   INSERT / REPLACE / REMOVE PACEMAKER     PACEMAKER INSERTION Left 09/2011   SALPINGOOPHORECTOMY  1974    Medical History: Past Medical History:  Diagnosis Date   Atrial fibrillation (HCC) 2016   Atrial flutter (HCC) 2009   Breast screening, unspecified    Diffuse cystic mastopathy    FCD   Family history of malignant neoplasm of gastrointestinal tract    Heart disease    Hemorrhoids 2009   resolved   Hiatal hernia    Hyperlipidemia    Obesity, unspecified    Osteoarthritis    Osteoporosis    Personal history of tobacco use, presenting hazards to health    Screening for obesity    Sleep apnea    admits to c-pap   Special screening for malignant neoplasms, colon    Unspecified essential hypertension 2002    Family History: Family History  Problem Relation Age of Onset   Colon cancer Father    Colon cancer Sister    Heart disease Sister    Colon cancer Sister    Stroke Mother    Colon cancer Nephew     Social History   Socioeconomic History   Marital status: Married    Spouse name: Not on file   Number of children: Not on file   Years of education: Not on file   Highest education level: Not on file  Occupational History   Not on file  Tobacco Use   Smoking status: Former    Current packs/day: 0.00    Average packs/day: 1 pack/day for 20.0 years (20.0 ttl pk-yrs)    Types: Cigarettes    Start date: 05/16/1971    Quit date: 05/16/1991    Years since quitting: 32.0   Smokeless tobacco: Never  Vaping Use   Vaping status: Never Used  Substance and Sexual Activity   Alcohol use: Yes    Comment: rarely   Drug use: No   Sexual activity: Not on file  Other Topics Concern   Not on file  Social History Narrative   Not on file   Social Drivers of Health   Financial Resource Strain: Not on file  Food Insecurity: Not on file  Transportation Needs: Not on file  Physical Activity: Not on file  Stress:  Not on file  Social Connections: Not on file  Intimate Partner Violence: Not on file      Review of Systems  Constitutional:  Negative for activity change, appetite change, chills, fatigue, fever and unexpected  weight change.  HENT:  Negative for congestion, ear pain, postnasal drip, rhinorrhea, sneezing, sore throat and trouble swallowing.   Eyes: Negative.  Negative for redness.  Respiratory: Negative.  Negative for cough, chest tightness, shortness of breath and wheezing.   Cardiovascular: Negative.  Negative for chest pain and palpitations.  Gastrointestinal: Negative.  Negative for abdominal pain, blood in stool, constipation, diarrhea, nausea and vomiting.  Endocrine: Negative.   Genitourinary: Negative.  Negative for difficulty urinating, dysuria, frequency, hematuria and urgency.  Musculoskeletal:  Positive for arthralgias. Negative for back pain, joint swelling, myalgias and neck pain.  Skin: Negative.  Negative for rash and wound.  Allergic/Immunologic: Negative.  Negative for immunocompromised state.  Neurological: Negative.  Negative for dizziness, tremors, seizures, numbness and headaches.  Hematological: Negative.  Negative for adenopathy. Does not bruise/bleed easily.  Psychiatric/Behavioral: Negative.  Negative for behavioral problems (Depression), self-injury, sleep disturbance and suicidal ideas. The patient is not nervous/anxious.     Vital Signs: BP 136/84   Pulse 68   Temp (!) 97.5 F (36.4 C)   Resp 16   Ht 5\' 6"  (1.676 m)   Wt 201 lb 9.6 oz (91.4 kg)   SpO2 95%   BMI 32.54 kg/m    Physical Exam Vitals reviewed.  Constitutional:      General: She is awake. She is not in acute distress.    Appearance: Normal appearance. She is well-developed and well-groomed. She is obese. She is not ill-appearing or diaphoretic.  HENT:     Head: Normocephalic and atraumatic.     Mouth/Throat:     Pharynx: Uvula midline.  Eyes:     General: Lids are normal. Vision  grossly intact. Gaze aligned appropriately.     Extraocular Movements: Extraocular movements intact.     Conjunctiva/sclera: Conjunctivae normal.     Pupils: Pupils are equal, round, and reactive to light.     Funduscopic exam:    Right eye: Red reflex present.        Left eye: Red reflex present. Neck:     Thyroid: No thyromegaly.     Vascular: No JVD.     Trachea: Trachea and phonation normal. No tracheal deviation.  Cardiovascular:     Rate and Rhythm: Normal rate and regular rhythm.     Pulses: Normal pulses.     Heart sounds: Normal heart sounds, S1 normal and S2 normal. No murmur heard.    No friction rub. No gallop.  Pulmonary:     Effort: Pulmonary effort is normal. No accessory muscle usage or respiratory distress.     Breath sounds: Normal breath sounds and air entry. No stridor. No wheezing or rales.  Chest:     Chest wall: No tenderness.  Abdominal:     Palpations: There is no shifting dullness, fluid wave or pulsatile mass.  Skin:    General: Skin is warm and dry.     Capillary Refill: Capillary refill takes less than 2 seconds.  Neurological:     Mental Status: She is alert and oriented to person, place, and time.     Motor: No abnormal muscle tone.     Deep Tendon Reflexes: Reflexes are normal and symmetric.  Psychiatric:        Mood and Affect: Mood and affect normal.        Behavior: Behavior normal. Behavior is cooperative.        Assessment/Plan: 1. Encounter for subsequent annual wellness visit (AWV) in Medicare patient (Primary) Age-appropriate preventive screenings and  vaccinations discussed. Routine labs for health maintenance will be ordered. PHM updated.    2. Prediabetes Information about prediabetes and diet provided to the patient  3. Essential hypertension Stable, continue medications as prescribed.   4. Paroxysmal atrial fibrillation (HCC) Continue metoprolol and eliquis as prescribed. Also sees cardiology  5. Obesity (BMI  30.0-34.9) Lost 2 lbs, work on diet as discussed.       General Counseling: ardelia wrede understanding of the findings of todays visit and agrees with plan of treatment. I have discussed any further diagnostic evaluation that may be needed or ordered today. We also reviewed her medications today. she has been encouraged to call the office with any questions or concerns that should arise related to todays visit.    No orders of the defined types were placed in this encounter.   No orders of the defined types were placed in this encounter.   Return in about 6 months (around 12/11/2023) for F/U, Alliyah Roesler PCP.   Total time spent:30 Minutes Time spent includes review of chart, medications, test results, and follow up plan with the patient.   [place] Controlled Substance Database was reviewed by me.  This patient was seen by Sallyanne Kuster, FNP-C in collaboration with Dr. Beverely Risen as a part of collaborative care agreement.  Aristotelis Vilardi R. Tedd Sias, MSN, FNP-C Internal medicine

## 2023-07-01 ENCOUNTER — Other Ambulatory Visit: Payer: Self-pay | Admitting: Nurse Practitioner

## 2023-07-01 DIAGNOSIS — F5101 Primary insomnia: Secondary | ICD-10-CM

## 2023-07-02 NOTE — Telephone Encounter (Signed)
Last 1/25 and next 7/25

## 2023-07-04 ENCOUNTER — Ambulatory Visit: Payer: Medicare PPO

## 2023-07-09 ENCOUNTER — Other Ambulatory Visit: Payer: Self-pay | Admitting: Nurse Practitioner

## 2023-07-09 DIAGNOSIS — N393 Stress incontinence (female) (male): Secondary | ICD-10-CM

## 2023-07-19 ENCOUNTER — Encounter: Payer: Self-pay | Admitting: Physician Assistant

## 2023-07-19 ENCOUNTER — Ambulatory Visit (INDEPENDENT_AMBULATORY_CARE_PROVIDER_SITE_OTHER): Payer: Medicare PPO | Admitting: Physician Assistant

## 2023-07-19 VITALS — BP 120/78 | HR 80 | Temp 97.8°F | Resp 16 | Ht 66.0 in | Wt 201.8 lb

## 2023-07-19 DIAGNOSIS — E66811 Obesity, class 1: Secondary | ICD-10-CM

## 2023-07-19 DIAGNOSIS — Z7189 Other specified counseling: Secondary | ICD-10-CM

## 2023-07-19 DIAGNOSIS — I10 Essential (primary) hypertension: Secondary | ICD-10-CM | POA: Diagnosis not present

## 2023-07-19 DIAGNOSIS — G4733 Obstructive sleep apnea (adult) (pediatric): Secondary | ICD-10-CM | POA: Diagnosis not present

## 2023-07-19 DIAGNOSIS — I480 Paroxysmal atrial fibrillation: Secondary | ICD-10-CM

## 2023-07-19 NOTE — Progress Notes (Signed)
 [facility] [address] [place], [place] [address]  Pulmonary Sleep Medicine   Office Visit Note  Patient Name: Cathy Curtis DOB: 1940-06-13 MRN [id number]  Date of Service: 07/19/2023  Complaints/HPI: Pt is here for routine pulmonary follow up. Wearing cpap most nights. Sometimes falls asleep reading and doesn't put mask on. She is using and benefiting from PAP. Denies headaches, dryness, or SOB. Uses [facility] for supplies and changes them regularly. Keeping equipment clean.  CPAP compliance 06/18/23-07/17/23 Usage days 25/30 >4 hours 80% Avg use 5.5 hours Settings APAP 5-10cm H2O AHI 1.0   ROS  General: (-) fever, (-) chills, (-) night sweats, (-) weakness Skin: (-) rashes, (-) itching,. Eyes: (-) visual changes, (-) redness, (-) itching. Nose and Sinuses: (-) nasal stuffiness or itchiness, (-) postnasal drip, (-) nosebleeds, (-) sinus trouble. Mouth and Throat: (-) sore throat, (-) hoarseness. Neck: (-) swollen glands, (-) enlarged thyroid, (-) neck pain. Respiratory: - cough, (-) bloody sputum, - shortness of breath, - wheezing. Cardiovascular: - ankle swelling, (-) chest pain. Lymphatic: (-) lymph node enlargement. Neurologic: (-) numbness, (-) tingling. Psychiatric: (-) anxiety, (-) depression   Current Medication: Outpatient Encounter Medications as of 07/19/2023  Medication Sig   acetaminophen (TYLENOL) 500 MG tablet Take 500 mg by mouth every 6 (six) hours as needed.   amLODipine (NORVASC) 5 MG tablet Take 1 tablet (5 mg total) by mouth daily.   apixaban (ELIQUIS) 5 MG TABS tablet Take 1 tablet (5 mg total) by mouth 2 (two) times daily.   atorvastatin (LIPITOR) 10 MG tablet Take 1 tablet (10 mg total) by mouth daily.   Calcium Citrate-Vitamin D 200-250 MG-UNIT TABS Take 600 mg by mouth.    celecoxib (CELEBREX) 200 MG capsule Take 1 capsule (200 mg total) by mouth daily.   Cholecalciferol (VITAMIN D-3) 1000 UNITS CAPS Take by mouth daily.   Coenzyme Q10 (CO  Q-10) 100 MG CAPS Take 200 mg by mouth daily.    metoprolol tartrate (LOPRESSOR) 25 MG tablet Take 25 mg by mouth 2 (two) times daily.   omeprazole (PRILOSEC) 40 MG capsule TAKE (1) CAPSULE BY MOUTH ONCE DAILY.   oxybutynin (DITROPAN XL) 15 MG 24 hr tablet TAKE ONE TABLET BY MOUTH NIGHTLY AT BEDTIME   telmisartan (MICARDIS) 80 MG tablet    zolpidem (AMBIEN) 10 MG tablet TAKE 1 TABLET BY MOUTH AT BEDTIME AS NEEDED FOR SLEEP   No facility-administered encounter medications on file as of 07/19/2023.    Surgical History: Past Surgical History:  Procedure Laterality Date   ABDOMINAL HYSTERECTOMY     CARDIOVERSION  2009, 2010   CHOLECYSTECTOMY     COLONOSCOPY  2010,02/2014   Dr. Evette Cristal, [facility]   COLONOSCOPY WITH PROPOFOL N/A 11/14/2019   Procedure: COLONOSCOPY WITH PROPOFOL;  Surgeon: Earline Mayotte, MD;  Location: [facility] ENDOSCOPY;  Service: Endoscopy;  Laterality: N/A;   HEMORRHOID SURGERY  09-06-2007   stapled   INSERT / REPLACE / REMOVE PACEMAKER     PACEMAKER INSERTION Left 09/2011   SALPINGOOPHORECTOMY  1974    Medical History: Past Medical History:  Diagnosis Date   Atrial fibrillation (HCC) 2016   Atrial flutter (HCC) 2009   Breast screening, unspecified    Diffuse cystic mastopathy    FCD   Family history of malignant neoplasm of gastrointestinal tract    Heart disease    Hemorrhoids 2009   resolved   Hiatal hernia    Hyperlipidemia    Obesity, unspecified    Osteoarthritis  Osteoporosis    Personal history of tobacco use, presenting hazards to health    Screening for obesity    Sleep apnea    admits to c-pap   Special screening for malignant neoplasms, colon    Unspecified essential hypertension 2002    Family History: Family History  Problem Relation Age of Onset   Colon cancer Father    Colon cancer Sister    Heart disease Sister    Colon cancer Sister    Stroke Mother    Colon cancer Nephew     Social History: Social History   Socioeconomic History    Marital status: Married    Spouse name: Not on file   Number of children: Not on file   Years of education: Not on file   Highest education level: Not on file  Occupational History   Not on file  Tobacco Use   Smoking status: Former    Current packs/day: 0.00    Average packs/day: 1 pack/day for 20.0 years (20.0 ttl pk-yrs)    Types: Cigarettes    Start date: 05/16/1971    Quit date: 05/16/1991    Years since quitting: 32.1   Smokeless tobacco: Never  Vaping Use   Vaping status: Never Used  Substance and Sexual Activity   Alcohol use: Yes    Comment: rarely   Drug use: No   Sexual activity: Not on file  Other Topics Concern   Not on file  Social History Narrative   Not on file   Social Drivers of Health   Financial Resource Strain: Not on file  Food Insecurity: Not on file  Transportation Needs: Not on file  Physical Activity: Not on file  Stress: Not on file  Social Connections: Not on file  Intimate Partner Violence: Not on file    Vital Signs: Blood pressure 120/78, pulse 80, temperature 97.8 F (36.6 C), resp. rate 16, height 5\' 6"  (1.676 m), weight 201 lb 12.8 oz (91.5 kg), SpO2 91%.  Examination: General Appearance: The patient is well-developed, well-nourished, and in no distress. Skin: Gross inspection of skin unremarkable. Head: normocephalic, no gross deformities. Eyes: no gross deformities noted. ENT: ears appear grossly normal no exudates. Neck: Supple. No thyromegaly. No LAD. Respiratory: Lungs clear to auscultation. Cardiovascular: Normal S1 and S2 without murmur or rub. Extremities: No cyanosis. pulses are equal. Neurologic: Alert and oriented. No involuntary movements.  LABS: Recent Results (from the past 2160 hours)  CBC with Differential/Platelet     Status: None   Collection Time: 05/31/23 10:21 AM  Result Value Ref Range   WBC 5.7 3.4 - 10.8 x10E3/uL   RBC 4.34 3.77 - 5.28 x10E6/uL   Hemoglobin 13.0 11.1 - 15.9 g/dL   Hematocrit 62.9  52.8 - 46.6 %   MCV 93 79 - 97 fL   MCH 30.0 26.6 - 33.0 pg   MCHC 32.2 31.5 - 35.7 g/dL   RDW 41.3 24.4 - 01.0 %   Platelets 160 150 - 450 x10E3/uL   Neutrophils 58 Not Estab. %   Lymphs 29 Not Estab. %   Monocytes 8 Not Estab. %   Eos 4 Not Estab. %   Basos 1 Not Estab. %   Neutrophils Absolute 3.3 1.4 - 7.0 x10E3/uL   Lymphocytes Absolute 1.6 0.7 - 3.1 x10E3/uL   Monocytes Absolute 0.5 0.1 - 0.9 x10E3/uL   EOS (ABSOLUTE) 0.3 0.0 - 0.4 x10E3/uL   Basophils Absolute 0.1 0.0 - 0.2 x10E3/uL   Immature Granulocytes 0  Not Estab. %   Immature Grans (Abs) 0.0 0.0 - 0.1 x10E3/uL  Comprehensive metabolic panel     Status: Abnormal   Collection Time: 05/31/23 10:21 AM  Result Value Ref Range   Glucose 103 (H) 70 - 99 mg/dL   BUN 14 8 - 27 mg/dL   Creatinine, Ser 1.61 0.57 - 1.00 mg/dL   eGFR 59 (L) >09 UE/AVW/0.98   BUN/Creatinine Ratio 14 12 - 28   Sodium 143 134 - 144 mmol/L   Potassium 4.3 3.5 - 5.2 mmol/L   Chloride 105 96 - 106 mmol/L   CO2 24 20 - 29 mmol/L   Calcium 9.6 8.7 - 10.3 mg/dL   Total Protein 7.0 6.0 - 8.5 g/dL   Albumin 4.1 3.8 - 4.8 g/dL   Globulin, Total 2.9 1.5 - 4.5 g/dL   Bilirubin Total 0.4 0.0 - 1.2 mg/dL   Alkaline Phosphatase 65 44 - 121 IU/L   AST 15 0 - 40 IU/L   ALT 9 0 - 32 IU/L  Lipid panel     Status: None   Collection Time: 05/31/23 10:21 AM  Result Value Ref Range   Cholesterol, Total 155 100 - 199 mg/dL   Triglycerides 119 0 - 149 mg/dL   HDL 51 >14 mg/dL   VLDL Cholesterol Cal 19 5 - 40 mg/dL   LDL Chol Calc (NIH) 85 0 - 99 mg/dL   Chol/HDL Ratio 3.0 0.0 - 4.4 ratio    Comment:                                   T. Chol/HDL Ratio                                             Men  Women                               1/2 Avg.Risk  3.4    3.3                                   Avg.Risk  5.0    4.4                                2X Avg.Risk  9.6    7.1                                3X Avg.Risk 23.4   11.0   Hgb A1c w/o eAG     Status:  Abnormal   Collection Time: 05/31/23 10:21 AM  Result Value Ref Range   Hgb A1c MFr Bld 6.1 (H) 4.8 - 5.6 %    Comment:          Prediabetes: 5.7 - 6.4          Diabetes: >6.4          Glycemic control for adults with diabetes: <7.0   T4, free     Status: None   Collection Time: 05/31/23 10:21 AM  Result Value Ref Range   Free T4  1.08 0.82 - 1.77 ng/dL  TSH     Status: None   Collection Time: 05/31/23 10:21 AM  Result Value Ref Range   TSH 0.987 0.450 - 4.500 uIU/mL  VITAMIN D 25 Hydroxy (Vit-D Deficiency, Fractures)     Status: None   Collection Time: 05/31/23 10:21 AM  Result Value Ref Range   Vit D, 25-Hydroxy 59.9 30.0 - 100.0 ng/mL    Comment: Vitamin D deficiency has been defined by the Institute of Medicine and an Endocrine Society practice guideline as a level of serum 25-OH vitamin D less than 20 ng/mL (1,2). The Endocrine Society went on to further define vitamin D insufficiency as a level between 21 and 29 ng/mL (2). 1. IOM (Institute of Medicine). 2010. Dietary reference    intakes for calcium and D. [place]: The    [employer]. 2. Holick MF, Binkley [place], Bischoff-Ferrari HA, et al.    Evaluation, treatment, and prevention of vitamin D    deficiency: an Endocrine Society clinical practice    guideline. JCEM. 2011 Jul; 96(7):1911-30.     Radiology: No results found.  No results found.  No results found.    Assessment and Plan: Patient Active Problem List   Diagnosis Date Noted   Depression, major, single episode, mild (HCC) 11/05/2020   Family history of colon cancer 10/23/2018   Hematuria 07/31/2018   Chronic constipation 07/01/2018   Urinary tract infection with hematuria 06/23/2018   Dysuria 06/23/2018   Encounter for general adult medical examination with abnormal findings 05/05/2018   Stress incontinence (female) (female) 05/05/2018   Primary insomnia 05/05/2018   Paroxysmal atrial fibrillation (HCC) 07/04/2017   GERD  (gastroesophageal reflux disease) 05/01/2017   Sick sinus syndrome (HCC) 05/01/2017   Other fatigue 05/01/2017   Moderate mitral insufficiency 11/02/2016   Essential hypertension 03/01/2015   Cardiac pacemaker 03/01/2015   Hyperlipemia, mixed 03/06/2014   OSA on CPAP 03/06/2014   Diffuse cystic mastopathy 08/06/2012   Family history of malignant neoplasm of gastrointestinal tract 08/06/2012    1. Obstructive sleep apnea [G47.33] (Primary) Continue nightly use  2. CPAP use counseling CPAP couseling-Discussed importance of adequate CPAP use as well as proper care and cleaning techniques of machine and all supplies.  3. Paroxysmal atrial fibrillation (HCC) Followed by cardiology  4. Essential hypertension Continue current medication and f/u with PCP.  5. Obesity (BMI 30.0-34.9) Obesity Counseling: Had a lengthy discussion regarding patients BMI and weight issues. Patient was instructed on portion control as well as increased activity. Also discussed caloric restrictions with trying to maintain intake less than 2000 Kcal. Discussions were made in accordance with the 5As of weight management. Simple actions such as not eating late and if able to, taking a walk is suggested.    General Counseling: I have discussed the findings of the evaluation and examination with Leta Jungling.  I have also discussed any further diagnostic evaluation thatmay be needed or ordered today. Aisa verbalizes understanding of the findings of todays visit. We also reviewed her medications today and discussed drug interactions and side effects including but not limited excessive drowsiness and altered mental states. We also discussed that there is always a risk not just to her but also people around her. she has been encouraged to call the office with any questions or concerns that should arise related to todays visit.  No orders of the defined types were placed in this encounter.    Time spent: 30  I have  personally obtained a  history, examined the patient, evaluated laboratory and imaging results, formulated the assessment and plan and placed orders. This patient was seen by Lynn Ito, PA-C in collaboration with Dr. Freda Munro as a part of collaborative care agreement.     Yevonne Pax, MD [facility] Pulmonary and Critical Care Sleep medicine

## 2023-07-31 DIAGNOSIS — I495 Sick sinus syndrome: Secondary | ICD-10-CM | POA: Diagnosis not present

## 2023-08-13 ENCOUNTER — Other Ambulatory Visit: Payer: Self-pay | Admitting: Nurse Practitioner

## 2023-08-13 DIAGNOSIS — E782 Mixed hyperlipidemia: Secondary | ICD-10-CM

## 2023-08-20 ENCOUNTER — Other Ambulatory Visit: Payer: Self-pay | Admitting: Nurse Practitioner

## 2023-08-20 DIAGNOSIS — Z0001 Encounter for general adult medical examination with abnormal findings: Secondary | ICD-10-CM

## 2023-08-22 DIAGNOSIS — R0902 Hypoxemia: Secondary | ICD-10-CM | POA: Diagnosis not present

## 2023-08-22 DIAGNOSIS — G4733 Obstructive sleep apnea (adult) (pediatric): Secondary | ICD-10-CM | POA: Diagnosis not present

## 2023-10-30 DIAGNOSIS — I495 Sick sinus syndrome: Secondary | ICD-10-CM | POA: Diagnosis not present

## 2023-11-01 DIAGNOSIS — Z950 Presence of cardiac pacemaker: Secondary | ICD-10-CM | POA: Diagnosis not present

## 2023-11-01 DIAGNOSIS — I480 Paroxysmal atrial fibrillation: Secondary | ICD-10-CM | POA: Diagnosis not present

## 2023-11-01 DIAGNOSIS — I340 Nonrheumatic mitral (valve) insufficiency: Secondary | ICD-10-CM | POA: Diagnosis not present

## 2023-11-01 DIAGNOSIS — I10 Essential (primary) hypertension: Secondary | ICD-10-CM | POA: Diagnosis not present

## 2023-11-01 DIAGNOSIS — I495 Sick sinus syndrome: Secondary | ICD-10-CM | POA: Diagnosis not present

## 2023-11-01 DIAGNOSIS — R42 Dizziness and giddiness: Secondary | ICD-10-CM | POA: Diagnosis not present

## 2023-11-20 ENCOUNTER — Encounter: Payer: Self-pay | Admitting: Nurse Practitioner

## 2023-11-20 ENCOUNTER — Ambulatory Visit
Admission: RE | Admit: 2023-11-20 | Discharge: 2023-11-20 | Disposition: A | Discharge status: Home / Self Care | Source: Ambulatory Visit | Attending: Nurse Practitioner | Admitting: Nurse Practitioner

## 2023-11-20 ENCOUNTER — Ambulatory Visit: Payer: Self-pay

## 2023-11-20 ENCOUNTER — Ambulatory Visit: Admitting: Nurse Practitioner

## 2023-11-20 VITALS — BP 150/74 | HR 73 | Temp 98.2°F | Resp 16 | Ht 66.0 in | Wt 197.0 lb

## 2023-11-20 DIAGNOSIS — R519 Headache, unspecified: Secondary | ICD-10-CM | POA: Insufficient documentation

## 2023-11-20 DIAGNOSIS — I10 Essential (primary) hypertension: Secondary | ICD-10-CM

## 2023-11-20 DIAGNOSIS — R42 Dizziness and giddiness: Secondary | ICD-10-CM

## 2023-11-20 DIAGNOSIS — I6523 Occlusion and stenosis of bilateral carotid arteries: Secondary | ICD-10-CM | POA: Diagnosis not present

## 2023-11-20 MED ORDER — AMLODIPINE BESYLATE 5 MG PO TABS: 5.0000 mg | ORAL_TABLET | Freq: Every day | ORAL | 3 refills | Status: AC

## 2023-11-20 NOTE — Telephone Encounter (Signed)
 Left message for patient to give office a call back.

## 2023-11-20 NOTE — Progress Notes (Signed)
 [facility] [address] [place], [place] [address]  Internal MEDICINE  Office Visit Note  Patient Name: Cathy Curtis  [id number]  [id number]  Date of Service: 11/20/2023  Chief Complaint  Patient presents with   Acute Visit    Headaches since before June 19 off nd on and now the past couple of days its constant.      HPI Cathy Curtis presents for an acute sick visit for acute intractable headache --headaches started off and on in mid June, but now has become constant. Over the past few days, described at throbbing, like a cap on top of the head, reports having dizziness, slight nausea. Denies any sensitivity to light or sound.  Has tried tylenol extra strength She does not have a history of headaches or migraines. She denies any symptoms of a stroke, no change in speech, no change in vision, no weakness or paralysis of any extremities.         Current Medication:  Outpatient Encounter Medications as of 11/20/2023  Medication Sig   meclizine (ANTIVERT) 25 MG tablet Take by mouth.   acetaminophen (TYLENOL) 500 MG tablet Take 500 mg by mouth every 6 (six) hours as needed.   amLODipine  (NORVASC ) 5 MG tablet Take 1 tablet (5 mg total) by mouth daily.   apixaban  (ELIQUIS ) 5 MG TABS tablet Take 1 tablet (5 mg total) by mouth 2 (two) times daily.   atorvastatin  (LIPITOR) 10 MG tablet TAKE ONE TABLET BY MOUTH ONCE DAILY   Calcium  Citrate-Vitamin D  200-250 MG-UNIT TABS Take 600 mg by mouth.    celecoxib  (CELEBREX ) 200 MG capsule Take 1 capsule (200 mg total) by mouth daily.   Cholecalciferol (VITAMIN D -3) 1000 UNITS CAPS Take by mouth daily.   Coenzyme Q10 (CO Q-10) 100 MG CAPS Take 200 mg by mouth daily.    metoprolol tartrate (LOPRESSOR) 25 MG tablet Take 25 mg by mouth 2 (two) times daily.   omeprazole  (PRILOSEC) 40 MG capsule TAKE ONE CAPSULE BY MOUTH EVERY DAY   oxybutynin  (DITROPAN  XL) 15 MG 24 hr tablet TAKE ONE TABLET BY MOUTH NIGHTLY AT BEDTIME   telmisartan (MICARDIS)  80 MG tablet    zolpidem  (AMBIEN ) 10 MG tablet TAKE 1 TABLET BY MOUTH AT BEDTIME AS NEEDED FOR SLEEP   [DISCONTINUED] amLODipine  (NORVASC ) 5 MG tablet Take 1 tablet (5 mg total) by mouth daily.   No facility-administered encounter medications on file as of 11/20/2023.      Medical History: Past Medical History:  Diagnosis Date   Atrial fibrillation (HCC) 2016   Atrial flutter (HCC) 2009   Breast screening, unspecified    Diffuse cystic mastopathy    FCD   Family history of malignant neoplasm of gastrointestinal tract    Heart disease    Hemorrhoids 2009   resolved   Hiatal hernia    Hyperlipidemia    Obesity, unspecified    Osteoarthritis    Osteoporosis    Personal history of tobacco use, presenting hazards to health    Screening for obesity    Sleep apnea    admits to c-pap   Special screening for malignant neoplasms, colon    Unspecified essential hypertension 2002     Vital Signs: BP (!) 150/74   Pulse 73   Temp 98.2 F (36.8 C)   Resp 16   Ht 5' 6 (1.676 m)   Wt 197 lb (89.4 kg)   SpO2 96%   BMI 31.80 kg/m    Review of Systems  Constitutional:  Negative for chills, fatigue and unexpected weight change.  HENT:  Negative for congestion, rhinorrhea, sneezing and sore throat.   Eyes:  Negative for redness.  Respiratory:  Negative for cough, chest tightness and shortness of breath.   Cardiovascular:  Negative for chest pain and palpitations.  Gastrointestinal:  Positive for nausea. Negative for abdominal pain, constipation, diarrhea and vomiting.  Genitourinary:  Negative for dysuria and frequency.  Musculoskeletal:  Negative for arthralgias, back pain, joint swelling and neck pain.  Skin:  Negative for rash.  Neurological:  Positive for dizziness and headaches. Negative for tremors and numbness.  Hematological:  Negative for adenopathy. Does not bruise/bleed easily.  Psychiatric/Behavioral:  Negative for behavioral problems (Depression), sleep disturbance  and suicidal ideas. The patient is not nervous/anxious.     Physical Exam Vitals reviewed.  Constitutional:      General: She is not in acute distress.    Appearance: Normal appearance. She is obese. She is not ill-appearing.  HENT:     Head: Normocephalic and atraumatic.  Eyes:     Pupils: Pupils are equal, round, and reactive to light.  Cardiovascular:     Rate and Rhythm: Normal rate and regular rhythm.  Pulmonary:     Effort: Pulmonary effort is normal. No respiratory distress.  Neurological:     Mental Status: She is alert and oriented to person, place, and time.  Psychiatric:        Mood and Affect: Mood normal.        Behavior: Behavior normal.       Assessment/Plan: 1. Acute intractable headache, unspecified headache type (Primary) Sat head CT ordered, and patient will take dose of ubrlevy 100 mg after the ct scan is done.  - CT HEAD WO CONTRAST ( ); Future  2. Essential hypertension Refills ordered  - amLODipine  (NORVASC ) 5 MG tablet; Take 1 tablet (5 mg total) by mouth daily.  Dispense: 90 tablet; Refill: 3  3. Dizziness Mild, meclizine did not help.   General Counseling: Cathy Curtis understanding of the findings of todays visit and agrees with plan of treatment. I have discussed any further diagnostic evaluation that may be needed or ordered today. We also reviewed her medications today. she has been encouraged to call the office with any questions or concerns that should arise related to todays visit.    Counseling:    Orders Placed This Encounter  Procedures   CT HEAD WO CONTRAST ( )    Meds ordered this encounter  Medications   amLODipine  (NORVASC ) 5 MG tablet    Sig: Take 1 tablet (5 mg total) by mouth daily.    Dispense:  90 tablet    Refill:  3    For future refills    Return if symptoms worsen or fail to improve, for stat head CT ordered, patient will also call if the ubrelvy does not help.  Cathy Curtis Controlled Substance Database  was reviewed by me for overdose risk score (ORS)  Time spent:30 Minutes Time spent with patient included reviewing progress notes, labs, imaging studies, and discussing plan for follow up.   This patient was seen by Mardy Maxin, FNP-C in collaboration with Dr. Sigrid Bathe as a part of collaborative care agreement.  Cathy Petrich R. Maxin, MSN, FNP-C Internal Medicine

## 2023-11-20 NOTE — Telephone Encounter (Signed)
-----   Message from [place] sent at 11/20/2023  1:05 PM EDT ----- CT head is normal. No stroke or aneurysm seen. Please have patient let us  know if the dose of ubrelvy does not improve the migraine.  ----- Message ----- From: Interface, Rad Results In Sent: 11/20/2023  12:46 PM EDT To: Mardy Maxin, NP

## 2023-11-21 DIAGNOSIS — R0902 Hypoxemia: Secondary | ICD-10-CM | POA: Diagnosis not present

## 2023-11-21 DIAGNOSIS — G4733 Obstructive sleep apnea (adult) (pediatric): Secondary | ICD-10-CM | POA: Diagnosis not present

## 2023-12-12 ENCOUNTER — Encounter: Payer: Self-pay | Admitting: Nurse Practitioner

## 2023-12-12 ENCOUNTER — Ambulatory Visit: Payer: Medicare PPO | Admitting: Nurse Practitioner

## 2023-12-12 VITALS — BP 138/76 | HR 75 | Temp 97.3°F | Resp 16 | Ht 66.0 in | Wt 197.8 lb

## 2023-12-12 DIAGNOSIS — I480 Paroxysmal atrial fibrillation: Secondary | ICD-10-CM

## 2023-12-12 DIAGNOSIS — I10 Essential (primary) hypertension: Secondary | ICD-10-CM | POA: Diagnosis not present

## 2023-12-12 DIAGNOSIS — F5101 Primary insomnia: Secondary | ICD-10-CM | POA: Diagnosis not present

## 2023-12-12 DIAGNOSIS — R5383 Other fatigue: Secondary | ICD-10-CM | POA: Diagnosis not present

## 2023-12-12 DIAGNOSIS — E782 Mixed hyperlipidemia: Secondary | ICD-10-CM

## 2023-12-12 DIAGNOSIS — E559 Vitamin D deficiency, unspecified: Secondary | ICD-10-CM

## 2023-12-12 DIAGNOSIS — M1991 Primary osteoarthritis, unspecified site: Secondary | ICD-10-CM

## 2023-12-12 DIAGNOSIS — R7303 Prediabetes: Secondary | ICD-10-CM | POA: Diagnosis not present

## 2023-12-12 DIAGNOSIS — N393 Stress incontinence (female) (male): Secondary | ICD-10-CM

## 2023-12-12 MED ORDER — CELECOXIB 200 MG PO CAPS: 200.0000 mg | ORAL_CAPSULE | ORAL | 3 refills | Status: AC

## 2023-12-12 MED ORDER — ATORVASTATIN CALCIUM 10 MG PO TABS: 10.0000 mg | ORAL_TABLET | Freq: Every day | ORAL | 1 refills | Status: AC

## 2023-12-12 MED ORDER — ZOLPIDEM TARTRATE 10 MG PO TABS: 10.0000 mg | ORAL_TABLET | Freq: Every evening | ORAL | 1 refills | Status: DC | PRN

## 2023-12-12 MED ORDER — OXYBUTYNIN CHLORIDE ER 15 MG PO TB24: 15.0000 mg | ORAL_TABLET | Freq: Every day | ORAL | 1 refills | Status: AC

## 2023-12-12 NOTE — Progress Notes (Signed)
 [facility] [address] [place], [place] [address]  Internal MEDICINE  Office Visit Note  Patient Name: Cathy Curtis  [id number]  [id number]  Date of Service: 12/12/2023  Chief Complaint  Patient presents with   Hyperlipidemia   Hypertension   Follow-up    HPI Amonie presents for a follow-up visit for hypertension, high cholesterol, insomnia, afib and refills.  Hypertension -- controlled with amlodipine , metoprolol and telmisartan High cholesterol -- takes atorvastatin  daily  Insomnia -- takes ambien  as needed, does not take it every night  Prediabetes -- last level was 6.1, stable.  AFIB -- takes eliquis  chronically, followed by cardiology  Due for routine labs in January before her annual wellness visit.  Takes oxybutynin  for stress incontinence Headache is better. CT of the head was normal. Increased stress -- there pet dog is having testing done for possible lymphoma diagnosis.      Current Medication: Outpatient Encounter Medications as of 12/12/2023  Medication Sig   apixaban  (ELIQUIS ) 5 MG TABS tablet Take 1 tablet by mouth 2 (two) times daily.   acetaminophen (TYLENOL) 500 MG tablet Take 500 mg by mouth every 6 (six) hours as needed.   amLODipine  (NORVASC ) 5 MG tablet Take 1 tablet (5 mg total) by mouth daily.   atorvastatin  (LIPITOR) 10 MG tablet Take 1 tablet (10 mg total) by mouth daily.   Calcium  Citrate-Vitamin D  200-250 MG-UNIT TABS Take 600 mg by mouth.    celecoxib  (CELEBREX ) 200 MG capsule Take 1 capsule (200 mg total) by mouth every other day.   Cholecalciferol (VITAMIN D -3) 1000 UNITS CAPS Take by mouth daily.   Coenzyme Q10 (CO Q-10) 100 MG CAPS Take 200 mg by mouth daily.    meclizine (ANTIVERT) 25 MG tablet Take by mouth.   metoprolol tartrate (LOPRESSOR) 25 MG tablet Take 25 mg by mouth 2 (two) times daily.   omeprazole  (PRILOSEC) 40 MG capsule TAKE ONE CAPSULE BY MOUTH EVERY DAY   oxybutynin  (DITROPAN  XL) 15 MG 24 hr tablet Take 1  tablet (15 mg total) by mouth at bedtime.   telmisartan (MICARDIS) 80 MG tablet    zolpidem  (AMBIEN ) 10 MG tablet Take 1 tablet (10 mg total) by mouth at bedtime as needed. for sleep   [DISCONTINUED] apixaban  (ELIQUIS ) 5 MG TABS tablet Take 1 tablet (5 mg total) by mouth 2 (two) times daily.   [DISCONTINUED] atorvastatin  (LIPITOR) 10 MG tablet TAKE ONE TABLET BY MOUTH ONCE DAILY   [DISCONTINUED] celecoxib  (CELEBREX ) 200 MG capsule Take 1 capsule (200 mg total) by mouth daily.   [DISCONTINUED] oxybutynin  (DITROPAN  XL) 15 MG 24 hr tablet TAKE ONE TABLET BY MOUTH NIGHTLY AT BEDTIME   [DISCONTINUED] zolpidem  (AMBIEN ) 10 MG tablet TAKE 1 TABLET BY MOUTH AT BEDTIME AS NEEDED FOR SLEEP   No facility-administered encounter medications on file as of 12/12/2023.    Surgical History: Past Surgical History:  Procedure Laterality Date   ABDOMINAL HYSTERECTOMY     CARDIOVERSION  2009, 2010   CHOLECYSTECTOMY     COLONOSCOPY  2010,02/2014   Dr. Dellie, [facility]   COLONOSCOPY WITH PROPOFOL  N/A 11/14/2019   Procedure: COLONOSCOPY WITH PROPOFOL ;  Surgeon: Dessa Reyes ORN, MD;  Location: [facility] ENDOSCOPY;  Service: Endoscopy;  Laterality: N/A;   HEMORRHOID SURGERY  09-06-2007   stapled   INSERT / REPLACE / REMOVE PACEMAKER     PACEMAKER INSERTION Left 09/2011   SALPINGOOPHORECTOMY  1974    Medical History: Past Medical History:  Diagnosis Date   Atrial  fibrillation (HCC) 2016   Atrial flutter (HCC) 2009   Breast screening, unspecified    Diffuse cystic mastopathy    FCD   Family history of malignant neoplasm of gastrointestinal tract    Heart disease    Hemorrhoids 2009   resolved   Hiatal hernia    Hyperlipidemia    Obesity, unspecified    Osteoarthritis    Osteoporosis    Personal history of tobacco use, presenting hazards to health    Screening for obesity    Sleep apnea    admits to c-pap   Special screening for malignant neoplasms, colon    Unspecified essential hypertension 2002     Family History: Family History  Problem Relation Age of Onset   Colon cancer Father    Colon cancer Sister    Heart disease Sister    Colon cancer Sister    Stroke Mother    Colon cancer Nephew     Social History   Socioeconomic History   Marital status: Married    Spouse name: Not on file   Number of children: Not on file   Years of education: Not on file   Highest education level: Not on file  Occupational History   Not on file  Tobacco Use   Smoking status: Former    Current packs/day: 0.00    Average packs/day: 1 pack/day for 20.0 years (20.0 ttl pk-yrs)    Types: Cigarettes    Start date: 05/16/1971    Quit date: 05/16/1991    Years since quitting: 32.5   Smokeless tobacco: Never  Vaping Use   Vaping status: Never Used  Substance and Sexual Activity   Alcohol use: Yes    Comment: rarely   Drug use: No   Sexual activity: Not on file  Other Topics Concern   Not on file  Social History Narrative   Not on file   Social Drivers of Health   Financial Resource Strain: Not on file  Food Insecurity: Not on file  Transportation Needs: Not on file  Physical Activity: Not on file  Stress: Not on file  Social Connections: Not on file  Intimate Partner Violence: Not on file      Review of Systems  Constitutional:  Negative for chills, fatigue and unexpected weight change.  HENT:  Negative for congestion, rhinorrhea, sneezing and sore throat.   Eyes:  Negative for redness.  Respiratory:  Negative for cough, chest tightness and shortness of breath.   Cardiovascular:  Negative for chest pain and palpitations.  Gastrointestinal:  Negative for abdominal pain, constipation, diarrhea, nausea and vomiting.  Genitourinary:  Negative for dysuria and frequency.  Musculoskeletal:  Negative for arthralgias, back pain, joint swelling and neck pain.  Skin:  Negative for rash.  Neurological: Negative.  Negative for tremors and numbness.  Hematological:  Negative for  adenopathy. Does not bruise/bleed easily.  Psychiatric/Behavioral:  Negative for behavioral problems (Depression), sleep disturbance and suicidal ideas. The patient is not nervous/anxious.     Vital Signs: BP 138/76   Pulse 75   Temp (!) 97.3 F (36.3 C)   Resp 16   Ht 5' 6 (1.676 m)   Wt 197 lb 12.8 oz (89.7 kg)   SpO2 93%   BMI 31.93 kg/m    Physical Exam Vitals reviewed.  Constitutional:      General: She is not in acute distress.    Appearance: Normal appearance. She is well-developed. She is obese. She is not ill-appearing or diaphoretic.  HENT:     Head: Normocephalic and atraumatic.  Eyes:     Pupils: Pupils are equal, round, and reactive to light.  Neck:     Thyroid : No thyromegaly.     Vascular: No JVD.     Trachea: No tracheal deviation.  Cardiovascular:     Rate and Rhythm: Normal rate and regular rhythm.  Pulmonary:     Effort: Pulmonary effort is normal. No respiratory distress.  Neurological:     Mental Status: She is alert and oriented to person, place, and time.  Psychiatric:        Mood and Affect: Mood normal.        Behavior: Behavior normal.        Assessment/Plan: 1. Essential hypertension (Primary) Stable, continue amlodipine , metoprolol and telmisartan as prescribed.  - CBC with Differential/Platelet - CMP14+EGFR - Lipid Profile - TSH + free T4 - Hgb A1C w/o eAG - Vitamin D  (25 hydroxy)  2. Paroxysmal atrial fibrillation (HCC) Continue eliquis  as prescribed. Routine labs ordered  - apixaban  (ELIQUIS ) 5 MG TABS tablet; Take 1 tablet by mouth 2 (two) times daily. - CBC with Differential/Platelet - CMP14+EGFR - Lipid Profile - TSH + free T4 - Hgb A1C w/o eAG - Vitamin D  (25 hydroxy)  3. Mixed hyperlipidemia Continue atorvastatin  as prescribed. Routine labs ordered - atorvastatin  (LIPITOR) 10 MG tablet; Take 1 tablet (10 mg total) by mouth daily.  Dispense: 90 tablet; Refill: 1 - CBC with Differential/Platelet - CMP14+EGFR -  Lipid Profile - TSH + free T4 - Hgb A1C w/o eAG - Vitamin D  (25 hydroxy)  4. Prediabetes Routine labs ordered  - CBC with Differential/Platelet - CMP14+EGFR - Lipid Profile - TSH + free T4 - Hgb A1C w/o eAG - Vitamin D  (25 hydroxy)  5. Stress incontinence (female) (female) Continue oxybutynin  as prescribed.  - oxybutynin  (DITROPAN  XL) 15 MG 24 hr tablet; Take 1 tablet (15 mg total) by mouth at bedtime.  Dispense: 90 tablet; Refill: 1  6. Primary osteoarthritis, unspecified site Continue celebrex  as prescribed  - celecoxib  (CELEBREX ) 200 MG capsule; Take 1 capsule (200 mg total) by mouth every other day.  Dispense: 45 capsule; Refill: 3  7. Other fatigue Routine labs ordered  - CBC with Differential/Platelet - CMP14+EGFR - Lipid Profile - TSH + free T4 - Hgb A1C w/o eAG - Vitamin D  (25 hydroxy)  8. Vitamin D  deficiency Routine lab ordered  - Vitamin D  (25 hydroxy)  9. Primary insomnia Continue prn ambien  as prescribed  - zolpidem  (AMBIEN ) 10 MG tablet; Take 1 tablet (10 mg total) by mouth at bedtime as needed. for sleep  Dispense: 30 tablet; Refill: 1   General Counseling: Annett verbalizes understanding of the findings of todays visit and agrees with plan of treatment. I have discussed any further diagnostic evaluation that may be needed or ordered today. We also reviewed her medications today. she has been encouraged to call the office with any questions or concerns that should arise related to todays visit.    Orders Placed This Encounter  Procedures   CBC with Differential/Platelet   CMP14+EGFR   Lipid Profile   TSH + free T4   Hgb A1C w/o eAG   Vitamin D  (25 hydroxy)    Meds ordered this encounter  Medications   atorvastatin  (LIPITOR) 10 MG tablet    Sig: Take 1 tablet (10 mg total) by mouth daily.    Dispense:  90 tablet    Refill:  1  For future refills   celecoxib  (CELEBREX ) 200 MG capsule    Sig: Take 1 capsule (200 mg total) by mouth every other  day.    Dispense:  45 capsule    Refill:  3    For future refills   oxybutynin  (DITROPAN  XL) 15 MG 24 hr tablet    Sig: Take 1 tablet (15 mg total) by mouth at bedtime.    Dispense:  90 tablet    Refill:  1    For future refills   zolpidem  (AMBIEN ) 10 MG tablet    Sig: Take 1 tablet (10 mg total) by mouth at bedtime as needed. for sleep    Dispense:  30 tablet    Refill:  1    Do not fill this prescription until patient calls to request it, keep refills on file.    Return for due for AWV on or after 06/13/24, needs to be scheduled. .   Total time spent:30 Minutes Time spent includes review of chart, medications, test results, and follow up plan with the patient.   [place] Controlled Substance Database was reviewed by me.  This patient was seen by Mardy Maxin, FNP-C in collaboration with Dr. Sigrid Bathe as a part of collaborative care agreement.   Lamiyah Schlotter R. Maxin, MSN, FNP-C Internal medicine

## 2024-01-09 DIAGNOSIS — I480 Paroxysmal atrial fibrillation: Secondary | ICD-10-CM | POA: Diagnosis not present

## 2024-01-09 DIAGNOSIS — Z45010 Encounter for checking and testing of cardiac pacemaker pulse generator [battery]: Secondary | ICD-10-CM | POA: Diagnosis not present

## 2024-01-21 ENCOUNTER — Encounter: Payer: Self-pay | Admitting: Internal Medicine

## 2024-01-21 ENCOUNTER — Ambulatory Visit: Admitting: Internal Medicine

## 2024-01-21 VITALS — BP 120/68 | HR 65 | Temp 98.3°F | Resp 16 | Ht 66.0 in | Wt 201.8 lb

## 2024-01-21 DIAGNOSIS — G4733 Obstructive sleep apnea (adult) (pediatric): Secondary | ICD-10-CM | POA: Diagnosis not present

## 2024-01-21 DIAGNOSIS — I480 Paroxysmal atrial fibrillation: Secondary | ICD-10-CM | POA: Diagnosis not present

## 2024-01-21 NOTE — Progress Notes (Signed)
 [facility] [address] [place], [place] [address]  Pulmonary Sleep Medicine   Office Visit Note  Patient Name: Cathy Curtis DOB: 09-May-1941 MRN [id number]  Date of Service: 01/21/2024  Complaints/HPI: She has done well with the machine has an 80% compliance and she states that she has had some issues but is going to try to work on improving her compliance. She has had issues with migraines and is seeing neuro for this. Patient may also have vertigo and states there has been slow improvement. In addition she needs a new battery for her pacer  Office Spirometry Results:     ROS  General: (-) fever, (-) chills, (-) night sweats, (-) weakness Skin: (-) rashes, (-) itching,. Eyes: (-) visual changes, (-) redness, (-) itching. Nose and Sinuses: (-) nasal stuffiness or itchiness, (-) postnasal drip, (-) nosebleeds, (-) sinus trouble. Mouth and Throat: (-) sore throat, (-) hoarseness. Neck: (-) swollen glands, (-) enlarged thyroid , (-) neck pain. Respiratory: - cough, (-) bloody sputum, - shortness of breath, - wheezing. Cardiovascular: - ankle swelling, (-) chest pain. Lymphatic: (-) lymph node enlargement. Neurologic: (-) numbness, (-) tingling. Psychiatric: (-) anxiety, (-) depression   Current Medication: Outpatient Encounter Medications as of 01/21/2024  Medication Sig   acetaminophen (TYLENOL) 500 MG tablet Take 500 mg by mouth every 6 (six) hours as needed.   amLODipine  (NORVASC ) 5 MG tablet Take 1 tablet (5 mg total) by mouth daily.   apixaban  (ELIQUIS ) 5 MG TABS tablet Take 1 tablet by mouth 2 (two) times daily.   atorvastatin  (LIPITOR) 10 MG tablet Take 1 tablet (10 mg total) by mouth daily.   Calcium  Citrate-Vitamin D  200-250 MG-UNIT TABS Take 600 mg by mouth.    celecoxib  (CELEBREX ) 200 MG capsule Take 1 capsule (200 mg total) by mouth every other day.   Cholecalciferol (VITAMIN D -3) 1000 UNITS CAPS Take by mouth daily.   Coenzyme Q10 (CO Q-10) 100 MG CAPS  Take 200 mg by mouth daily.    meclizine (ANTIVERT) 25 MG tablet Take by mouth.   metoprolol tartrate (LOPRESSOR) 25 MG tablet Take 25 mg by mouth 2 (two) times daily.   omeprazole  (PRILOSEC) 40 MG capsule TAKE ONE CAPSULE BY MOUTH EVERY DAY   oxybutynin  (DITROPAN  XL) 15 MG 24 hr tablet Take 1 tablet (15 mg total) by mouth at bedtime.   telmisartan (MICARDIS) 80 MG tablet    zolpidem  (AMBIEN ) 10 MG tablet Take 1 tablet (10 mg total) by mouth at bedtime as needed. for sleep   No facility-administered encounter medications on file as of 01/21/2024.    Surgical History: Past Surgical History:  Procedure Laterality Date   ABDOMINAL HYSTERECTOMY     CARDIOVERSION  2009, 2010   CHOLECYSTECTOMY     COLONOSCOPY  2010,02/2014   Dr. Dellie, [facility]   COLONOSCOPY WITH PROPOFOL  N/A 11/14/2019   Procedure: COLONOSCOPY WITH PROPOFOL ;  Surgeon: Dessa Reyes ORN, MD;  Location: [facility] ENDOSCOPY;  Service: Endoscopy;  Laterality: N/A;   HEMORRHOID SURGERY  09-06-2007   stapled   INSERT / REPLACE / REMOVE PACEMAKER     PACEMAKER INSERTION Left 09/2011   SALPINGOOPHORECTOMY  1974    Medical History: Past Medical History:  Diagnosis Date   Atrial fibrillation (HCC) 2016   Atrial flutter (HCC) 2009   Breast screening, unspecified    Diffuse cystic mastopathy    FCD   Family history of malignant neoplasm of gastrointestinal tract    Heart disease    Hemorrhoids 2009  resolved   Hiatal hernia    Hyperlipidemia    Obesity, unspecified    Osteoarthritis    Osteoporosis    Personal history of tobacco use, presenting hazards to health    Screening for obesity    Sleep apnea    admits to c-pap   Special screening for malignant neoplasms, colon    Unspecified essential hypertension 2002    Family History: Family History  Problem Relation Age of Onset   Colon cancer Father    Colon cancer Sister    Heart disease Sister    Colon cancer Sister    Stroke Mother    Colon cancer Nephew      Social History: Social History   Socioeconomic History   Marital status: Married    Spouse name: Not on file   Number of children: Not on file   Years of education: Not on file   Highest education level: Not on file  Occupational History   Not on file  Tobacco Use   Smoking status: Former    Current packs/day: 0.00    Average packs/day: 1 pack/day for 20.0 years (20.0 ttl pk-yrs)    Types: Cigarettes    Start date: 05/16/1971    Quit date: 05/16/1991    Years since quitting: 32.7   Smokeless tobacco: Never  Vaping Use   Vaping status: Never Used  Substance and Sexual Activity   Alcohol use: Yes    Comment: rarely   Drug use: No   Sexual activity: Not on file  Other Topics Concern   Not on file  Social History Narrative   Not on file   Social Drivers of Health   Financial Resource Strain: Not on file  Food Insecurity: Not on file  Transportation Needs: Not on file  Physical Activity: Not on file  Stress: Not on file  Social Connections: Not on file  Intimate Partner Violence: Not on file    Vital Signs: Blood pressure 120/68, pulse 65, temperature 98.3 F (36.8 C), resp. rate 16, height 5' 6 (1.676 m), weight 201 lb 12.8 oz (91.5 kg), SpO2 92%.  Examination: General Appearance: The patient is well-developed, well-nourished, and in no distress. Skin: Gross inspection of skin unremarkable. Head: normocephalic, no gross deformities. Eyes: no gross deformities noted. ENT: ears appear grossly normal no exudates. Neck: Supple. No thyromegaly. No LAD. Respiratory: no rhonchi noted. Cardiovascular: Normal S1 and S2 without murmur or rub. Extremities: No cyanosis. pulses are equal. Neurologic: Alert and oriented. No involuntary movements.  LABS: No results found for this or any previous visit (from the past 2160 hours).  Radiology: CT HEAD WO CONTRAST ( ) Result Date: 11/20/2023 CLINICAL DATA:  Headache, increasing frequency or severity Headache, new onset  (Age >= 51y) EXAM: CT HEAD WITHOUT CONTRAST TECHNIQUE: Contiguous axial images were obtained from the base of the skull through the vertex without intravenous contrast. RADIATION DOSE REDUCTION: This exam was performed according to the departmental dose-optimization program which includes automated exposure control, adjustment of the mA and/or kV according to patient size and/or use of iterative reconstruction technique. COMPARISON:  None Available. FINDINGS: Brain: Patchy and confluent areas of decreased attenuation are noted throughout the deep and periventricular white matter of the cerebral hemispheres bilaterally, compatible with chronic microvascular ischemic disease. No evidence of large-territorial acute infarction. No parenchymal hemorrhage. No mass lesion. No extra-axial collection. No mass effect or midline shift. No hydrocephalus. Basilar cisterns are patent. Vascular: No hyperdense vessel. Atherosclerotic calcifications are present within the cavernous  internal carotid arteries. Skull: No acute fracture or focal lesion. Sinuses/Orbits: Paranasal sinuses and mastoid air cells are clear. The orbits are unremarkable. Other: None. IMPRESSION: No acute intracranial abnormality. Electronically Signed   By: Morgane  Naveau M.D.   On: 11/20/2023 12:43    No results found.  No results found.  Assessment and Plan: Patient Active Problem List   Diagnosis Date Noted   Depression, major, single episode, mild (HCC) 11/05/2020   Family history of colon cancer 10/23/2018   Hematuria 07/31/2018   Chronic constipation 07/01/2018   Urinary tract infection with hematuria 06/23/2018   Dysuria 06/23/2018   Encounter for general adult medical examination with abnormal findings 05/05/2018   Stress incontinence (female) (female) 05/05/2018   Primary insomnia 05/05/2018   Paroxysmal atrial fibrillation (HCC) 07/04/2017   GERD (gastroesophageal reflux disease) 05/01/2017   Sick sinus syndrome (HCC) 05/01/2017    Other fatigue 05/01/2017   Moderate mitral insufficiency 11/02/2016   Essential hypertension 03/01/2015   Cardiac pacemaker 03/01/2015   Hyperlipemia, mixed 03/06/2014   OSA on CPAP 03/06/2014   Diffuse cystic mastopathy 08/06/2012   Family history of malignant neoplasm of gastrointestinal tract 08/06/2012    1. OSA (obstructive sleep apnea) (Primary) Overall good compliance noted needs to work on her other issues such as the migraines to improve her compliance  2. Obesity, morbid (HCC) Obesity Counseling: Had a lengthy discussion regarding patients BMI and weight issues. Patient was instructed on portion control as well as increased activity.   3. Paroxysmal atrial fibrillation (HCC) Rate is controlled. Follow with cardiology needs a new battery for pacer    General Counseling: I have discussed the findings of the evaluation and examination with Lolita.  I have also discussed any further diagnostic evaluation thatmay be needed or ordered today. Imunique verbalizes understanding of the findings of todays visit. We also reviewed her medications today and discussed drug interactions and side effects including but not limited excessive drowsiness and altered mental states. We also discussed that there is always a risk not just to her but also people around her. she has been encouraged to call the office with any questions or concerns that should arise related to todays visit.  No orders of the defined types were placed in this encounter.    Time spent: 39  I have personally obtained a history, examined the patient, evaluated laboratory and imaging results, formulated the assessment and plan and placed orders.    Elfreda DELENA Bathe, MD [facility] Pulmonary and Critical Care Sleep medicine

## 2024-01-21 NOTE — Patient Instructions (Signed)

## 2024-02-04 DIAGNOSIS — R0609 Other forms of dyspnea: Secondary | ICD-10-CM | POA: Diagnosis not present

## 2024-02-04 DIAGNOSIS — Z45010 Encounter for checking and testing of cardiac pacemaker pulse generator [battery]: Secondary | ICD-10-CM | POA: Diagnosis not present

## 2024-02-04 DIAGNOSIS — I480 Paroxysmal atrial fibrillation: Secondary | ICD-10-CM | POA: Diagnosis not present

## 2024-02-04 DIAGNOSIS — I495 Sick sinus syndrome: Secondary | ICD-10-CM | POA: Diagnosis not present

## 2024-02-04 DIAGNOSIS — I10 Essential (primary) hypertension: Secondary | ICD-10-CM | POA: Diagnosis not present

## 2024-02-04 DIAGNOSIS — I340 Nonrheumatic mitral (valve) insufficiency: Secondary | ICD-10-CM | POA: Diagnosis not present

## 2024-02-07 ENCOUNTER — Encounter: Payer: Self-pay | Admitting: Cardiology

## 2024-02-07 ENCOUNTER — Ambulatory Visit
Admission: RE | Admit: 2024-02-07 | Discharge: 2024-02-07 | Disposition: A | Discharge status: Home / Self Care | Attending: Cardiology | Admitting: Cardiology

## 2024-02-07 ENCOUNTER — Encounter
Admission: RE | Disposition: A | Discharge status: Home / Self Care | Payer: Self-pay | Source: Home / Self Care | Attending: Cardiology

## 2024-02-07 ENCOUNTER — Other Ambulatory Visit: Payer: Self-pay

## 2024-02-07 DIAGNOSIS — Z45010 Encounter for checking and testing of cardiac pacemaker pulse generator [battery]: Secondary | ICD-10-CM | POA: Diagnosis not present

## 2024-02-07 DIAGNOSIS — I495 Sick sinus syndrome: Secondary | ICD-10-CM | POA: Diagnosis not present

## 2024-02-07 HISTORY — PX: PPM GENERATOR CHANGEOUT: EP1233

## 2024-02-07 SURGERY — PPM GENERATOR CHANGEOUT
Anesthesia: Moderate Sedation

## 2024-02-07 MED ORDER — SODIUM CHLORIDE 0.9 % IV SOLN
80.0000 mg | INTRAVENOUS | Status: DC
  Filled 2024-02-07: qty 2

## 2024-02-07 MED ORDER — FENTANYL CITRATE (PF) 100 MCG/2ML IJ SOLN
INTRAMUSCULAR | Status: AC
  Filled 2024-02-07: qty 2

## 2024-02-07 MED ORDER — CEFAZOLIN SODIUM-DEXTROSE 2-4 GM/100ML-% IV SOLN
INTRAVENOUS | Status: AC
  Filled 2024-02-07: qty 100

## 2024-02-07 MED ORDER — SODIUM CHLORIDE 0.9 % IV SOLN
INTRAVENOUS | Status: DC
  Administered 2024-02-07: 250 mL via INTRAVENOUS

## 2024-02-07 MED ORDER — ONDANSETRON HCL 4 MG/2ML IJ SOLN: 4.0000 mg | Freq: Four times a day (QID) | INTRAMUSCULAR | Status: DC | PRN

## 2024-02-07 MED ORDER — LIDOCAINE HCL 1 % IJ SOLN
INTRAMUSCULAR | Status: AC
  Filled 2024-02-07: qty 60

## 2024-02-07 MED ORDER — HEPARIN (PORCINE) IN NACL 1000-0.9 UT/500ML-% IV SOLN
INTRAVENOUS | Status: AC
  Filled 2024-02-07: qty 500

## 2024-02-07 MED ORDER — MIDAZOLAM HCL 2 MG/2ML IJ SOLN
INTRAMUSCULAR | Status: AC
  Filled 2024-02-07: qty 2

## 2024-02-07 MED ORDER — SODIUM CHLORIDE 0.9 % IV SOLN
INTRAVENOUS | Status: DC | PRN
  Administered 2024-02-07: 80 mg

## 2024-02-07 MED ORDER — ACETAMINOPHEN 325 MG PO TABS: 325.0000 mg | ORAL_TABLET | ORAL | Status: DC | PRN

## 2024-02-07 MED ORDER — CEFAZOLIN SODIUM-DEXTROSE 2-4 GM/100ML-% IV SOLN
2.0000 g | INTRAVENOUS | Status: AC
  Administered 2024-02-07: 2 g via INTRAVENOUS

## 2024-02-07 MED ORDER — FENTANYL CITRATE (PF) 100 MCG/2ML IJ SOLN
INTRAMUSCULAR | Status: DC | PRN
  Administered 2024-02-07: 25 ug via INTRAVENOUS

## 2024-02-07 MED ORDER — LIDOCAINE HCL (PF) 1 % IJ SOLN
INTRAMUSCULAR | Status: DC | PRN
  Administered 2024-02-07: 30 mL

## 2024-02-07 MED ORDER — CEPHALEXIN 500 MG PO CAPS: 500.0000 mg | ORAL_CAPSULE | Freq: Two times a day (BID) | ORAL | 0 refills | Status: AC

## 2024-02-07 MED ORDER — MIDAZOLAM HCL 2 MG/2ML IJ SOLN
INTRAMUSCULAR | Status: DC | PRN
  Administered 2024-02-07: 1 mg via INTRAVENOUS

## 2024-02-07 SURGICAL SUPPLY — 9 items
CABLE SURG 12 DISP A/V CHANNEL (MISCELLANEOUS) IMPLANT
DEVICE DSSCT PLSMBLD 3.0S LGHT (MISCELLANEOUS) IMPLANT
DRAPE INCISE 23X17 STRL (DRAPES) IMPLANT
IPG PACE AZUR XT DR MRI W1DR01 (Pacemaker) IMPLANT
PAD ELECT DEFIB RADIOL ZOLL (MISCELLANEOUS) IMPLANT
POUCH AIGIS-R ANTIBACT PPM MED (Mesh General) IMPLANT
SUT VIC AB 2-0 CT2 27 (SUTURE) IMPLANT
SUT VIC AB 4-0 PS2 18 (SUTURE) IMPLANT
TRAY PACEMAKER INSERTION (PACKS) ×1 IMPLANT

## 2024-02-07 NOTE — Discharge Instructions (Addendum)
 Patient may shower on 02/09/2024.  Patient may remove outer bandage after shower, leave Steri-Strips on.  Resume Eliquis  on 02/08/2024.Pacemaker Battery Change, Care After This sheet gives you information about how to care for yourself after your procedure. Your health care provider may also give you more specific instructions. If you have problems or questions, contact your health care provider. What can I expect after the procedure? After the procedure, it is common to have these symptoms at the site where the pacemaker was inserted: Mild pain or soreness. Slight bruising. Some swelling over the incisions. A slight bump over the skin where the device was placed (if it was implanted in the upper chest area). Sometimes, it is possible to feel the device under the skin. This is normal. Follow these instructions at home: Incision care  Keep the incision clean and dry for 2-3 days after the procedure or as told by your health care provider. It takes several weeks for the incision site to completely heal. Do not remove the bandage (dressing) on your chest until told to do so by your health care provider. Leave stitches (sutures), skin glue, or adhesive strips in place. These skin closures may need to stay in place for 2 weeks or longer. If adhesive strip edges start to loosen and curl up, you may trim the loose edges. Do not remove adhesive strips completely unless your health care provider tells you to do that. You may shower in 2 days Pat the incision area dry with a clean towel. Do not rub the area. This may cause bleeding. Check your incision area every day for signs of infection. Check for: More redness, swelling, or pain. Fluid or blood. Warmth. Pus or a bad smell. Avoid putting pressure on the area where the pacemaker was placed. Women may want to place a small pad over the incision site to protect it from their bra strap. Avoid using lotion on skin until insertion site heals  completely Medicines Take over-the-counter and prescription medicines only as told by your health care provider. If you were prescribed an antibiotic medicine, take it as told by your health care provider. Do not stop taking the antibiotic even if you start to feel better. Activity For the first 2 weeks, or as long as told by your health care provider: Avoid lifting anything heavy Avoid exercise or activities that take a lot of effort. If you were given a medicine to help you relax (sedative) during the procedure, it can affect you for several hours. Do not drive or operate machinery until your health care provider says that it is safe. General instructions Do not use any products that contain nicotine or tobacco, such as cigarettes, e-cigarettes, and chewing tobacco. These can delay incision healing after surgery. If you need help quitting, ask your health care provider. Always let all health care providers, including dentists, know about your pacemaker before you have any medical procedures or tests. You may be shown how to transfer data from your pacemaker through the phone to your health care provider. Wear a medical ID bracelet or necklace stating that you have a pacemaker, and carry a pacemaker ID card with you at all times. Avoid close and prolonged exposure to electrical devices that have strong magnetic fields. These include: Insurance claims handler. When at the airport, let officials know that you have a pacemaker. Carry your pacemaker ID card. Metal detectors. If you must pass through a metal detector, walk through it quickly. Do not stop under the detector  or stand near it. When using your mobile phone, hold it to the ear opposite the pacemaker. Do not leave your mobile phone in a pocket over the pacemaker. Your pacemaker battery will last for 5-15 years. Your health care provider will do routine checks to know when the battery is starting to run down. When this happens, the  pacemaker will need to be replaced. Keep all follow-up visits as told by your health care provider. This is important. Contact a health care provider if: You have pain at the incision site that is not relieved by medicines. You have any of these signs of infection: More redness, swelling, or pain around your incision. Fluid or blood coming from your incision. Warmth coming from your incision. Pus or a bad smell coming from your incision. A fever. You feel brief, occasional palpitations, light-headedness, or any symptoms that you think might be related to your heart. Get help right away if: You have chest pain that is different from the pain at the pacemaker site. You develop a red streak that extends above or below the incision site. You have shortness of breath. You have palpitations or an irregular heartbeat. You have light-headedness that does not go away quickly. You faint or have dizzy spells. Your pulse suddenly drops or increases rapidly and does not return to normal. You gain weight and your legs and ankles swell. Summary After the procedure, it is common to have pain, soreness, and some swelling or bruising where the pacemaker was inserted. Keep your incision clean and dry. Follow instructions from your health care provider about how to take care of your incision. Check your incision every day for signs of infection, such as more pain or swelling, pus or a bad smell, warmth, or leaking fluid or blood. Carry a pacemaker ID card with you at all times. This information is not intended to replace advice given to you by your health care provider. Make sure you discuss any questions you have with your health care provider. Document Revised: 04/03/2019 Document Reviewed: 04/03/2019 [employer]vier Patient Education  2023 [employer].

## 2024-02-09 DIAGNOSIS — R0902 Hypoxemia: Secondary | ICD-10-CM | POA: Diagnosis not present

## 2024-02-09 DIAGNOSIS — G4733 Obstructive sleep apnea (adult) (pediatric): Secondary | ICD-10-CM | POA: Diagnosis not present

## 2024-04-02 DIAGNOSIS — Z950 Presence of cardiac pacemaker: Secondary | ICD-10-CM | POA: Diagnosis not present

## 2024-04-02 DIAGNOSIS — I495 Sick sinus syndrome: Secondary | ICD-10-CM | POA: Diagnosis not present

## 2024-05-08 ENCOUNTER — Other Ambulatory Visit: Payer: Self-pay | Admitting: Nurse Practitioner

## 2024-05-08 DIAGNOSIS — F5101 Primary insomnia: Secondary | ICD-10-CM

## 2024-05-30 LAB — CBC WITH DIFFERENTIAL/PLATELET
Basophils Absolute: 0.1 x10E3/uL (ref 0.0–0.2)
Basos: 1 %
EOS (ABSOLUTE): 0.2 x10E3/uL (ref 0.0–0.4)
Eos: 4 %
Hematocrit: 40.4 % (ref 34.0–46.6)
Hemoglobin: 13.1 g/dL (ref 11.1–15.9)
Immature Grans (Abs): 0 x10E3/uL (ref 0.0–0.1)
Immature Granulocytes: 0 %
Lymphocytes Absolute: 1.8 x10E3/uL (ref 0.7–3.1)
Lymphs: 26 %
MCH: 30.3 pg (ref 26.6–33.0)
MCHC: 32.4 g/dL (ref 31.5–35.7)
MCV: 93 fL (ref 79–97)
Monocytes Absolute: 0.5 x10E3/uL (ref 0.1–0.9)
Monocytes: 7 %
Neutrophils Absolute: 4.3 x10E3/uL (ref 1.4–7.0)
Neutrophils: 62 %
Platelets: 153 x10E3/uL (ref 150–450)
RBC: 4.33 x10E6/uL (ref 3.77–5.28)
RDW: 13.8 % (ref 11.7–15.4)
WBC: 6.9 x10E3/uL (ref 3.4–10.8)

## 2024-05-30 LAB — CMP14+EGFR
ALT: 12 IU/L (ref 0–32)
AST: 17 IU/L (ref 0–40)
Albumin: 4.1 g/dL (ref 3.7–4.7)
Alkaline Phosphatase: 65 IU/L (ref 48–129)
BUN/Creatinine Ratio: 22 (ref 12–28)
BUN: 19 mg/dL (ref 8–27)
Bilirubin Total: 0.6 mg/dL (ref 0.0–1.2)
CO2: 26 mmol/L (ref 20–29)
Calcium: 9.5 mg/dL (ref 8.7–10.3)
Chloride: 105 mmol/L (ref 96–106)
Creatinine, Ser: 0.85 mg/dL (ref 0.57–1.00)
Globulin, Total: 2.9 g/dL (ref 1.5–4.5)
Glucose: 95 mg/dL (ref 70–99)
Potassium: 4.1 mmol/L (ref 3.5–5.2)
Sodium: 142 mmol/L (ref 134–144)
Total Protein: 7 g/dL (ref 6.0–8.5)
eGFR: 69 mL/min/1.73

## 2024-05-30 LAB — LIPID PANEL
Chol/HDL Ratio: 2.6 ratio (ref 0.0–4.4)
Cholesterol, Total: 162 mg/dL (ref 100–199)
HDL: 62 mg/dL
LDL Chol Calc (NIH): 81 mg/dL (ref 0–99)
Triglycerides: 105 mg/dL (ref 0–149)
VLDL Cholesterol Cal: 19 mg/dL (ref 5–40)

## 2024-05-30 LAB — VITAMIN D 25 HYDROXY (VIT D DEFICIENCY, FRACTURES): Vit D, 25-Hydroxy: 53.8 ng/mL (ref 30.0–100.0)

## 2024-05-30 LAB — TSH+FREE T4
Free T4: 1.02 ng/dL (ref 0.82–1.77)
TSH: 1.2 u[IU]/mL (ref 0.450–4.500)

## 2024-05-30 LAB — HGB A1C W/O EAG: Hgb A1c MFr Bld: 5.9 % — ABNORMAL HIGH (ref 4.8–5.6)

## 2024-06-10 ENCOUNTER — Ambulatory Visit: Payer: Self-pay | Admitting: Nurse Practitioner

## 2024-06-10 NOTE — Progress Notes (Signed)
 Lab results reviewed by provider and will be discussed in detail with patient at her visit later this week

## 2024-06-13 ENCOUNTER — Ambulatory Visit: Admitting: Nurse Practitioner

## 2024-06-24 ENCOUNTER — Ambulatory Visit: Admitting: Nurse Practitioner

## 2025-01-26 ENCOUNTER — Ambulatory Visit: Admitting: Internal Medicine
